# Patient Record
Sex: Female | Born: 1970 | Race: Black or African American | Hispanic: No | Marital: Married | Smoking: Never smoker
Health system: Southern US, Community
[De-identification: ages and names within clinical notes are randomized; demographics above are authoritative.]

## PROBLEM LIST (undated history)

## (undated) DIAGNOSIS — Z803 Family history of malignant neoplasm of breast: Secondary | ICD-10-CM

## (undated) DIAGNOSIS — R519 Headache, unspecified: Secondary | ICD-10-CM

## (undated) DIAGNOSIS — E785 Hyperlipidemia, unspecified: Secondary | ICD-10-CM

## (undated) DIAGNOSIS — Z800 Family history of malignant neoplasm of digestive organs: Secondary | ICD-10-CM

## (undated) DIAGNOSIS — R51 Headache: Secondary | ICD-10-CM

## (undated) DIAGNOSIS — G8929 Other chronic pain: Secondary | ICD-10-CM

## (undated) DIAGNOSIS — R059 Cough, unspecified: Secondary | ICD-10-CM

## (undated) DIAGNOSIS — D649 Anemia, unspecified: Secondary | ICD-10-CM

## (undated) DIAGNOSIS — N949 Unspecified condition associated with female genital organs and menstrual cycle: Secondary | ICD-10-CM

## (undated) DIAGNOSIS — M545 Low back pain, unspecified: Secondary | ICD-10-CM

## (undated) DIAGNOSIS — J309 Allergic rhinitis, unspecified: Secondary | ICD-10-CM

## (undated) DIAGNOSIS — R10811 Right upper quadrant abdominal tenderness: Secondary | ICD-10-CM

## (undated) DIAGNOSIS — R609 Edema, unspecified: Secondary | ICD-10-CM

## (undated) DIAGNOSIS — IMO0002 Reserved for concepts with insufficient information to code with codable children: Secondary | ICD-10-CM

## (undated) DIAGNOSIS — R87619 Unspecified abnormal cytological findings in specimens from cervix uteri: Secondary | ICD-10-CM

## (undated) DIAGNOSIS — E559 Vitamin D deficiency, unspecified: Secondary | ICD-10-CM

## (undated) DIAGNOSIS — R05 Cough: Secondary | ICD-10-CM

## (undated) HISTORY — DX: Edema, unspecified: R60.9

## (undated) HISTORY — PX: TUBAL LIGATION: SHX77

## (undated) HISTORY — DX: Family history of malignant neoplasm of breast: Z80.3

## (undated) HISTORY — DX: Right upper quadrant abdominal tenderness: R10.811

## (undated) HISTORY — PX: VAGINAL HYSTERECTOMY: SUR661

## (undated) HISTORY — DX: Allergic rhinitis, unspecified: J30.9

## (undated) HISTORY — DX: Headache: R51

## (undated) HISTORY — DX: Low back pain, unspecified: M54.50

## (undated) HISTORY — DX: Reserved for concepts with insufficient information to code with codable children: IMO0002

## (undated) HISTORY — DX: Anemia, unspecified: D64.9

## (undated) HISTORY — DX: Family history of malignant neoplasm of digestive organs: Z80.0

## (undated) HISTORY — DX: Cough: R05

## (undated) HISTORY — PX: BREAST LUMPECTOMY: SHX2

## (undated) HISTORY — DX: Vitamin D deficiency, unspecified: E55.9

## (undated) HISTORY — DX: Low back pain: M54.5

## (undated) HISTORY — PX: BREAST EXCISIONAL BIOPSY: SUR124

## (undated) HISTORY — DX: Other chronic pain: G89.29

## (undated) HISTORY — DX: Unspecified abnormal cytological findings in specimens from cervix uteri: R87.619

## (undated) HISTORY — DX: Hyperlipidemia, unspecified: E78.5

## (undated) HISTORY — DX: Headache, unspecified: R51.9

## (undated) HISTORY — DX: Cough, unspecified: R05.9

## (undated) HISTORY — DX: Unspecified condition associated with female genital organs and menstrual cycle: N94.9

---

## 1997-12-08 ENCOUNTER — Inpatient Hospital Stay (HOSPITAL_COMMUNITY): Admission: AD | Admit: 1997-12-08 | Discharge: 1997-12-10 | Payer: Self-pay | Admitting: *Deleted

## 2004-11-03 ENCOUNTER — Ambulatory Visit: Payer: Self-pay | Admitting: Family Medicine

## 2006-07-12 ENCOUNTER — Ambulatory Visit: Payer: Self-pay

## 2009-10-12 ENCOUNTER — Encounter: Payer: Self-pay | Admitting: Sports Medicine

## 2009-11-10 ENCOUNTER — Encounter: Payer: Self-pay | Admitting: Sports Medicine

## 2010-04-13 ENCOUNTER — Ambulatory Visit: Payer: Self-pay | Admitting: Family Medicine

## 2010-07-14 ENCOUNTER — Ambulatory Visit: Payer: Self-pay | Admitting: Family Medicine

## 2010-10-17 ENCOUNTER — Encounter: Payer: Self-pay | Admitting: Podiatry

## 2011-05-24 ENCOUNTER — Ambulatory Visit: Payer: Self-pay | Admitting: General Surgery

## 2011-05-26 ENCOUNTER — Ambulatory Visit: Payer: Self-pay | Admitting: General Surgery

## 2011-09-21 ENCOUNTER — Emergency Department: Payer: Self-pay | Admitting: Internal Medicine

## 2011-09-21 LAB — CK TOTAL AND CKMB (NOT AT ARMC): CK-MB: 1.8 ng/mL (ref 0.5–3.6)

## 2011-09-21 LAB — COMPREHENSIVE METABOLIC PANEL
Alkaline Phosphatase: 59 U/L (ref 50–136)
Anion Gap: 7 (ref 7–16)
BUN: 11 mg/dL (ref 7–18)
Bilirubin,Total: 0.9 mg/dL (ref 0.2–1.0)
Calcium, Total: 8.6 mg/dL (ref 8.5–10.1)
Creatinine: 0.72 mg/dL (ref 0.60–1.30)
Glucose: 75 mg/dL (ref 65–99)
SGOT(AST): 22 U/L (ref 15–37)
Total Protein: 6.8 g/dL (ref 6.4–8.2)

## 2011-09-21 LAB — CBC
HCT: 39.4 % (ref 35.0–47.0)
MCH: 24 pg — ABNORMAL LOW (ref 26.0–34.0)
MCV: 76 fL — ABNORMAL LOW (ref 80–100)
RBC: 5.18 10*6/uL (ref 3.80–5.20)
WBC: 4.2 10*3/uL (ref 3.6–11.0)

## 2011-09-21 LAB — URINALYSIS, COMPLETE
Bacteria: NONE SEEN
Ketone: NEGATIVE
Ph: 7 (ref 4.5–8.0)
Protein: NEGATIVE
RBC,UR: 2 /HPF (ref 0–5)
Squamous Epithelial: 4

## 2011-09-21 LAB — TROPONIN I: Troponin-I: <0.02 ng/mL

## 2011-09-29 ENCOUNTER — Ambulatory Visit: Payer: Self-pay | Admitting: Orthopedic Surgery

## 2012-08-27 ENCOUNTER — Ambulatory Visit: Payer: Self-pay | Admitting: Family Medicine

## 2012-10-11 ENCOUNTER — Emergency Department: Payer: Self-pay | Admitting: Emergency Medicine

## 2012-10-11 LAB — CBC
HCT: 39.6 % (ref 35.0–47.0)
MCH: 23.5 pg — ABNORMAL LOW (ref 26.0–34.0)
MCV: 73 fL — ABNORMAL LOW (ref 80–100)
Platelet: 184 10*3/uL (ref 150–440)
RDW: 14.9 % — ABNORMAL HIGH (ref 11.5–14.5)
WBC: 4.3 10*3/uL (ref 3.6–11.0)

## 2012-10-11 LAB — COMPREHENSIVE METABOLIC PANEL
Albumin: 3.5 g/dL (ref 3.4–5.0)
Alkaline Phosphatase: 65 U/L (ref 50–136)
BUN: 7 mg/dL (ref 7–18)
Bilirubin,Total: 0.9 mg/dL (ref 0.2–1.0)
Calcium, Total: 8.5 mg/dL (ref 8.5–10.1)
Co2: 30 mmol/L (ref 21–32)
EGFR (African American): >60
Glucose: 96 mg/dL (ref 65–99)
Osmolality: 274 (ref 275–301)
SGOT(AST): 27 U/L (ref 15–37)
Sodium: 138 mmol/L (ref 136–145)
Total Protein: 6.9 g/dL (ref 6.4–8.2)

## 2012-10-11 LAB — URINALYSIS, COMPLETE
Bacteria: NONE SEEN
Blood: NEGATIVE
Ketone: NEGATIVE
Ph: 5 (ref 4.5–8.0)
RBC,UR: <1 /HPF (ref 0–5)
Squamous Epithelial: 1
WBC UR: NONE SEEN /HPF (ref 0–5)

## 2012-10-11 LAB — TROPONIN I: Troponin-I: <0.02 ng/mL

## 2012-10-11 LAB — TSH: Thyroid Stimulating Horm: 0.992 u[IU]/mL

## 2013-04-30 ENCOUNTER — Ambulatory Visit: Payer: Self-pay | Admitting: Family Medicine

## 2013-05-13 ENCOUNTER — Emergency Department: Payer: Self-pay | Admitting: Emergency Medicine

## 2013-05-13 LAB — BASIC METABOLIC PANEL
Anion Gap: 5 — ABNORMAL LOW (ref 7–16)
BUN: 9 mg/dL (ref 7–18)
Chloride: 107 mmol/L (ref 98–107)
Creatinine: 0.86 mg/dL (ref 0.60–1.30)
EGFR (African American): >60
EGFR (Non-African Amer.): >60
Glucose: 96 mg/dL (ref 65–99)
Osmolality: 276 (ref 275–301)
Potassium: 3.8 mmol/L (ref 3.5–5.1)
Sodium: 139 mmol/L (ref 136–145)

## 2013-05-13 LAB — CBC
HCT: 37.5 % (ref 35.0–47.0)
HGB: 12.1 g/dL (ref 12.0–16.0)
MCH: 23.8 pg — ABNORMAL LOW (ref 26.0–34.0)
MCHC: 32.3 g/dL (ref 32.0–36.0)
MCV: 74 fL — ABNORMAL LOW (ref 80–100)
RDW: 15.5 % — ABNORMAL HIGH (ref 11.5–14.5)
WBC: 6.9 10*3/uL (ref 3.6–11.0)

## 2013-05-13 LAB — URINALYSIS, COMPLETE
Bacteria: NONE SEEN
Bilirubin,UR: NEGATIVE
Glucose,UR: NEGATIVE mg/dL (ref 0–75)
Leukocyte Esterase: NEGATIVE
Ph: 7 (ref 4.5–8.0)
Protein: NEGATIVE
RBC,UR: 5 /HPF (ref 0–5)
Specific Gravity: 1.006 (ref 1.003–1.030)
WBC UR: 1 /HPF (ref 0–5)

## 2013-05-20 ENCOUNTER — Ambulatory Visit: Payer: Self-pay | Admitting: Cardiovascular Disease

## 2013-05-27 ENCOUNTER — Ambulatory Visit: Payer: Self-pay | Admitting: Cardiovascular Disease

## 2013-06-18 ENCOUNTER — Encounter: Payer: Self-pay | Admitting: *Deleted

## 2013-06-24 ENCOUNTER — Encounter (INDEPENDENT_AMBULATORY_CARE_PROVIDER_SITE_OTHER): Payer: Self-pay

## 2013-06-24 ENCOUNTER — Encounter: Payer: Self-pay | Admitting: Cardiovascular Disease

## 2013-06-24 ENCOUNTER — Ambulatory Visit (INDEPENDENT_AMBULATORY_CARE_PROVIDER_SITE_OTHER): Payer: 59 | Admitting: Cardiovascular Disease

## 2013-06-24 VITALS — BP 137/86 | HR 86 | Ht 66.0 in | Wt 195.5 lb

## 2013-06-24 DIAGNOSIS — R0789 Other chest pain: Secondary | ICD-10-CM

## 2013-06-24 HISTORY — DX: Other chest pain: R07.89

## 2013-06-24 NOTE — Patient Instructions (Signed)
Follow up as needed

## 2013-06-24 NOTE — Progress Notes (Signed)
Primary care physician: Dr. Sherryll Burger.   HPI  This is a pleasant 43 year old African American female who was referred for evaluation of chest pain. She has no previous cardiac history. She has no history of hypertension, diabetes or hyperlipidemia. She is a lifelong nonsmoker. There is no family history of premature coronary artery disease. She had one episode of chest discomfort on December 2. It started in the substernal area and described as gas and burning sensation. She became anxious and sweaty. Her mother was extremely concerned about this. It lasted for a few hours. This happened after she ate a spicy and greasy meal. She went to the emergency room at [facility]. Chest pain resolved without intervention. Labs were unremarkable including negative cardiac enzymes. Chest x-ray showed no acute abnormalities. EKG was within normal limits. She was discharged home. She reports no further episodes of chest discomfort since then. She has been trying to increase her physical activities. She works at [employer].    Allergies  Allergen Reactions  . Erythromycin   . Penicillins   . Vibramycin [Doxycycline Calcium]      Current Outpatient Prescriptions on File Prior to Visit  Medication Sig Dispense Refill  . Vitamin D, Ergocalciferol, (DRISDOL) 50000 UNITS CAPS capsule Take 50,000 Units by mouth every 7 (seven) days.       No current facility-administered medications on file prior to visit.     Past Medical History  Diagnosis Date  . Swelling   . Anemia   . Chronic headaches   . Abdominal tenderness, right upper quadrant   . Cough   . Unspecified symptom associated with female genital organs   . Lumbago   . Unspecified vitamin D deficiency   . Thoracic or lumbosacral neuritis or radiculitis, unspecified   . Allergic rhinitis, cause unspecified   . Other and unspecified hyperlipidemia      Past Surgical History  Procedure Laterality Date  . Vaginal hysterectomy    . Tubal  ligation    . Breast lumpectomy Left      Family History  Problem Relation Age of Onset  . Stroke Mother   . Hypertension Mother   . Heart murmur Mother   . Hypertension Father   . Breast cancer Maternal Aunt      History   Social History  . Marital Status: Single    Spouse Name: N/A    Number of Children: N/A  . Years of Education: N/A   Occupational History  . Not on file.   Social History Main Topics  . Smoking status: Never Smoker   . Smokeless tobacco: Not on file  . Alcohol Use: No  . Drug Use: No  . Sexual Activity: Not on file   Other Topics Concern  . Not on file   Social History Narrative  . No narrative on file     ROS A 10 point review of system was performed. It is negative other than that mentioned in the history of present illness.   PHYSICAL EXAM   BP 137/86  Pulse 86  Ht 5\' 6"  (1.676 m)  Wt 195 lb 8 oz (88.678 kg)  BMI 31.57 kg/m2 Constitutional: She is oriented to person, place, and time. She appears well-developed and well-nourished. No distress.  HENT: No nasal discharge.  Head: Normocephalic and atraumatic.  Eyes: Pupils are equal and round. No discharge.  Neck: Normal range of motion. Neck supple. No JVD present. No thyromegaly present.  Cardiovascular: Normal rate, regular rhythm, normal heart  sounds. Exam reveals no gallop and no friction rub. No murmur heard.  Pulmonary/Chest: Effort normal and breath sounds normal. No stridor. No respiratory distress. She has no wheezes. She has no rales. She exhibits no tenderness.  Abdominal: Soft. Bowel sounds are normal. She exhibits no distension. There is no tenderness. There is no rebound and no guarding.  Musculoskeletal: Normal range of motion. She exhibits no edema and no tenderness.  Neurological: She is alert and oriented to person, place, and time. Coordination normal.  Skin: Skin is warm and dry. No rash noted. She is not diaphoretic. No erythema. No pallor.  Psychiatric: She has  a normal mood and affect. Her behavior is normal. Judgment and thought content normal.        ASSESSMENT AND PLAN

## 2013-06-24 NOTE — Assessment & Plan Note (Signed)
I suspect that her chest pain was likely GI in nature. She had no recurrent episodes even with exertional activities. Cardiac physical exam is unremarkable and baseline ECG is normal. She has no significant risk factors for coronary artery disease. A treadmill stress test can be done if she develops recurrent symptoms. However, her pretest probability for ischemic heart disease is very low. She will notify us if she develops any concerning recurrent symptoms.

## 2013-11-11 ENCOUNTER — Ambulatory Visit: Payer: Self-pay | Admitting: Family Medicine

## 2013-12-12 ENCOUNTER — Encounter: Payer: Self-pay | Admitting: Podiatry

## 2013-12-12 ENCOUNTER — Ambulatory Visit (INDEPENDENT_AMBULATORY_CARE_PROVIDER_SITE_OTHER): Payer: 59

## 2013-12-12 ENCOUNTER — Ambulatory Visit: Payer: 59 | Admitting: Podiatry

## 2013-12-12 ENCOUNTER — Other Ambulatory Visit: Payer: Self-pay | Admitting: *Deleted

## 2013-12-12 VITALS — BP 128/89 | HR 74 | Resp 16 | Ht 66.0 in

## 2013-12-12 DIAGNOSIS — M21619 Bunion of unspecified foot: Secondary | ICD-10-CM

## 2013-12-12 DIAGNOSIS — L600 Ingrowing nail: Secondary | ICD-10-CM

## 2013-12-12 DIAGNOSIS — M21611 Bunion of right foot: Secondary | ICD-10-CM

## 2013-12-12 DIAGNOSIS — B351 Tinea unguium: Secondary | ICD-10-CM

## 2013-12-12 DIAGNOSIS — M779 Enthesopathy, unspecified: Secondary | ICD-10-CM

## 2013-12-12 MED ORDER — TRIAMCINOLONE ACETONIDE 10 MG/ML IJ SUSP
10.0000 mg | Freq: Once | INTRAMUSCULAR | Status: AC
  Administered 2013-12-12: 10 mg

## 2013-12-12 MED ORDER — HYDROCODONE-ACETAMINOPHEN 10-325 MG PO TABS: 1.0000 | ORAL_TABLET | Freq: Three times a day (TID) | ORAL | Status: DC | PRN

## 2013-12-12 NOTE — Patient Instructions (Signed)

## 2013-12-12 NOTE — Progress Notes (Signed)
Subjective:     Patient ID: Debra Stephens, female   DOB: April 01, 1971, 43 y.o.   MRN: [id number]  HPI patient presents stating that her left big toenail has been bothering her for a long time and that the right big toe joint has become sore secondary to an injury with inflammation noted. Also complains of some discoloration in the hallux nailbeds of both feet   Review of Systems  All other systems reviewed and are negative.      Objective:   Physical Exam  Nursing note and vitals reviewed. Constitutional: She is oriented to person, place, and time.  Cardiovascular: Intact distal pulses.   Musculoskeletal: Normal range of motion.  Neurological: She is oriented to person, place, and time.  Skin: Skin is warm.   neurovascular status intact with range of motion subtalar midtarsal joint adequate and muscle strength within normal limits. Left hallux lateral border is incurvated with what appears to be scar tissue and possible nail that has returned secondary to having been done a number of years ago by another physician. Patient's right first MPJ is inflamed and painful when I try to move it with fluid around the metatarsal phalangeal joint     Assessment:     Ingrown toenail deformity left hallux with scar tissue formation and mycotic infection of a mild nature and probable capsulitis and inflammation of the right first MPJ secondary to injury    Plan:     H&P and x-ray right reviewed. Discussed both problems and for the left foot discussed correction of the corner but explained scar tissue may recur again. She wants to have this done and today I infiltrated 60 mg Xylocaine Marcaine mixture removed crusted tissue from the lateral side exposed the matrix after removing a small amount of nail spicule and apply chemical phenol 3 applications 30 seconds followed by alcohol lavaged. For the right I did do a careful capsular injection of the right first MPJ 3 mg Kenalog 5 mg I can Marcaine mixture to  reduce the inflammation

## 2013-12-12 NOTE — Progress Notes (Signed)
   Subjective:    Patient ID: Debra Stephens, female    DOB: [birth year], 43 y.o.   MRN: [id number]  HPI Comments: Both great toenails i think have fungus , they are thick discolored and thick and raising up. It has been going since April of this year.  It is pretty painful , the right hallux is real sore, on wed i stepped out of the shower wrong and it has been real sore since      Review of Systems  HENT:       Sinus problems   Eyes: Positive for redness and itching.  Gastrointestinal: Positive for constipation.  Musculoskeletal: Positive for back pain.  Allergic/Immunologic: Positive for environmental allergies.  Neurological: Positive for headaches.  All other systems reviewed and are negative.      Objective:   Physical Exam        Assessment & Plan:

## 2013-12-17 ENCOUNTER — Ambulatory Visit (INDEPENDENT_AMBULATORY_CARE_PROVIDER_SITE_OTHER): Payer: 59 | Admitting: Podiatry

## 2013-12-17 VITALS — BP 136/87 | HR 86 | Resp 16

## 2013-12-17 DIAGNOSIS — L600 Ingrowing nail: Secondary | ICD-10-CM

## 2013-12-17 NOTE — Progress Notes (Signed)
She presents today less than one week status post matrixectomy hallux right. She states that this toe swells, on it it work on a home it doesn't swell. He really doesn't like to wear tight shoes. She denies soaking the foot states that she poor Betadine directly on the wound wife's with a cottonball. She denies fever chills nausea vomiting muscle aches pains or red streaks of the foot. She states that the toe drains.  Objective: Vital signs are stable she is alert and oriented x3. Pulses are palpable right foot. The hallux right is mildly edematous there is no erythema tali this drainage or odor granulation tissue is present from the matrixectomy was performed. It appears to be healing. I see no signs of infection currently.   Assessment: Matrixectomy tibial border hallux right x5 days.  Plan: Explained to her how to soak in Betadine and water for the next 2-3 days. And I suggested that she start soaking in Epsom salts in warm water. I dispensed a Darco shoe and will followup with her in 2 weeks

## 2013-12-17 NOTE — Patient Instructions (Signed)

## 2015-04-05 ENCOUNTER — Other Ambulatory Visit: Payer: Self-pay | Admitting: Family Medicine

## 2015-04-05 DIAGNOSIS — Z111 Encounter for screening for respiratory tuberculosis: Secondary | ICD-10-CM

## 2015-04-05 LAB — TB SKIN TEST
Induration: 0.5 mm
TB SKIN TEST: NEGATIVE

## 2015-08-29 ENCOUNTER — Other Ambulatory Visit: Payer: Self-pay | Admitting: Family Medicine

## 2015-09-07 ENCOUNTER — Encounter: Payer: Self-pay | Admitting: Emergency Medicine

## 2015-09-07 ENCOUNTER — Emergency Department
Admission: EM | Admit: 2015-09-07 | Discharge: 2015-09-07 | Disposition: A | Discharge status: Home / Self Care | Payer: BLUE CROSS/BLUE SHIELD | Attending: Emergency Medicine | Admitting: Emergency Medicine

## 2015-09-07 DIAGNOSIS — R51 Headache: Secondary | ICD-10-CM | POA: Insufficient documentation

## 2015-09-07 DIAGNOSIS — L299 Pruritus, unspecified: Secondary | ICD-10-CM

## 2015-09-07 DIAGNOSIS — G8929 Other chronic pain: Secondary | ICD-10-CM | POA: Diagnosis not present

## 2015-09-07 DIAGNOSIS — E785 Hyperlipidemia, unspecified: Secondary | ICD-10-CM | POA: Insufficient documentation

## 2015-09-07 DIAGNOSIS — R220 Localized swelling, mass and lump, head: Secondary | ICD-10-CM | POA: Diagnosis not present

## 2015-09-07 MED ORDER — PREDNISONE 20 MG PO TABS: 60.0000 mg | ORAL_TABLET | Freq: Every day | ORAL | Status: DC

## 2015-09-07 MED ORDER — ACETAMINOPHEN 500 MG PO TABS
1000.0000 mg | ORAL_TABLET | Freq: Once | ORAL | Status: AC
  Administered 2015-09-07: 1000 mg via ORAL

## 2015-09-07 MED ORDER — ACETAMINOPHEN 500 MG PO TABS
ORAL_TABLET | ORAL | Status: AC
  Administered 2015-09-07: 1000 mg via ORAL
  Filled 2015-09-07: qty 2

## 2015-09-07 MED ORDER — PREDNISONE 20 MG PO TABS
60.0000 mg | ORAL_TABLET | Freq: Once | ORAL | Status: AC
  Administered 2015-09-07: 60 mg via ORAL
  Filled 2015-09-07: qty 3

## 2015-09-07 MED ORDER — DIPHENHYDRAMINE HCL 25 MG PO CAPS: 25.0000 mg | ORAL_CAPSULE | Freq: Three times a day (TID) | ORAL | Status: DC | PRN

## 2015-09-07 MED ORDER — EPINEPHRINE 0.3 MG/0.3ML IJ SOAJ: 0.3000 mg | Freq: Once | INTRAMUSCULAR | Status: DC

## 2015-09-07 MED ORDER — FAMOTIDINE 40 MG PO TABS: 40.0000 mg | ORAL_TABLET | Freq: Every day | ORAL | Status: DC

## 2015-09-07 MED ORDER — FAMOTIDINE 20 MG PO TABS
40.0000 mg | ORAL_TABLET | Freq: Every day | ORAL | Status: DC
  Administered 2015-09-07: 40 mg via ORAL
  Filled 2015-09-07: qty 2

## 2015-09-07 MED ORDER — DIPHENHYDRAMINE HCL 25 MG PO CAPS
25.0000 mg | ORAL_CAPSULE | Freq: Three times a day (TID) | ORAL | Status: DC | PRN
  Filled 2015-09-07: qty 1

## 2015-09-07 NOTE — ED Notes (Signed)
Woke up to facial swelling this am, states she was concerned it was her bp, however, this am, 138/81, has been having hives intermittently, has had facial swelling in the past and was seen here for treatment. Obvious facial and eye swelling. Is scheduled to see the allergist on Friday.

## 2015-09-07 NOTE — ED Provider Notes (Signed)
[facility] Emergency Department Provider Note  ____________________________________________  Time seen: Approximately 7:37 AM  I have reviewed the triage vital signs and the nursing notes.   HISTORY  Chief Complaint Facial Swelling    HPI Chandra A Dot Been is a 45 y.o. female w/ long history of allergic reactions presenting with swelling of the face. Patient describes that she has had allergies to numerous allergens since the age of 48. She has recently been evaluated by an allergist due to increasing episodes of hives and facial swelling, and had provocative testing showing allergic response to multiple things including pineapple, affable, pollen, dust mites, cats and dogs. As far she knows, she did not have exposure to any allergens in the last couple of days, but this morning she woke up with swelling in the upper eyelids, both lips, and lower face with associated burning sensation of the lips. She denies any rash but has a itching sensation on the face. She denies any shortness of breath or difficulty swallowing. She denies any lightheadedness or syncope. She has recently been started on Zantac when necessary, but has not had any doses for at least 4 days. No other new foods. The patient is not on any antihypertensives or Ace inhibitors.   Past Medical History  Diagnosis Date  . Swelling   . Anemia   . Chronic headaches   . Abdominal tenderness, right upper quadrant   . Cough   . Unspecified symptom associated with female genital organs   . Lumbago   . Unspecified vitamin D deficiency   . Thoracic or lumbosacral neuritis or radiculitis, unspecified   . Allergic rhinitis, cause unspecified   . Other and unspecified hyperlipidemia     Patient Active Problem List   Diagnosis Date Noted  . Atypical chest pain 06/24/2013    Past Surgical History  Procedure Laterality Date  . Vaginal hysterectomy    . Tubal ligation    . Breast lumpectomy Left      Current Outpatient Rx  Name  Route  Sig  Dispense  Refill  . acetaminophen (TYLENOL) 325 MG tablet   Oral   Take 650 mg by mouth as needed.         . diphenhydrAMINE (BENADRYL) 25 mg capsule   Oral   Take 1 capsule (25 mg total) by mouth every 8 (eight) hours as needed for allergies.   20 capsule   0   . EPINEPHrine 0.3 mg/0.3 mL IJ SOAJ injection   Intramuscular   Inject 0.3 mLs (0.3 mg total) into the muscle once.   1 Device   0   . ergocalciferol (VITAMIN D2) 50000 UNITS capsule   Oral   Take by mouth.         . famotidine (PEPCID) 40 MG tablet   Oral   Take 1 tablet (40 mg total) by mouth daily.   5 tablet   0   . fluticasone (FLONASE) 50 MCG/ACT nasal spray               . GUAIFENESIN PO   Oral   Take by mouth.         Marland Kitchen HYDROcodone-acetaminophen (NORCO) 10-325 MG per tablet   Oral   Take 1 tablet by mouth every 8 (eight) hours as needed.   30 tablet   0   . predniSONE (DELTASONE) 20 MG tablet   Oral   Take 3 tablets (60 mg total) by mouth daily.   15 tablet  0   . Vitamin D, Ergocalciferol, (DRISDOL) 50000 UNITS CAPS capsule   Oral   Take 50,000 Units by mouth every 7 (seven) days.           Allergies Erythromycin; Penicillins; and Vibramycin  Family History  Problem Relation Age of Onset  . Stroke Mother   . Hypertension Mother   . Heart murmur Mother   . Hypertension Father   . Breast cancer Maternal Aunt     Social History Social History  Substance Use Topics  . Smoking status: Never Smoker   . Smokeless tobacco: None  . Alcohol Use: No    Review of Systems Constitutional: No fever/chills. No lightheadedness or syncope. Eyes: No visual changes. No blurred or double vision. Positive swelling of the upper eyelids. ENT: No sore throat. No congestion or rhinorrhea. Positive burning sensation of the lips. Positive swelling of the lips and lower face. Cardiovascular: Denies chest pain. Denies palpitations. Respiratory:  Denies shortness of breath.  No cough. Gastrointestinal: No abdominal pain.  No nausea, no vomiting.  No diarrhea.  No constipation. Genitourinary: Negative for dysuria. Musculoskeletal: Negative for back pain. Skin: Negative for rash. Neurological: Negative for headaches. No focal numbness, tingling or weakness.   10-point ROS otherwise negative.  ____________________________________________   PHYSICAL EXAM:  VITAL SIGNS: ED Triage Vitals  Enc Vitals Group     BP 09/07/15 0713 138/81 mmHg     Pulse Rate 09/07/15 0713 59     Resp 09/07/15 0713 18     Temp 09/07/15 0713 98.4 F (36.9 C)     Temp Source 09/07/15 0713 Oral     SpO2 09/07/15 0713 98 %     Weight 09/07/15 0713 192 lb (87.091 kg)     Height 09/07/15 0713  (1.676 m)     Head Cir --      Peak Flow --      Pain Score --      Pain Loc --      Pain Edu? --      Excl. in GC? --     Constitutional: Alert and oriented. Well appearing and in no acute distress. Answers questions appropriately. Eyes: Conjunctivae are normal.  EOMI. No scleral icterus. Mild swelling of the upper eyelids with almost normal opening of the eyes. No overlying erythema. Head: Atraumatic. No submandibular fullness. Nose: No congestion/rhinnorhea. Mouth/Throat: Mild swelling of the upper and lower lips symmetrically. No swelling of the tongue, palate. No trismus, stridor. No evidence of swelling of the posterior palate, uvula is midline and also not swollen. Neck: No stridor.  Supple.  No lymphadenopathy. Cardiovascular: Normal rate, regular rhythm. No murmurs, rubs or gallops.  Respiratory: Normal respiratory effort.  No accessory muscle use or retractions. Lungs CTAB.  No wheezes, rales or ronchi. Musculoskeletal: No LE edema. No ttp in the calves or palpable cords.  Negative Homan's sign. Neurologic:  A&Ox3.  Speech is clear.  Face and smile are symmetric.  EOMI.  Moves all extremities well. Skin:  Skin is warm, dry and intact. No rash  noted. Psychiatric: Mood and affect are normal. Speech and behavior are normal.  Normal judgement.  ____________________________________________   LABS (all labs ordered are listed, but only abnormal results are displayed)  Labs Reviewed - No data to display ____________________________________________  EKG  Not indicated ____________________________________________  RADIOLOGY  No results found.  ____________________________________________   PROCEDURES  Procedure(s) performed: None  Critical Care performed: No ____________________________________________   INITIAL IMPRESSION / ASSESSMENT AND  PLAN / ED COURSE  Pertinent labs & imaging results that were available during my care of the patient were reviewed by me and considered in my medical decision making (see chart for details).  45 y.o. female with a long history of allergic reactions presenting with swelling of the lips, mandible, and upper eyelids without any respiratory or airway compromise. At this time, the swelling appears to be limited to the face and the patient is not in any extremities. She has successfully been treated with steroids in the past, so will initiate steroids, antihistamines and an H2 blocker.  I anticipate discharge, and the patient already has an appointment with her allergist on Friday.  Return precautions and follow up instructions were discussed.  ----------------------------------------- 8:21 AM on 09/07/2015 -----------------------------------------  The patient reports that her lips are no longer burning. Her upper lip is less swollen and her eyelids are no longer swollen. She continues to be hemodynamically stable and does not have any evidence of new respiratory or airway compromise. I will plan discharge home with allergist follow-up on Friday.  ____________________________________________  FINAL CLINICAL IMPRESSION(S) / ED DIAGNOSES  Final diagnoses:  Facial swelling  Itching       NEW MEDICATIONS STARTED DURING THIS VISIT:  New Prescriptions   DIPHENHYDRAMINE (BENADRYL) 25 MG CAPSULE    Take 1 capsule (25 mg total) by mouth every 8 (eight) hours as needed for allergies.   EPINEPHRINE 0.3 MG/0.3 ML IJ SOAJ INJECTION    Inject 0.3 mLs (0.3 mg total) into the muscle once.   FAMOTIDINE (PEPCID) 40 MG TABLET    Take 1 tablet (40 mg total) by mouth daily.   PREDNISONE (DELTASONE) 20 MG TABLET    Take 3 tablets (60 mg total) by mouth daily.     Rockne MenghiniAnne-Caroline Malcome Ambrocio, MD 09/07/15 [phone]

## 2015-09-07 NOTE — ED Notes (Signed)
Pt woke up with facial swelling, which she states she has had in the past.  Pt is currently having allergy testing done as an outpatient.  Swelling is present in the lips, bilateral eyes, cheeks and chin, but worse on the right.  No wheezing or other signs of respiratory distress present.

## 2015-09-07 NOTE — Discharge Instructions (Signed)
°  Please take the entire course of steroids, even if you're feeling better.  Return to the emergency department if you develop difficulty breathing, difficulty swallowing, increasing swelling, lightheadedness or fainting, or any other symptoms concerning to you.  Allergies An allergy is an abnormal reaction to a substance by the body's defense system (immune system). Allergies can develop at any age. WHAT CAUSES ALLERGIES? An allergic reaction happens when the immune system mistakenly reacts to a normally harmless substance, called an allergen, as if it were harmful. The immune system releases antibodies to fight the substance. Antibodies eventually release a chemical called histamine into the bloodstream. The release of histamine is meant to protect the body from infection, but it also causes discomfort. An allergic reaction can be triggered by:  Eating an allergen.  Inhaling an allergen.  Touching an allergen. WHAT TYPES OF ALLERGIES ARE THERE? There are many types of allergies. Common types include:  Seasonal allergies. People with this type of allergy are usually allergic to substances that are only present during certain seasons, such as molds and pollens.  Food allergies.  Drug allergies.  Insect allergies.  Animal dander allergies. WHAT ARE SYMPTOMS OF ALLERGIES? Possible allergy symptoms include:  Swelling of the lips, face, tongue, mouth, or throat.  Sneezing, coughing, or wheezing.  Nasal congestion.  Tingling in the mouth.  Rash.  Itching.  Itchy, red, swollen areas of skin (hives).  Watery eyes.  Vomiting.  Diarrhea.  Dizziness.  Lightheadedness.  Fainting.  Trouble breathing or swallowing.  Chest tightness.  Rapid heartbeat. HOW ARE ALLERGIES DIAGNOSED? Allergies are diagnosed with a medical and family history and one or more of the following:  Skin tests.  Blood tests.  A food diary. A food diary is a record of all the foods and drinks  you have in a day and of all the symptoms you experience.  The results of an elimination diet. An elimination diet involves eliminating foods from your diet and then adding them back in one by one to find out if a certain food causes an allergic reaction. HOW ARE ALLERGIES TREATED? There is no cure for allergies, but allergic reactions can be treated with medicine. Severe reactions usually need to be treated at a hospital. HOW CAN REACTIONS BE PREVENTED? The best way to prevent an allergic reaction is by avoiding the substance you are allergic to. Allergy shots and medicines can also help prevent reactions in some cases. People with severe allergic reactions may be able to prevent a life-threatening reaction called anaphylaxis with a medicine given right after exposure to the allergen.   This information is not intended to replace advice given to you by your health care provider. Make sure you discuss any questions you have with your health care provider.   Document Released: 08/22/2002 Document Revised: 06/19/2014 Document Reviewed: 03/10/2014 Elsevier Interactive Patient Education [employer]2016 [employer]

## 2015-09-08 DIAGNOSIS — T783XXA Angioneurotic edema, initial encounter: Secondary | ICD-10-CM | POA: Insufficient documentation

## 2015-09-08 DIAGNOSIS — Z872 Personal history of diseases of the skin and subcutaneous tissue: Secondary | ICD-10-CM | POA: Insufficient documentation

## 2015-09-09 ENCOUNTER — Ambulatory Visit: Payer: Self-pay | Admitting: Family Medicine

## 2016-03-02 ENCOUNTER — Ambulatory Visit (INDEPENDENT_AMBULATORY_CARE_PROVIDER_SITE_OTHER): Payer: BLUE CROSS/BLUE SHIELD | Admitting: Family Medicine

## 2016-03-02 ENCOUNTER — Encounter: Payer: Self-pay | Admitting: Family Medicine

## 2016-03-02 DIAGNOSIS — A09 Infectious gastroenteritis and colitis, unspecified: Secondary | ICD-10-CM

## 2016-03-02 DIAGNOSIS — J0100 Acute maxillary sinusitis, unspecified: Secondary | ICD-10-CM | POA: Diagnosis not present

## 2016-03-02 MED ORDER — MOXIFLOXACIN HCL 400 MG PO TABS: 400.0000 mg | ORAL_TABLET | Freq: Every day | ORAL | 0 refills | Status: DC

## 2016-03-02 NOTE — Progress Notes (Signed)
Name: Debra Stephens   MRN: [id number]    DOB: February 24, 1971   Date:03/02/2016       Progress Note  Subjective  Chief Complaint  Chief Complaint  Patient presents with  . Acute Visit  . Nausea  . Chills  . Diarrhea  . Fever  . Sinus Problem    Diarrhea   This is a new problem. The current episode started 1 to 4 weeks ago (2 weeks). The problem occurs 2 to 4 times per day. The stool consistency is described as watery. Associated symptoms include chills, coughing, headaches and myalgias. Risk factors include ill contacts (grand-daughter is sick).  Sinus Problem  This is a new problem. The current episode started in the past 7 days. The problem is unchanged. There has been no fever (elevated temperature to 63F). Associated symptoms include chills, congestion, coughing, headaches and a sore throat. Past treatments include nothing (Has been taking vitamins, drinking hot tea and honey.).     Past Medical History:  Diagnosis Date  . Abdominal tenderness, right upper quadrant   . Allergic rhinitis, cause unspecified   . Anemia   . Chronic headaches   . Cough   . Lumbago   . Other and unspecified hyperlipidemia   . Swelling   . Thoracic or lumbosacral neuritis or radiculitis, unspecified   . Unspecified symptom associated with female genital organs   . Unspecified vitamin D deficiency     Past Surgical History:  Procedure Laterality Date  . BREAST LUMPECTOMY Left   . TUBAL LIGATION    . VAGINAL HYSTERECTOMY      Family History  Problem Relation Age of Onset  . Stroke Mother   . Hypertension Mother   . Heart murmur Mother   . Hypertension Father   . Breast cancer Maternal Aunt     Social History   Social History  . Marital status: Single    Spouse name: N/A  . Number of children: N/A  . Years of education: N/A   Occupational History  . Not on file.   Social History Main Topics  . Smoking status: Never Smoker  . Smokeless tobacco: Never Used  . Alcohol use No  .  Drug use: No  . Sexual activity: Not on file   Other Topics Concern  . Not on file   Social History Narrative  . No narrative on file     Current Outpatient Prescriptions:  .  EPINEPHrine 0.3 mg/0.3 mL IJ SOAJ injection, Inject 0.3 mLs (0.3 mg total) into the muscle once., Disp: 1 Device, Rfl: 0 .  fluticasone (FLONASE) 50 MCG/ACT nasal spray, , Disp: , Rfl:  .  acetaminophen (TYLENOL) 325 MG tablet, Take 650 mg by mouth as needed., Disp: , Rfl:  .  diphenhydrAMINE (BENADRYL) 25 mg capsule, Take 1 capsule (25 mg total) by mouth every 8 (eight) hours as needed for allergies. (Patient not taking: Reported on 03/02/2016), Disp: 20 capsule, Rfl: 0 .  ergocalciferol (VITAMIN D2) 50000 UNITS capsule, Take by mouth., Disp: , Rfl:  .  famotidine (PEPCID) 40 MG tablet, Take 1 tablet (40 mg total) by mouth daily. (Patient not taking: Reported on 03/02/2016), Disp: 5 tablet, Rfl: 0 .  GUAIFENESIN PO, Take by mouth., Disp: , Rfl:  .  HYDROcodone-acetaminophen (NORCO) 10-325 MG per tablet, Take 1 tablet by mouth every 8 (eight) hours as needed. (Patient not taking: Reported on 03/02/2016), Disp: 30 tablet, Rfl: 0 .  predniSONE (DELTASONE) 20 MG tablet, Take 3 tablets (  60 mg total) by mouth daily. (Patient not taking: Reported on 03/02/2016), Disp: 15 tablet, Rfl: 0 .  Vitamin D, Ergocalciferol, (DRISDOL) 50000 UNITS CAPS capsule, Take 50,000 Units by mouth every 7 (seven) days., Disp: , Rfl:   Allergies  Allergen Reactions  . Erythromycin Other (See Comments)  . Ibuprofen Hives  . Penicillins Other (See Comments)  . Vibramycin [Doxycycline Calcium] Hives     Review of Systems  Constitutional: Positive for chills.  HENT: Positive for congestion and sore throat.   Respiratory: Positive for cough.   Gastrointestinal: Positive for diarrhea.  Musculoskeletal: Positive for myalgias.  Neurological: Positive for headaches.    Objective  Vitals:   03/02/16 0909  BP: 123/74  Pulse: 85  Resp: 16   Temp: 99 F (37.2 C)  TempSrc: Oral  SpO2: 96%  Weight: 195 lb 6.4 oz (88.6 kg)  Height: 5\' 6"  (1.676 m)    Physical Exam  Constitutional: She is oriented to person, place, and time and well-developed, well-nourished, and in no distress.  HENT:  Head: Normocephalic and atraumatic.  Right Ear: Tympanic membrane and ear canal normal.  Nose: Right sinus exhibits maxillary sinus tenderness. Left sinus exhibits maxillary sinus tenderness.  Mouth/Throat: Oropharynx is clear and moist. No posterior oropharyngeal erythema.  Left ear canal and TM normal, fluid level visible.  Cardiovascular: Normal rate, regular rhythm, S1 normal, S2 normal and normal heart sounds.   No murmur heard. Pulmonary/Chest: Breath sounds normal. She has no decreased breath sounds. She has no wheezes. She has no rhonchi.  Abdominal: Soft. Bowel sounds are normal. There is tenderness in the periumbilical area.  Lower abdominal tenderness to palpation.  Neurological: She is alert and oriented to person, place, and time.  Psychiatric: Mood, memory, affect and judgment normal.  Nursing note and vitals reviewed.     Assessment & Plan  1. Acute non-recurrent maxillary sinusitis Patient cannot take penicillins, Macrolides and doxycycline. We'll start on moxifloxacin for maxillary sinusitis. - moxifloxacin (AVELOX) 400 MG tablet; Take 1 tablet (400 mg total) by mouth daily at 8 pm.  Dispense: 5 tablet; Refill: 0  2. Acute infectious nonbacterial gastroenteritis Usually self-limiting and should resolve without intervention. Encouraged to stay hydrated.    Debra Stephens Asad [facility] 03/02/2016 9:21 AM

## 2016-03-09 ENCOUNTER — Ambulatory Visit: Payer: BLUE CROSS/BLUE SHIELD | Admitting: Family Medicine

## 2016-05-16 ENCOUNTER — Ambulatory Visit: Payer: BLUE CROSS/BLUE SHIELD | Admitting: Family Medicine

## 2016-05-17 ENCOUNTER — Encounter: Payer: Self-pay | Admitting: Family Medicine

## 2016-05-17 ENCOUNTER — Ambulatory Visit (INDEPENDENT_AMBULATORY_CARE_PROVIDER_SITE_OTHER): Payer: BLUE CROSS/BLUE SHIELD | Admitting: Family Medicine

## 2016-05-17 VITALS — BP 122/70 | HR 96 | Temp 98.3°F | Resp 16 | Ht 66.0 in | Wt 191.4 lb

## 2016-05-17 DIAGNOSIS — R059 Cough, unspecified: Secondary | ICD-10-CM

## 2016-05-17 DIAGNOSIS — R05 Cough: Secondary | ICD-10-CM | POA: Diagnosis not present

## 2016-05-17 DIAGNOSIS — J0100 Acute maxillary sinusitis, unspecified: Secondary | ICD-10-CM

## 2016-05-17 MED ORDER — BENZONATATE 200 MG PO CAPS: 200.0000 mg | ORAL_CAPSULE | Freq: Three times a day (TID) | ORAL | 0 refills | Status: DC | PRN

## 2016-05-17 NOTE — Progress Notes (Signed)
Name: Debra Stephens   MRN: [id number]    DOB: 02/10/1971   Date:05/17/2016       Progress Note  Subjective  Chief Complaint  Chief Complaint  Patient presents with  . URI    cough, congested, [facility]    Cough  This is a new problem. The current episode started 1 to 4 weeks ago ( 2.5 weeks ago). The cough is productive of sputum. Associated symptoms include postnasal drip. Pertinent negatives include no ear pain (ears feel as if they are full), fever, headaches, nasal congestion or sore throat. Treatments tried: Prescribed anti-tussive antibiotic and nasal spray for relief.     Past Medical History:  Diagnosis Date  . Abdominal tenderness, right upper quadrant   . Allergic rhinitis, cause unspecified   . Anemia   . Chronic headaches   . Cough   . Lumbago   . Other and unspecified hyperlipidemia   . Swelling   . Thoracic or lumbosacral neuritis or radiculitis, unspecified   . Unspecified symptom associated with female genital organs   . Unspecified vitamin D deficiency     Past Surgical History:  Procedure Laterality Date  . BREAST LUMPECTOMY Left   . TUBAL LIGATION    . VAGINAL HYSTERECTOMY      Family History  Problem Relation Age of Onset  . Stroke Mother   . Hypertension Mother   . Heart murmur Mother   . Hypertension Father   . Breast cancer Maternal Aunt     Social History   Social History  . Marital status: Single    Spouse name: N/A  . Number of children: N/A  . Years of education: N/A   Occupational History  . Not on file.   Social History Main Topics  . Smoking status: Never Smoker  . Smokeless tobacco: Never Used  . Alcohol use No  . Drug use: No  . Sexual activity: Not on file   Other Topics Concern  . Not on file   Social History Narrative  . No narrative on file     Current Outpatient Prescriptions:  .  acetaminophen (TYLENOL) 325 MG tablet, Take 650 mg by mouth as needed., Disp: , Rfl:  .  diphenhydrAMINE  (BENADRYL) 25 mg capsule, Take 1 capsule (25 mg total) by mouth every 8 (eight) hours as needed for allergies., Disp: 20 capsule, Rfl: 0 .  famotidine (PEPCID) 40 MG tablet, Take 1 tablet (40 mg total) by mouth daily., Disp: 5 tablet, Rfl: 0 .  fluticasone (FLONASE) 50 MCG/ACT nasal spray, , Disp: , Rfl:   Allergies  Allergen Reactions  . Erythromycin Other (See Comments)  . Ibuprofen Hives  . Penicillins Other (See Comments)  . Vibramycin [Doxycycline Calcium] Hives     Review of Systems  Constitutional: Negative for fever.  HENT: Positive for postnasal drip. Negative for ear pain (ears feel as if they are full) and sore throat.   Respiratory: Positive for cough.   Neurological: Negative for headaches.    Objective  Vitals:   05/17/16 0908  BP: 122/70  Pulse: 96  Resp: 16  Temp: 98.3 F (36.8 C)  TempSrc: Oral  SpO2: 97%  Weight: 191 lb 6.4 oz (86.8 kg)  Height: 5\' 6"  (1.676 m)    Physical Exam  Constitutional: She is oriented to person, place, and time and well-developed, well-nourished, and in no distress.  HENT:  Right Ear: Tympanic membrane and ear canal normal. No drainage.  Left  Ear: Tympanic membrane and ear canal normal.  Nose: Right sinus exhibits maxillary sinus tenderness. Left sinus exhibits maxillary sinus tenderness.  Mouth/Throat: No posterior oropharyngeal erythema.  Nasal mucosal inflammation, turbinate hypertrophy.  Cardiovascular: Normal rate, regular rhythm, S1 normal, S2 normal and normal heart sounds.   No murmur heard. Pulmonary/Chest: Effort normal and breath sounds normal. She has no wheezes.  Neurological: She is alert and oriented to person, place, and time.  Psychiatric: Mood, memory, affect and judgment normal.  Nursing note and vitals reviewed.     Assessment & Plan  1. Acute non-recurrent maxillary sinusitis Has finished a course of moxifloxacin, no further antibiotics indicated, advised to rinse her nose and increase fluid  intake, symptoms should resolve with time  2. Cough We'll start on benzonatate for relief of coughing, likely caused by postnasal drainage. - benzonatate (TESSALON) 200 MG capsule; Take 1 capsule (200 mg total) by mouth 3 (three) times daily as needed for cough.  Dispense: 21 capsule; Refill: 0   Ezrie Bunyan [facility]ad [facility] 05/17/2016 9:24 AM

## 2016-05-24 DIAGNOSIS — R768 Other specified abnormal immunological findings in serum: Secondary | ICD-10-CM | POA: Insufficient documentation

## 2016-10-11 ENCOUNTER — Other Ambulatory Visit: Payer: Self-pay | Admitting: Obstetrics & Gynecology

## 2016-11-13 ENCOUNTER — Telehealth: Payer: Self-pay

## 2016-11-13 NOTE — Telephone Encounter (Signed)
Appt here tomorrow to check exam first

## 2016-11-13 NOTE — Telephone Encounter (Signed)
Do you need to see pt first?

## 2016-11-13 NOTE — Telephone Encounter (Signed)
Pt calling requesting PH [facility] dx [facility] for her as she found a lump in her right breast over the weekend.  Please call her asap.  [phone] (c) or (w) [phone]

## 2016-11-14 ENCOUNTER — Encounter: Payer: Self-pay | Admitting: Obstetrics & Gynecology

## 2016-11-14 ENCOUNTER — Ambulatory Visit (INDEPENDENT_AMBULATORY_CARE_PROVIDER_SITE_OTHER): Payer: Managed Care, Other (non HMO) | Admitting: Obstetrics & Gynecology

## 2016-11-14 VITALS — BP 130/80 | HR 81 | Ht 66.0 in | Wt 191.0 lb

## 2016-11-14 DIAGNOSIS — N6310 Unspecified lump in the right breast, unspecified quadrant: Secondary | ICD-10-CM

## 2016-11-14 NOTE — Progress Notes (Signed)
HPI:      [name] is a 46 y.o. No obstetric history on file. who is premenopausal, presents today for a problem visit.  She complains of breast lump on the rightside which she first noticed three days ago.  It has not significantly changed.  Associated symptoms include [facility] but worse this weekend and mass hurts more after she has messed with it than before.  Denies nipple discharge or skin changes.  No fever.  Prior Mammogram: normal. Prior breast problems: No Family History: Breast Cancer-relatedfamily history includes Breast cancer in her maternal aunt.  PMHx: She  has a past medical history of Abdominal tenderness, right upper quadrant; Allergic rhinitis, cause unspecified; Anemia; Chronic headaches; Cough; Lumbago; Other and unspecified hyperlipidemia; Swelling; Thoracic or lumbosacral neuritis or radiculitis, unspecified; Unspecified symptom associated with female genital organs; and Unspecified vitamin D deficiency. Also,  has a past surgical history that includes Vaginal hysterectomy; Tubal ligation; and Breast lumpectomy (Left)., family history includes Breast cancer in her maternal aunt; Heart murmur in her mother; Hypertension in her father and mother; Stroke in her mother.,  reports that she has never smoked. She has never used smokeless tobacco. She reports that she does not drink alcohol or use drugs.  She has a current medication list which includes the following prescription(s): acetaminophen, benzonatate, diphenhydramine, famotidine, and fluticasone. Also, is allergic to erythromycin; ibuprofen; penicillins; and vibramycin [doxycycline calcium].  Review of Systems  Constitutional: Negative for chills, fever and malaise/fatigue.  HENT: Negative for congestion, sinus pain and sore throat.   Eyes: Negative for blurred vision and pain.  Respiratory: Negative for cough and wheezing.   Cardiovascular: Negative for chest pain and leg swelling.    Gastrointestinal: Negative for abdominal pain, constipation, diarrhea, heartburn, nausea and vomiting.  Genitourinary: Negative for dysuria, frequency, hematuria and urgency.  Musculoskeletal: Negative for back pain, joint pain, myalgias and neck pain.  Skin: Negative for itching and rash.  Neurological: Negative for dizziness, tremors and weakness.  Endo/Heme/Allergies: Does not bruise/bleed easily.  Psychiatric/Behavioral: Negative for depression. The patient is not nervous/anxious and does not have insomnia.     Objective: BP 130/80   Pulse 81   Ht 5\' 6"  (1.676 m)   Wt 191 lb (86.6 kg)   BMI 30.83 kg/m  Physical Exam  Constitutional: She is oriented to person, place, and time. She appears well-developed and well-nourished. No distress.  Cardiovascular: Normal rate, regular rhythm, normal heart sounds and normal pulses.  Exam reveals no gallop and no friction rub.   No murmur heard. Pulmonary/Chest: Effort normal and breath sounds normal. She exhibits no mass, no tenderness and no edema. Right breast exhibits mass and tenderness. Right breast exhibits no inverted nipple, no nipple discharge and no skin change. Left breast exhibits no inverted nipple, no mass, no nipple discharge, no skin change and no tenderness. There is no breast swelling.  RIGHT BREAST TENDERNESS DIFFUSELY and PALPABLE 0.5 CM TENDER NODULE at 1 o'clock position  Abdominal: Normal appearance.  Musculoskeletal: Normal range of motion.  Lymphadenopathy:    She has no axillary adenopathy.       Right axillary: No pectoral and no lateral adenopathy present.       Left axillary: No pectoral and no lateral adenopathy present. Neurological: She is alert and oriented to person, place, and time.  Skin: Skin is warm and dry. No abrasion, no bruising, no lesion and no rash noted. No erythema.  Psychiatric: She has  a normal mood and affect. Her speech is normal and behavior is normal. Judgment normal.  Vitals  reviewed.   ASSESSMENT/PLAN: breast mass 1. Breast mass, right - MM DIAG BREAST TOMO UNI RIGHT; Future, desires at [facility] - US BREAST LTD UNI RIGHT INC AXILLA; Future May be [place] or hormonal changes due to recent steroid use; monitor sx's of pain as well.  Annamarie Major, MD, [facility], [facility] 11/14/2016  5:19 PM

## 2016-11-22 DIAGNOSIS — E559 Vitamin D deficiency, unspecified: Secondary | ICD-10-CM | POA: Insufficient documentation

## 2016-12-05 ENCOUNTER — Telehealth: Payer: Self-pay | Admitting: Obstetrics & Gynecology

## 2016-12-05 NOTE — Telephone Encounter (Signed)
Lets sch time for refill and re-examination

## 2016-12-05 NOTE — Telephone Encounter (Signed)
Pt has questions about the [facility] during [facility] breast exam, on the right side near arm pit, should she be worried when the mammo was negative and the Radiology didn't see any thing? Also wants refill on Phentermine does she need apt?

## 2016-12-05 NOTE — Telephone Encounter (Signed)
Normal/negative MMG/US.  Radiology recommends f/u testing in one year. Let her know.

## 2016-12-05 NOTE — Telephone Encounter (Signed)
Pt is calling to find out about her Mammogram and right breast ultrasound. Please advise patient.

## 2016-12-06 NOTE — Telephone Encounter (Signed)
Spoke with Patient about scheduling patient reports she is transferring care  [facility] will [facility] to schedule appointment when she needs it

## 2016-12-06 NOTE — Telephone Encounter (Signed)
Can you call pt and schedule a appt for weight check for rx and breast exam with [facility].

## 2017-01-05 ENCOUNTER — Ambulatory Visit: Payer: BLUE CROSS/BLUE SHIELD | Admitting: Family Medicine

## 2017-01-29 ENCOUNTER — Ambulatory Visit: Payer: Self-pay | Admitting: Obstetrics & Gynecology

## 2017-07-10 ENCOUNTER — Encounter: Payer: Self-pay | Admitting: Family Medicine

## 2017-07-10 ENCOUNTER — Ambulatory Visit
Admission: RE | Admit: 2017-07-10 | Discharge: 2017-07-10 | Disposition: A | Discharge status: Home / Self Care | Payer: BLUE CROSS/BLUE SHIELD | Source: Ambulatory Visit | Attending: Family Medicine | Admitting: Family Medicine

## 2017-07-10 ENCOUNTER — Ambulatory Visit: Payer: BLUE CROSS/BLUE SHIELD | Admitting: Family Medicine

## 2017-07-10 ENCOUNTER — Encounter: Payer: BLUE CROSS/BLUE SHIELD | Admitting: Family Medicine

## 2017-07-10 VITALS — BP 120/60 | HR 89 | Temp 98.5°F | Resp 16 | Ht 66.0 in | Wt 193.8 lb

## 2017-07-10 DIAGNOSIS — J0110 Acute frontal sinusitis, unspecified: Secondary | ICD-10-CM

## 2017-07-10 DIAGNOSIS — T3695XA Adverse effect of unspecified systemic antibiotic, initial encounter: Secondary | ICD-10-CM | POA: Diagnosis not present

## 2017-07-10 DIAGNOSIS — M549 Dorsalgia, unspecified: Secondary | ICD-10-CM

## 2017-07-10 DIAGNOSIS — B379 Candidiasis, unspecified: Secondary | ICD-10-CM | POA: Diagnosis not present

## 2017-07-10 MED ORDER — FLUCONAZOLE 150 MG PO TABS: 150.0000 mg | ORAL_TABLET | Freq: Once | ORAL | 0 refills | Status: AC

## 2017-07-10 MED ORDER — MOXIFLOXACIN HCL 400 MG PO TABS: 400.0000 mg | ORAL_TABLET | Freq: Every day | ORAL | 0 refills | Status: AC

## 2017-07-10 NOTE — Progress Notes (Signed)
Name: Debra Stephens   MRN: [id number]    DOB: 1970/08/15   Date:07/10/2017       Progress Note  Subjective  Chief Complaint  Chief Complaint  Patient presents with  . Headache  . Nausea  . Back Pain    Back Pain  This is a new problem. The current episode started yesterday. The pain is present in the thoracic spine and lumbar spine. The pain is at a severity of 7/10. Associated symptoms include headaches (new onset since yesterday but has history of chronic headaches.). Pertinent negatives include no abdominal pain, dysuria, fever, pelvic pain or tingling.    Past Medical History:  Diagnosis Date  . Abdominal tenderness, right upper quadrant   . Allergic rhinitis, cause unspecified   . Anemia   . Chronic headaches   . Cough   . Lumbago   . Other and unspecified hyperlipidemia   . Swelling   . Thoracic or lumbosacral neuritis or radiculitis, unspecified   . Unspecified symptom associated with female genital organs   . Unspecified vitamin D deficiency     Past Surgical History:  Procedure Laterality Date  . BREAST LUMPECTOMY Left   . TUBAL LIGATION    . VAGINAL HYSTERECTOMY      Family History  Problem Relation Age of Onset  . Stroke Mother   . Hypertension Mother   . Heart murmur Mother   . Hypertension Father   . Breast cancer Maternal Aunt     Social History   Socioeconomic History  . Marital status: Single    Spouse name: Not on file  . Number of children: Not on file  . Years of education: Not on file  . Highest education level: Not on file  Social Needs  . Financial resource strain: Not on file  . Food insecurity - worry: Not on file  . Food insecurity - inability: Not on file  . Transportation needs - medical: Not on file  . Transportation needs - non-medical: Not on file  Occupational History  . Not on file  Tobacco Use  . Smoking status: Never Smoker  . Smokeless tobacco: Never Used  Substance and Sexual Activity  . Alcohol use: No  . Drug  use: No  . Sexual activity: Not on file  Other Topics Concern  . Not on file  Social History Narrative  . Not on file     Current Outpatient Medications:  .  acetaminophen (TYLENOL) 325 MG tablet, Take 650 mg by mouth as needed., Disp: , Rfl:  .  diphenhydrAMINE (BENADRYL) 25 mg capsule, Take 1 capsule (25 mg total) by mouth every 8 (eight) hours as needed for allergies., Disp: 20 capsule, Rfl: 0 .  famotidine (PEPCID) 40 MG tablet, Take 1 tablet (40 mg total) by mouth daily., Disp: 5 tablet, Rfl: 0 .  fluticasone (FLONASE) 50 MCG/ACT nasal spray, , Disp: , Rfl:  .  benzonatate (TESSALON) 200 MG capsule, Take 1 capsule (200 mg total) by mouth 3 (three) times daily as needed for cough. (Patient not taking: Reported on 07/10/2017), Disp: 21 capsule, Rfl: 0  Allergies  Allergen Reactions  . Erythromycin Other (See Comments)  . Ibuprofen Hives  . Penicillins Other (See Comments)  . Vibramycin [Doxycycline Calcium] Hives     Review of Systems  Constitutional: Negative for fever.  Gastrointestinal: Negative for abdominal pain.  Genitourinary: Negative for dysuria and pelvic pain.  Musculoskeletal: Positive for back pain.  Neurological: Positive for headaches (new onset since yesterday  but has history of chronic headaches.). Negative for tingling.     Objective  Vitals:   07/10/17 1151  BP: 120/60  Pulse: 89  Resp: 16  Temp: 98.5 F (36.9 C)  TempSrc: Oral  SpO2: 99%  Weight: 193 lb 12.8 oz (87.9 kg)  Height: 5\' 6"  (1.676 m)    Physical Exam  Constitutional: She is oriented to person, place, and time and well-developed, well-nourished, and in no distress.  HENT:  Head: Normocephalic and atraumatic.  Right Ear: Tympanic membrane and ear canal normal.  Left Ear: Tympanic membrane and ear canal normal.  Nose: Right sinus exhibits maxillary sinus tenderness and frontal sinus tenderness. Left sinus exhibits maxillary sinus tenderness and frontal sinus tenderness.    Mouth/Throat: No posterior oropharyngeal erythema.  Cardiovascular: Normal rate, regular rhythm, S1 normal, S2 normal and normal heart sounds.  No murmur heard. Pulmonary/Chest: Effort normal and breath sounds normal. No respiratory distress. She has no wheezes (single wheeze in left lower lung field). She has no rhonchi.  Musculoskeletal:       Thoracic back: She exhibits tenderness. She exhibits no pain and no spasm.       Back:  Neurological: She is alert and oriented to person, place, and time.  Nursing note and vitals reviewed.     Assessment & Plan  1. Acute non-recurrent frontal sinusitis Start on moxifloxacin for treatment of sinusitis - moxifloxacin (AVELOX) 400 MG tablet; Take 1 tablet (400 mg total) by mouth daily at 8 pm for 7 days.  Dispense: 7 tablet; Refill: 0  2. Acute upper back pain Rule out pneumonia, obtain chest x-ray - DG Chest 2 View; Future  3. Antibiotic-induced yeast infection  - fluconazole (DIFLUCAN) 150 MG tablet; Take 1 tablet (150 mg total) by mouth once for 1 dose.  Dispense: 2 tablet; Refill: 0   Esti Demello Asad [facility] 07/10/2017 12:10 PM

## 2017-07-13 ENCOUNTER — Encounter: Payer: BLUE CROSS/BLUE SHIELD | Admitting: Family Medicine

## 2017-07-26 ENCOUNTER — Encounter: Payer: Self-pay | Admitting: Obstetrics & Gynecology

## 2017-07-27 ENCOUNTER — Encounter: Payer: Self-pay | Admitting: Obstetrics & Gynecology

## 2017-07-27 ENCOUNTER — Encounter: Payer: Self-pay | Admitting: Family Medicine

## 2017-07-27 ENCOUNTER — Ambulatory Visit (INDEPENDENT_AMBULATORY_CARE_PROVIDER_SITE_OTHER): Payer: BLUE CROSS/BLUE SHIELD | Admitting: Obstetrics & Gynecology

## 2017-07-27 ENCOUNTER — Ambulatory Visit (INDEPENDENT_AMBULATORY_CARE_PROVIDER_SITE_OTHER): Payer: BLUE CROSS/BLUE SHIELD | Admitting: Family Medicine

## 2017-07-27 VITALS — BP 124/82 | HR 89 | Temp 98.1°F | Resp 14 | Ht 66.0 in | Wt 192.7 lb

## 2017-07-27 VITALS — BP 128/80 | HR 86 | Ht 66.0 in | Wt 194.0 lb

## 2017-07-27 DIAGNOSIS — E669 Obesity, unspecified: Secondary | ICD-10-CM | POA: Diagnosis not present

## 2017-07-27 DIAGNOSIS — Z0000 Encounter for general adult medical examination without abnormal findings: Secondary | ICD-10-CM

## 2017-07-27 DIAGNOSIS — Z1211 Encounter for screening for malignant neoplasm of colon: Secondary | ICD-10-CM

## 2017-07-27 DIAGNOSIS — Z23 Encounter for immunization: Secondary | ICD-10-CM

## 2017-07-27 DIAGNOSIS — Z0001 Encounter for general adult medical examination with abnormal findings: Secondary | ICD-10-CM | POA: Diagnosis not present

## 2017-07-27 DIAGNOSIS — Z87411 Personal history of vaginal dysplasia: Secondary | ICD-10-CM | POA: Insufficient documentation

## 2017-07-27 DIAGNOSIS — Z1231 Encounter for screening mammogram for malignant neoplasm of breast: Secondary | ICD-10-CM | POA: Diagnosis not present

## 2017-07-27 DIAGNOSIS — Z1239 Encounter for other screening for malignant neoplasm of breast: Secondary | ICD-10-CM

## 2017-07-27 DIAGNOSIS — L819 Disorder of pigmentation, unspecified: Secondary | ICD-10-CM

## 2017-07-27 MED ORDER — PHENTERMINE HCL 37.5 MG PO TABS: 37.5000 mg | ORAL_TABLET | Freq: Every day | ORAL | 0 refills | Status: DC

## 2017-07-27 MED ORDER — FLUCONAZOLE 150 MG PO TABS: 150.0000 mg | ORAL_TABLET | Freq: Once | ORAL | 6 refills | Status: AC

## 2017-07-27 MED ORDER — FLUCONAZOLE 150 MG PO TABS: 150.0000 mg | ORAL_TABLET | Freq: Once | ORAL | 6 refills | Status: DC

## 2017-07-27 NOTE — Patient Instructions (Addendum)
PAP every 5 years Mammogram every year    Call [phone] to schedule at [facility] yearly (with PCP)  Phentermine tablets or capsules What is this medicine? PHENTERMINE (FEN ter meen) decreases your appetite. It is used with a reduced calorie diet and exercise to help you lose weight. This medicine may be used for other purposes; ask your health care provider or pharmacist if you have questions. COMMON BRAND NAME(S): Adipex-P, Atti-Plex P, Atti-Plex P Spansule, Fastin, Lomaira, Pro-Fast, Tara-8 What should I tell my health care provider before I take this medicine? They need to know if you have any of these conditions: -agitation -glaucoma -heart disease -high blood pressure -history of substance abuse -lung disease called Primary Pulmonary Hypertension (PPH) -taken an MAOI like Carbex, Eldepryl, Marplan, Nardil, or Parnate in last 14 days -thyroid disease -an unusual or allergic reaction to phentermine, other medicines, foods, dyes, or preservatives -pregnant or trying to get pregnant -breast-feeding How should I use this medicine? Take this medicine by mouth with a glass of water. Follow the directions on the prescription label. The instructions for use may differ based on the product and dose you are taking. Avoid taking this medicine in the evening. It may interfere with sleep. Take your doses at regular intervals. Do not take your medicine more often than directed. Talk to your pediatrician regarding the use of this medicine in children. While this drug may be prescribed for children 17 years or older for selected conditions, precautions do apply. Overdosage: If you think you have taken too much of this medicine contact a poison control center or emergency room at once. NOTE: This medicine is only for you. Do not share this medicine with others. What if I miss a dose? If you miss a dose, take it as soon as you can. If it is almost time for your next dose, take only that dose.  Do not take double or extra doses. What may interact with this medicine? Do not take this medicine with any of the following medications: -duloxetine -MAOIs like Carbex, Eldepryl, Marplan, Nardil, and Parnate -medicines for colds or breathing difficulties like pseudoephedrine or phenylephrine -procarbazine -sibutramine -SSRIs like citalopram, escitalopram, fluoxetine, fluvoxamine, paroxetine, and sertraline -stimulants like dexmethylphenidate, methylphenidate or modafinil -venlafaxine This medicine may also interact with the following medications: -medicines for diabetes This list may not describe all possible interactions. Give your health care provider a list of all the medicines, herbs, non-prescription drugs, or dietary supplements you use. Also tell them if you smoke, drink alcohol, or use illegal drugs. Some items may interact with your medicine. What should I watch for while using this medicine? Notify your physician immediately if you become short of breath while doing your normal activities. Do not take this medicine within 6 hours of bedtime. It can keep you from getting to sleep. Avoid drinks that contain caffeine and try to stick to a regular bedtime every night. This medicine was intended to be used in addition to a healthy diet and exercise. The best results are achieved this way. This medicine is only indicated for short-term use. Eventually your weight loss may level out. At that point, the drug will only help you maintain your new weight. Do not increase or in any way change your dose without consulting your doctor. You may get drowsy or dizzy. Do not drive, use machinery, or do anything that needs mental alertness until you know how this medicine affects you. Do not stand or sit up quickly, especially if you  are an older patient. This reduces the risk of dizzy or fainting spells. Alcohol may increase dizziness and drowsiness. Avoid alcoholic drinks. What side effects may I notice  from receiving this medicine? Side effects that you should report to your doctor or health care professional as soon as possible: -chest pain, palpitations -depression or severe changes in mood -increased blood pressure -irritability -nervousness or restlessness -severe dizziness -shortness of breath -problems urinating -unusual swelling of the legs -vomiting Side effects that usually do not require medical attention (report to your doctor or health care professional if they continue or are bothersome): -blurred vision or other eye problems -changes in sexual ability or desire -constipation or diarrhea -difficulty sleeping -dry mouth or unpleasant taste -headache -nausea This list may not describe all possible side effects. Call your doctor for medical advice about side effects. You may report side effects to FDA at 1-800-FDA-1088. Where should I keep my medicine? Keep out of the reach of children. This medicine can be abused. Keep your medicine in a safe place to protect it from theft. Do not share this medicine with anyone. Selling or giving away this medicine is dangerous and against the law. This medicine may cause accidental overdose and death if taken by other adults, children, or pets. Mix any unused medicine with a substance like cat litter or coffee grounds. Then throw the medicine away in a sealed container like a sealed bag or a coffee can with a lid. Do not use the medicine after the expiration date. Store at room temperature between 20 and 25 degrees C (68 and 77 degrees F). Keep container tightly closed. NOTE: This sheet is a summary. It may not cover all possible information. If you have questions about this medicine, talk to your doctor, pharmacist, or health care provider.  2018 Elsevier/Gold Standard (2015-03-05 12:53:15)

## 2017-07-27 NOTE — Progress Notes (Signed)
Name: Debra GathersMachelle A Dark   MRN: [id number]    DOB: March 27, 1971   Date:07/27/2017       Progress Note  Subjective  Chief Complaint  Chief Complaint  Patient presents with  . Annual Exam    HPI  Pt. presents for Annual Physical Exam. She is being followed by Gynecology for female exams and screenings.  She is due for routine lab work.    Past Medical History:  Diagnosis Date  . Abdominal tenderness, right upper quadrant   . Abnormal Pap smear of cervix   . Allergic rhinitis, cause unspecified   . Anemia   . Chronic headaches   . Cough   . Lumbago   . Other and unspecified hyperlipidemia   . Swelling   . Thoracic or lumbosacral neuritis or radiculitis, unspecified   . Unspecified symptom associated with female genital organs   . Unspecified vitamin D deficiency     Past Surgical History:  Procedure Laterality Date  . BREAST LUMPECTOMY Left   . TUBAL LIGATION    . VAGINAL HYSTERECTOMY      Family History  Problem Relation Age of Onset  . Stroke Mother   . Hypertension Mother   . Heart murmur Mother   . Hypertension Father   . Breast cancer Maternal Aunt   . Lupus Sister   . Lupus Daughter   . Liver cancer Maternal Grandfather   . Cirrhosis Maternal Grandfather   . Hypertension Paternal Grandmother   . Sickle cell anemia Sister   . Thyroid cancer Sister   . Seizures Sister   . Hashimoto's thyroiditis Sister   . Fibromyalgia Sister     Social History   Socioeconomic History  . Marital status: Single    Spouse name: Not on file  . Number of children: Not on file  . Years of education: Not on file  . Highest education level: Not on file  Social Needs  . Financial resource strain: Not on file  . Food insecurity - worry: Not on file  . Food insecurity - inability: Not on file  . Transportation needs - medical: Not on file  . Transportation needs - non-medical: Not on file  Occupational History  . Not on file  Tobacco Use  . Smoking status: Never Smoker    . Smokeless tobacco: Never Used  Substance and Sexual Activity  . Alcohol use: No  . Drug use: No  . Sexual activity: Yes  Other Topics Concern  . Not on file  Social History Narrative  . Not on file     Current Outpatient Medications:  .  acetaminophen (TYLENOL) 325 MG tablet, Take 650 mg by mouth as needed., Disp: , Rfl:  .  diphenhydrAMINE (BENADRYL) 25 mg capsule, Take 1 capsule (25 mg total) by mouth every 8 (eight) hours as needed for allergies., Disp: 20 capsule, Rfl: 0 .  fluticasone (FLONASE) 50 MCG/ACT nasal spray, Place 2 sprays into both nostrils daily. , Disp: , Rfl:  .  Multiple Vitamin (MULTIVITAMIN WITH MINERALS) TABS tablet, Take 1 tablet by mouth daily., Disp: , Rfl:  .  benzonatate (TESSALON) 200 MG capsule, Take 1 capsule (200 mg total) by mouth 3 (three) times daily as needed for cough. (Patient not taking: Reported on 07/10/2017), Disp: 21 capsule, Rfl: 0 .  famotidine (PEPCID) 40 MG tablet, Take 1 tablet (40 mg total) by mouth daily. (Patient not taking: Reported on 07/27/2017), Disp: 5 tablet, Rfl: 0  Allergies  Allergen Reactions  . Erythromycin  Other (See Comments)  . Ibuprofen Hives  . Penicillins Other (See Comments)  . Vibramycin [Doxycycline Calcium] Hives     Review of Systems  Constitutional: Positive for malaise/fatigue. Negative for chills, fever and weight loss.  HENT: Negative for congestion, ear pain (occasional left ear pain.), nosebleeds (1 episode of nose bleed a few days ago), sinus pain and sore throat.   Eyes: Negative for blurred vision and double vision.  Respiratory: Negative for cough (from drainage), sputum production, shortness of breath and wheezing.   Cardiovascular: Negative for chest pain, palpitations and leg swelling.  Gastrointestinal: Positive for constipation. Negative for abdominal pain, blood in stool, diarrhea, nausea and vomiting.  Genitourinary: Negative for dysuria, frequency, hematuria and urgency.  Musculoskeletal:  Negative for back pain and neck pain.  Neurological: Negative for dizziness and headaches.  Psychiatric/Behavioral: Negative for depression. The patient is not nervous/anxious and does not have insomnia.      Objective  Vitals:   07/27/17 0907  BP: 124/82  Pulse: 89  Resp: 14  Temp: 98.1 F (36.7 C)  TempSrc: Oral  SpO2: 99%  Weight: 192 lb 11.2 oz (87.4 kg)  Height: 5\' 6"  (1.676 m)    Physical Exam  Constitutional: She is oriented to person, place, and time and well-developed, well-nourished, and in no distress.  HENT:  Head: Normocephalic and atraumatic.  Right Ear: External ear normal.  Left Ear: External ear normal.  Eyes: Pupils are equal, round, and reactive to light.  Neck: Neck supple.  Cardiovascular: Normal rate, regular rhythm and normal heart sounds.  No murmur heard. Pulmonary/Chest: Effort normal and breath sounds normal.  Abdominal: Soft. Bowel sounds are normal. There is no tenderness.  Musculoskeletal: She exhibits no edema.  Neurological: She is alert and oriented to person, place, and time.  Skin: Skin is warm and dry. Rash noted. Rash is macular.     Macular-hypopigmented lesion on left upper arm.   Psychiatric: Mood, memory, affect and judgment normal.  Nursing note and vitals reviewed.      Assessment & Plan  1. Needs flu shot  - Flu Vaccine QUAD 6+ mos PF IM (Fluarix Quad PF)  2. Well woman exam (no gynecological exam) Obtain age-appropriate laboratory screenings - CBC with Differential/Platelet - COMPLETE METABOLIC PANEL WITH GFR - Lipid panel - TSH - VITAMIN D 25 Hydroxy (Vit-D Deficiency, Fractures) - HgB A1c  3. Hypopigmented skin lesion  - Ambulatory referral to Dermatology  Rito Lecomte Asad A. [facility] 07/27/2017 9:22 AM

## 2017-07-27 NOTE — Progress Notes (Signed)
HPI:      [name] is a 47 y.o. No obstetric history on file. who LMP was in the past, she presents today for her annual examination.  The patient has no complaints today. The patient is sexually active. Herlast pap: approximate date 2016 LGSIL, 2017 ASCUS, 2018 and was normal and last mammogram: approximate date 2018 and was normal.  The patient does perform self breast exams.  There is notable family history of breast or ovarian cancer in her family; pt is BRCA NEG. The patient is not taking hormone replacement therapy. Patient denies post-menopausal vaginal bleeding.   The patient has regular exercise: yes. The patient denies current symptoms of depression.    Weight gain a concern.   Freq yeast infection as spouse has DM and gives them to her. Stress from last year (mother stroke, daughter w neonatal loss)  PMHx: Past Medical History:  Diagnosis Date  . Abdominal tenderness, right upper quadrant   . Abnormal Pap smear of cervix   . Allergic rhinitis, cause unspecified   . Anemia   . Chronic headaches   . Cough   . Lumbago   . Other and unspecified hyperlipidemia   . Swelling   . Thoracic or lumbosacral neuritis or radiculitis, unspecified   . Unspecified symptom associated with female genital organs   . Unspecified vitamin D deficiency    Past Surgical History:  Procedure Laterality Date  . BREAST LUMPECTOMY Left   . TUBAL LIGATION    . VAGINAL HYSTERECTOMY     Family History  Problem Relation Age of Onset  . Stroke Mother   . Hypertension Mother   . Heart murmur Mother   . Hypertension Father   . Breast cancer Maternal Aunt   . Lupus Sister   . Lupus Daughter   . Liver cancer Maternal Grandfather   . Cirrhosis Maternal Grandfather   . Hypertension Paternal Grandmother   . Sickle cell anemia Sister   . Thyroid cancer Sister   . Seizures Sister   . Hashimoto's thyroiditis Sister   . Fibromyalgia Sister    Social History   Tobacco Use  . Smoking  status: Never Smoker  . Smokeless tobacco: Never Used  Substance Use Topics  . Alcohol use: No  . Drug use: No    Current Outpatient Medications:  .  acetaminophen (TYLENOL) 325 MG tablet, Take 650 mg by mouth as needed., Disp: , Rfl:  .  benzonatate (TESSALON) 200 MG capsule, Take 1 capsule (200 mg total) by mouth 3 (three) times daily as needed for cough. (Patient not taking: Reported on 07/10/2017), Disp: 21 capsule, Rfl: 0 .  diphenhydrAMINE (BENADRYL) 25 mg capsule, Take 1 capsule (25 mg total) by mouth every 8 (eight) hours as needed for allergies., Disp: 20 capsule, Rfl: 0 .  famotidine (PEPCID) 40 MG tablet, Take 1 tablet (40 mg total) by mouth daily. (Patient not taking: Reported on 07/27/2017), Disp: 5 tablet, Rfl: 0 .  fluconazole (DIFLUCAN) 150 MG tablet, Take 1 tablet (150 mg total) by mouth once for 1 dose. May repeat in 7 days if symptoms persist, Disp: 3 tablet, Rfl: 6 .  fluticasone (FLONASE) 50 MCG/ACT nasal spray, Place 2 sprays into both nostrils daily. , Disp: , Rfl:  .  Multiple Vitamin (MULTIVITAMIN WITH MINERALS) TABS tablet, Take 1 tablet by mouth daily., Disp: , Rfl:  .  phentermine (ADIPEX-P) 37.5 MG tablet, Take 1 tablet (37.5 mg total) by mouth daily before breakfast., Disp: 30 tablet,  Rfl: 0 Allergies: Erythromycin; Ibuprofen; Penicillins; and Vibramycin [doxycycline calcium]  Review of Systems  Constitutional: Negative for chills, fever and malaise/fatigue.  HENT: Negative for congestion, sinus pain and sore throat.   Eyes: Negative for blurred vision and pain.  Respiratory: Negative for cough and wheezing.   Cardiovascular: Negative for chest pain and leg swelling.  Gastrointestinal: Negative for abdominal pain, constipation, diarrhea, heartburn, nausea and vomiting.  Genitourinary: Negative for dysuria, frequency, hematuria and urgency.  Musculoskeletal: Negative for back pain, joint pain, myalgias and neck pain.  Skin: Negative for itching and rash.    Neurological: Negative for dizziness, tremors and weakness.  Endo/Heme/Allergies: Does not bruise/bleed easily.  Psychiatric/Behavioral: Negative for depression. The patient is not nervous/anxious and does not have insomnia.     Objective: BP 128/80   Pulse 86   Ht 5' 6"  (1.676 m)   Wt 194 lb (88 kg)   BMI 31.31 kg/m   Filed Weights   07/27/17 1346  Weight: 194 lb (88 kg)   Body mass index is 31.31 kg/m. Physical Exam  Constitutional: She is oriented to person, place, and time. She appears well-developed and well-nourished. No distress.  Genitourinary: Rectum normal and vagina normal. Pelvic exam was performed with patient supine. There is no rash or lesion on the right labia. There is no rash or lesion on the left labia. Vagina exhibits no lesion. No bleeding in the vagina. Right adnexum does not display mass and does not display tenderness. Left adnexum does not display mass and does not display tenderness.  Genitourinary Comments: Absent Uterus Absent cervix Vaginal cuff well healed  HENT:  Head: Normocephalic and atraumatic. Head is without laceration.  Right Ear: Hearing normal.  Left Ear: Hearing normal.  Nose: No epistaxis.  No foreign bodies.  Mouth/Throat: Uvula is midline, oropharynx is clear and moist and mucous membranes are normal.  Eyes: Pupils are equal, round, and reactive to light.  Neck: Normal range of motion. Neck supple. No thyromegaly present.  Cardiovascular: Normal rate and regular rhythm. Exam reveals no gallop and no friction rub.  No murmur heard. Pulmonary/Chest: Effort normal and breath sounds normal. No respiratory distress. She has no wheezes. Right breast exhibits no mass, no skin change and no tenderness. Left breast exhibits no mass, no skin change and no tenderness.  Abdominal: Soft. Bowel sounds are normal. She exhibits no distension. There is no tenderness. There is no rebound.  Musculoskeletal: Normal range of motion.  Neurological: She is  alert and oriented to person, place, and time. No cranial nerve deficit.  Skin: Skin is warm and dry.  Psychiatric: She has a normal mood and affect. Judgment normal.  Vitals reviewed.   Assessment: Annual Exam 1. Annual physical exam   2. Obesity (BMI 30-39.9)   3. History of vaginal dysplasia   4. Screening for breast cancer   5. Screen for colon cancer    Plan:            1.  Cervical Screening-  Pap smear done today  2. Breast screening- Exam annually and mammogram scheduled  3. Colonoscopy every 10 years (referral as she is AA and 47 yo), Hemoccult testing after age 17  4. Labs managed by PCP  5. Counseling for hormonal therapy: none  6. Obesity, weight loss discussed, lifestyle measures and meds.  7. Diflucan for chronic yeast    F/U  Return in about 4 weeks (around 08/24/2017) for Follow up.  Barnett Applebaum, MD, [facility], [facility] 07/27/2017  3:28 PM

## 2017-07-28 LAB — CBC WITH DIFFERENTIAL/PLATELET
Basophils Absolute: 28 cells/uL (ref 0–200)
Basophils Relative: 0.7 %
EOS ABS: 60 {cells}/uL (ref 15–500)
Eosinophils Relative: 1.5 %
HCT: 38.7 % (ref 35.0–45.0)
Hemoglobin: 12.3 g/dL (ref 11.7–15.5)
Lymphs Abs: 1644 cells/uL (ref 850–3900)
MCH: 23 pg — AB (ref 27.0–33.0)
MCHC: 31.8 g/dL — ABNORMAL LOW (ref 32.0–36.0)
MCV: 72.5 fL — AB (ref 80.0–100.0)
MPV: 11.5 fL (ref 7.5–12.5)
Monocytes Relative: 15.1 %
NEUTROS PCT: 41.6 %
Neutro Abs: 1664 cells/uL (ref 1500–7800)
PLATELETS: 199 10*3/uL (ref 140–400)
RBC: 5.34 10*6/uL — AB (ref 3.80–5.10)
RDW: 14.1 % (ref 11.0–15.0)
TOTAL LYMPHOCYTE: 41.1 %
WBC: 4 10*3/uL (ref 3.8–10.8)
WBCMIX: 604 {cells}/uL (ref 200–950)

## 2017-07-28 LAB — VITAMIN D 25 HYDROXY (VIT D DEFICIENCY, FRACTURES): Vit D, 25-Hydroxy: 14 ng/mL — ABNORMAL LOW (ref 30–100)

## 2017-07-28 LAB — COMPLETE METABOLIC PANEL WITH GFR
AG RATIO: 1.8 (calc) (ref 1.0–2.5)
ALKALINE PHOSPHATASE (APISO): 58 U/L (ref 33–115)
ALT: 11 U/L (ref 6–29)
AST: 18 U/L (ref 10–35)
Albumin: 4.1 g/dL (ref 3.6–5.1)
BILIRUBIN TOTAL: 1.1 mg/dL (ref 0.2–1.2)
BUN: 11 mg/dL (ref 7–25)
CHLORIDE: 104 mmol/L (ref 98–110)
CO2: 29 mmol/L (ref 20–32)
Calcium: 8.8 mg/dL (ref 8.6–10.2)
Creat: 0.84 mg/dL (ref 0.50–1.10)
GFR, Est African American: 97 mL/min/{1.73_m2} (ref >=60)
GFR, Est Non African American: 83 mL/min/{1.73_m2} (ref >=60)
Globulin: 2.3 g/dL (calc) (ref 1.9–3.7)
Glucose, Bld: 79 mg/dL (ref 65–99)
Potassium: 3.8 mmol/L (ref 3.5–5.3)
Sodium: 138 mmol/L (ref 135–146)
Total Protein: 6.4 g/dL (ref 6.1–8.1)

## 2017-07-28 LAB — LIPID PANEL
Cholesterol: 192 mg/dL (ref <200)
HDL: 54 mg/dL (ref >50)
LDL Cholesterol (Calc): 123 mg/dL (calc) — ABNORMAL HIGH
Non-HDL Cholesterol (Calc): 138 mg/dL (calc) — ABNORMAL HIGH (ref <130)
TRIGLYCERIDES: 55 mg/dL (ref <150)
Total CHOL/HDL Ratio: 3.6 (calc) (ref <5.0)

## 2017-07-28 LAB — HEMOGLOBIN A1C
EAG (MMOL/L): 4.9 (calc)
Hgb A1c MFr Bld: 4.7 % of total Hgb (ref <5.7)
Mean Plasma Glucose: 88 (calc)

## 2017-07-28 LAB — TSH: TSH: 0.73 mIU/L

## 2017-07-30 ENCOUNTER — Other Ambulatory Visit: Payer: Self-pay | Admitting: Family Medicine

## 2017-07-30 MED ORDER — VITAMIN D (ERGOCALCIFEROL) 1.25 MG (50000 UNIT) PO CAPS: 50000.0000 [IU] | ORAL_CAPSULE | ORAL | 0 refills | Status: DC

## 2017-08-01 LAB — PAP IG (IMAGE GUIDED): PAP SMEAR COMMENT: 0

## 2017-08-08 ENCOUNTER — Ambulatory Visit: Payer: BLUE CROSS/BLUE SHIELD | Admitting: Family Medicine

## 2017-08-08 ENCOUNTER — Encounter: Payer: Self-pay | Admitting: Family Medicine

## 2017-08-08 ENCOUNTER — Encounter: Payer: Self-pay | Admitting: Emergency Medicine

## 2017-08-08 VITALS — BP 120/82 | HR 83 | Temp 97.6°F | Resp 16 | Ht 66.0 in | Wt 191.7 lb

## 2017-08-08 DIAGNOSIS — R52 Pain, unspecified: Secondary | ICD-10-CM | POA: Diagnosis not present

## 2017-08-08 DIAGNOSIS — J029 Acute pharyngitis, unspecified: Secondary | ICD-10-CM | POA: Diagnosis not present

## 2017-08-08 DIAGNOSIS — R0981 Nasal congestion: Secondary | ICD-10-CM

## 2017-08-08 LAB — POCT INFLUENZA A/B
Influenza A, POC: NEGATIVE
Influenza B, POC: NEGATIVE

## 2017-08-08 LAB — POCT RAPID STREP A (OFFICE): RAPID STREP A SCREEN: NEGATIVE

## 2017-08-08 MED ORDER — FIRST-DUKES MOUTHWASH MT SUSP: 5.0000 mL | Freq: Three times a day (TID) | OROMUCOSAL | 0 refills | Status: DC | PRN

## 2017-08-08 MED ORDER — FLUTICASONE PROPIONATE 50 MCG/ACT NA SUSP: 2.0000 | Freq: Every day | NASAL | 3 refills | Status: DC

## 2017-08-08 NOTE — Progress Notes (Signed)
Name: Debra GathersMachelle A Stephens   MRN: [id number]    DOB: [birth year]   Date:08/08/2017       Progress Note  Subjective  Chief Complaint  Chief Complaint  Patient presents with  . Sore Throat  . URI    body achys, congested, left ear pain    HPI  Pt presents with 1 day of very sore throat, left otalgia, chills, nasal congestion, hoarse voice.  Denies NVD, abdominal pain, chest pain, shortness of breath.  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  She has taken OTC cough syrup with mucinex and "something for colds and pain" without relief.  Patient Active Problem List   Diagnosis Date Noted  . Obesity (BMI 30-39.9) 07/27/2017  . 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 Acute non-recurrent maxillary sinusitis 05/17/2016  . Acute non-recurrent maxillary sinusitis 03/02/2016  . Acute infectious nonbacterial gastroenteritis 03/02/2016  . Atypical chest pain 06/24/2013    Social History   Tobacco Use  . Smoking status: Never Smoker  . Smokeless tobacco: Never Used  Substance Use Topics  . Alcohol use: No    Current Outpatient Medications:  .  acetaminophen (TYLENOL) 325 MG tablet, Take 650 mg by mouth as needed., Disp: , Rfl:  .  Multiple Vitamin (MULTIVITAMIN WITH MINERALS) TABS tablet, Take 1 tablet by mouth daily., Disp: , Rfl:  .  phentermine (ADIPEX-P) 37.5 MG tablet, Take 1 tablet (37.5 mg total) by mouth daily before breakfast., Disp: 30 tablet, Rfl: 0 .  Vitamin D, Ergocalciferol, (DRISDOL) 50000 units CAPS capsule, Take 1 capsule (50,000 Units total) by mouth every 7 (seven) days., Disp: 12 capsule, Rfl: 0 .  Diphenhyd-Hydrocort-Nystatin (FIRST-DUKES MOUTHWASH) SUSP, Use as directed 5 mLs in the mouth or throat 3 (three) times daily as needed (Sore Throat). Gargle and spit., Disp: 237 mL, Rfl: 0 .  fluticasone (FLONASE) 50 MCG/ACT nasal spray, Place 2 sprays into both nostrils daily., Disp: 16 g, Rfl: 3  Allergies  Allergen  Reactions  . Erythromycin Other (See Comments)  . Ibuprofen Hives  . Penicillins Other (See Comments)  . Vibramycin [Doxycycline Calcium] Hives    ROS  Constitutional: Negative for fever or weight change.  Respiratory: Negative for cough and shortness of breath.   HEENT: See HPI Cardiovascular: Negative for chest pain or palpitations.  Gastrointestinal: Negative for abdominal pain, no bowel changes.  Musculoskeletal: Negative for gait problem or joint swelling.  Skin: Negative for rash.  Neurological: Negative for dizziness or headache.  No other specific complaints in a complete review of systems (except as listed in HPI above).  Objective  Vitals:   08/08/17 1123  BP: 120/82  Pulse: 83  Resp: 16  Temp: 97.6 F (36.4 C)  TempSrc: Oral  SpO2: 97%  Weight: 191 lb 11.2 oz (87 kg)  Height: 5\' 6"  (1.676 m)   Body mass index is 30.94 kg/m.  Nursing Note and Vital Signs reviewed.  Physical Exam  Constitutional: Patient appears well-developed and well-nourished. Obese. No distress.  HEENT: head atraumatic, normocephalic, pupils equal and reactive to light, EOM's intact, TM's without erythema or bulging, no maxillary or frontal sinus tenderness, neck supple without lymphadenopathy, oropharynx injected and moist without exudate Cardiovascular: Normal rate, regular rhythm, S1/S2 present.  No murmur or rub heard. No BLE edema. Pulmonary/Chest: Effort normal and breath sounds clear. No respiratory distress or retractions. Psychiatric: Patient has a normal mood and affect. behavior is normal. Judgment and thought content  normal.  Results for orders placed or performed in visit on 08/08/17 (from the past 72 hour(s))  POCT rapid strep A     Status: None   Collection Time: 08/08/17 11:42 AM  Result Value Ref Range   Rapid Strep A Screen Negative Negative  POCT Influenza A/B     Status: None   Collection Time: 08/08/17 11:47 AM  Result Value Ref Range   Influenza A, POC Negative  Negative   Influenza B, POC Negative Negative    Assessment & Plan  1. Generalized body aches - POCT Influenza A/B  2. Sore throat - POCT rapid strep A - Diphenhyd-Hydrocort-Nystatin (FIRST-DUKES MOUTHWASH) SUSP; Use as directed 5 mLs in the mouth or throat 3 (three) times daily as needed (Sore Throat). Gargle and spit.  Dispense: 237 mL; Refill: 0  3. Nasal congestion - fluticasone (FLONASE) 50 MCG/ACT nasal spray; Place 2 sprays into both nostrils daily.  Dispense: 16 g; Refill: 3  -Red flags and when to present for emergency care or RTC including fever >101.1F, chest pain, shortness of breath, new/worsening/un-resolving symptoms, reviewed with patient at time of visit. Follow up and care instructions discussed and provided in AVS.  - Work note for today and tomorrow is provided.

## 2017-08-08 NOTE — Patient Instructions (Addendum)
You may continue to take Mucinex-DM twice daily for congestion. Please use flonase once daily for nasal congestion and to help your ear pain. Use a cool mist humidifier at night. Take Tylenol and/or Advil as needed for body aches, fever, and/or throat pain.  Cool Mist Vaporizer A cool mist vaporizer is a device that releases a cool mist into the air. If you have a cough or a cold, using a vaporizer may help relieve your symptoms. The mist adds moisture to the air, which may help thin your mucus and make it less sticky. When your mucus is thin and less sticky, it easier for you to breathe and to cough up secretions. Do not use a vaporizer if you are allergic to mold. Follow these instructions at home:  Follow the instructions that come with the vaporizer.  Do not use anything other than distilled water in the vaporizer.  Do not run the vaporizer all of the time. Doing that can cause mold or bacteria to grow in the vaporizer.  Clean the vaporizer after each time that you use it.  Clean and dry the vaporizer well before storing it.  Stop using the vaporizer if your breathing symptoms get worse. This information is not intended to replace advice given to you by your health care provider. Make sure you discuss any questions you have with your health care provider. Document Released: 02/24/2004 Document Revised: 12/17/2015 Document Reviewed: 08/28/2015 Elsevier Interactive Patient Education  2018 Elsevier Inc.   Upper Respiratory Infection, Adult Most upper respiratory infections (URIs) are caused by a virus. A URI affects the nose, throat, and upper air passages. The most common type of URI is often called "the common cold." Follow these instructions at home:  Take medicines only as told by your doctor.  Gargle warm saltwater or take cough drops to comfort your throat as told by your doctor.  Use a warm mist humidifier or inhale steam from a shower to increase air moisture. This may  make it easier to breathe.  Drink enough fluid to keep your pee (urine) clear or pale yellow.  Eat soups and other clear broths.  Have a healthy diet.  Rest as needed.  Go back to work when your fever is gone or your doctor says it is okay. ? You may need to stay home longer to avoid giving your URI to others. ? You can also wear a face mask and wash your hands often to prevent spread of the virus.  Use your inhaler more if you have asthma.  Do not use any tobacco products, including cigarettes, chewing tobacco, or electronic cigarettes. If you need help quitting, ask your doctor. Contact a doctor if:  You are getting worse, not better.  Your symptoms are not helped by medicine.  You have chills.  You are getting more short of breath.  You have brown or red mucus.  You have yellow or brown discharge from your nose.  You have pain in your face, especially when you bend forward.  You have a fever.  You have puffy (swollen) neck glands.  You have pain while swallowing.  You have white areas in the back of your throat. Get help right away if:  You have very bad or constant: ? Headache. ? Ear pain. ? Pain in your forehead, behind your eyes, and over your cheekbones (sinus pain). ? Chest pain.  You have long-lasting (chronic) lung disease and any of the following: ? Wheezing. ? Long-lasting cough. ? Coughing up  blood. ? A change in your usual mucus.  You have a stiff neck.  You have changes in your: ? Vision. ? Hearing. ? Thinking. ? Mood. This information is not intended to replace advice given to you by your health care provider. Make sure you discuss any questions you have with your health care provider. Document Released: 11/15/2007 Document Revised: 01/30/2016 Document Reviewed: 09/03/2013 Elsevier Interactive Patient Education  2018 [employer]

## 2017-08-13 ENCOUNTER — Encounter (INDEPENDENT_AMBULATORY_CARE_PROVIDER_SITE_OTHER): Payer: Self-pay

## 2017-08-13 ENCOUNTER — Other Ambulatory Visit: Payer: Self-pay

## 2017-08-13 ENCOUNTER — Other Ambulatory Visit
Admission: RE | Admit: 2017-08-13 | Discharge: 2017-08-13 | Disposition: A | Discharge status: Home / Self Care | Payer: BLUE CROSS/BLUE SHIELD | Source: Ambulatory Visit | Attending: Gastroenterology | Admitting: Gastroenterology

## 2017-08-13 ENCOUNTER — Encounter: Payer: Self-pay | Admitting: Gastroenterology

## 2017-08-13 ENCOUNTER — Ambulatory Visit: Payer: BLUE CROSS/BLUE SHIELD | Admitting: Gastroenterology

## 2017-08-13 VITALS — BP 132/75 | HR 91 | Wt 193.4 lb

## 2017-08-13 DIAGNOSIS — R718 Other abnormality of red blood cells: Secondary | ICD-10-CM

## 2017-08-13 DIAGNOSIS — K5904 Chronic idiopathic constipation: Secondary | ICD-10-CM

## 2017-08-13 LAB — IRON AND TIBC
Iron: 32 ug/dL (ref 28–170)
SATURATION RATIOS: 13 % (ref 10.4–31.8)
TIBC: 242 ug/dL — AB (ref 250–450)
UIBC: 210 ug/dL

## 2017-08-13 LAB — VITAMIN B12: Vitamin B-12: 334 pg/mL (ref 180–914)

## 2017-08-13 LAB — FERRITIN: FERRITIN: 141 ng/mL (ref 11–307)

## 2017-08-13 NOTE — Patient Instructions (Addendum)
High-Fiber Diet  Fiber, also called dietary fiber, is a type of carbohydrate found in fruits, vegetables, whole grains, and beans. A high-fiber diet can have many health benefits. Your health care provider may recommend a high-fiber diet to help:  · Prevent constipation. Fiber can make your bowel movements more regular.  · Lower your cholesterol.  · Relieve hemorrhoids, uncomplicated diverticulosis, or irritable bowel syndrome.  · Prevent overeating as part of a weight-loss plan.  · Prevent heart disease, type 2 diabetes, and certain cancers.    What is my plan?  The recommended daily intake of fiber includes:  · 38 grams for men under age 50.  · 30 grams for men over age 50.  · 25 grams for women under age 50.  · 21 grams for women over age 50.    You can get the recommended daily intake of dietary fiber by eating a variety of fruits, vegetables, grains, and beans. Your health care provider may also recommend a fiber supplement if it is not possible to get enough fiber through your diet.  What do I need to know about a high-fiber diet?  · Fiber supplements have not been widely studied for their effectiveness, so it is better to get fiber through food sources.  · Always check the fiber content on the nutrition facts label of any prepackaged food. Look for foods that contain at least 5 grams of fiber per serving.  · Ask your dietitian if you have questions about specific foods that are related to your condition, especially if those foods are not listed in the following section.  · Increase your daily fiber consumption gradually. Increasing your intake of dietary fiber too quickly may cause bloating, cramping, or gas.  · Drink plenty of water. Water helps you to digest fiber.  What foods can I eat?  Grains  Whole-grain breads. Multigrain cereal. Oats and oatmeal. Brown rice. Barley. Bulgur wheat. Millet. Bran muffins. Popcorn. Rye wafer crackers.  Vegetables   Sweet potatoes. Spinach. Kale. Artichokes. Cabbage. Broccoli. Green peas. Carrots. Squash.  Fruits  Berries. Pears. Apples. Oranges. Avocados. Prunes and raisins. Dried figs.  Meats and Other Protein Sources  Navy, kidney, pinto, and soy beans. Split peas. Lentils. Nuts and seeds.  Dairy  Fiber-fortified yogurt.  Beverages  Fiber-fortified soy milk. Fiber-fortified orange juice.  Other  Fiber bars.  The items listed above may not be a complete list of recommended foods or beverages. Contact your dietitian for more options.  What foods are not recommended?  Grains  White bread. Pasta made with refined flour. White rice.  Vegetables  Fried potatoes. Canned vegetables. Well-cooked vegetables.  Fruits  Fruit juice. Cooked, strained fruit.  Meats and Other Protein Sources  Fatty cuts of meat. Fried poultry or fried fish.  Dairy  Milk. Yogurt. Cream cheese. Sour cream.  Beverages  Soft drinks.  Other  Cakes and pastries. Butter and oils.  The items listed above may not be a complete list of foods and beverages to avoid. Contact your dietitian for more information.  What are some tips for including high-fiber foods in my diet?  · Eat a wide variety of high-fiber foods.  · Make sure that half of all grains consumed each day are whole grains.  · Replace breads and cereals made from refined flour or white flour with whole-grain breads and cereals.  · Replace white rice with brown rice, bulgur wheat, or millet.  · Start the day with a breakfast that is high in fiber,   such as a cereal that contains at least 5 grams of fiber per serving.  · Use beans in place of meat in soups, salads, or pasta.  · Eat high-fiber snacks, such as berries, raw vegetables, nuts, or popcorn.  This information is not intended to replace advice given to you by your health care provider. Make sure you discuss any questions you have with your health care provider.  Document Released: 05/29/2005 Document Revised: 11/04/2015 Document Reviewed: 11/11/2013   Elsevier Interactive Patient Education © 2018 Elsevier Inc.

## 2017-08-13 NOTE — Progress Notes (Signed)
Debra Repressohini R Vanga, MD [address]  [address]  [place], [place] [address]  Main: [phone]  Fax: [phone]    Gastroenterology Consultation  Referring Provider:     Nadara MustardHarris, Debra P, MD Primary Care Physician:  Debra PasseyLada, Debra P, MD Primary Gastroenterologist:  Dr. Arlyss Repressohini R Stephens Reason for Consultation:     Chronic constipation        HPI:   Debra Stephens is a 47 y.o. female referred by Dr. Kerman PasseyLada, Debra P, MD  for consultation & management of chronic constipation for approx 15years. Hard stools, frequency 1-2 times weekly, a/w straining, occasional blood on wiping, bloating. Denies abdominal pain, but have cramps when severe. She has tried MiraLAX, Metamucil, Senokot, Colace as needed which do help with constipation. She drinks sweetened tea daily, consumes red meat on a regular basis. She has a strong family history of autoimmune diseases and her serologies are positive. She is closely followed by rheumatology who does not think she has organ damage yet. She does have chronic microcytosis. Her hemoglobin has been normal, patient reports that she was anemic in the past. Unknown ferritin levels. She is not taking any iron supplementation. Her B12 levels were normal in 2018. She denies any other GI symptoms. She is trying to lose weight, on phentermine. TSH is normal   She currently has ear infection and pharyngitis for which she is on antibiotics.  NSAIDs: None   Antiplts/Anticoagulants/Anti thrombotics: None   GI Procedures: None She does not smoke or drink alcohol. Strong family of lupus, RA She denies family history of colon cancer or other GI malignancies   Past Medical History:  Diagnosis Date  . Abdominal tenderness, right upper quadrant   . Abnormal Pap smear of cervix   . Allergic rhinitis, cause unspecified   . Anemia   . Chronic headaches   . Cough   . Lumbago   . Other and unspecified hyperlipidemia   . Swelling   . Thoracic or lumbosacral neuritis or  radiculitis, unspecified   . Unspecified symptom associated with female genital organs   . Unspecified vitamin D deficiency     Past Surgical History:  Procedure Laterality Date  . BREAST LUMPECTOMY Left   . TUBAL LIGATION    . VAGINAL HYSTERECTOMY       Current Outpatient Medications:  .  acetaminophen (TYLENOL) 325 MG tablet, Take 650 mg by mouth as needed., Disp: , Rfl:  .  amoxicillin-clavulanate (AUGMENTIN) 875-125 MG tablet, Take by mouth., Disp: , Rfl:  .  Benzocaine (HURRICAINE) 20 % AERO, Use as directed 1 spray in the mouth or throat 4 (four) times daily as needed (sore throat), Disp: , Rfl:  .  benzonatate (TESSALON) 200 MG capsule, Take by mouth., Disp: , Rfl:  .  Diphenhyd-Hydrocort-Nystatin (FIRST-DUKES MOUTHWASH) SUSP, Use as directed 5 mLs in the mouth or throat 3 (three) times daily as needed (Sore Throat). Gargle and spit., Disp: 237 mL, Rfl: 0 .  fluticasone (FLONASE) 50 MCG/ACT nasal spray, Place 2 sprays into both nostrils daily., Disp: 16 g, Rfl: 3 .  guaiFENesin-codeine 100-10 MG/5ML syrup, Take by mouth., Disp: , Rfl:  .  Multiple Vitamin (MULTIVITAMIN WITH MINERALS) TABS tablet, Take 1 tablet by mouth daily., Disp: , Rfl:  .  phentermine (ADIPEX-Stephens) 37.5 MG tablet, Take 1 tablet (37.5 mg total) by mouth daily before breakfast., Disp: 30 tablet, Rfl: 0 .  Vitamin D, Ergocalciferol, (DRISDOL) 50000 units CAPS capsule, Take 1 capsule (50,000 Units total) by  mouth every 7 (seven) days., Disp: 12 capsule, Rfl: 0   Family History  Problem Relation Age of Onset  . Stroke Mother   . Hypertension Mother   . Heart murmur Mother   . Hypertension Father   . Breast cancer Maternal Aunt   . Lupus Sister   . Lupus Daughter   . Liver cancer Maternal Grandfather   . Cirrhosis Maternal Grandfather   . Hypertension Paternal Grandmother   . Sickle cell anemia Sister   . Thyroid cancer Sister   . Seizures Sister   . Hashimoto's thyroiditis Sister   . Fibromyalgia Sister        Social History   Tobacco Use  . Smoking status: Never Smoker  . Smokeless tobacco: Never Used  Substance Use Topics  . Alcohol use: No  . Drug use: No    Allergies as of 08/13/2017 - Review Complete 08/08/2017  Allergen Reaction Noted  . Erythromycin Other (See Comments) 06/18/2013  . Ibuprofen Hives 08/12/2015  . Penicillins Other (See Comments) 10/17/2010  . Vibramycin [doxycycline calcium] Hives 06/18/2013    Review of Systems:    All systems reviewed and negative except where noted in HPI.   Physical Exam:  BP 132/75   Pulse 91   Wt 193 lb 6.4 oz (87.7 kg)   BMI 31.22 kg/m  No LMP recorded. Patient has had a hysterectomy.  General:   Alert,  Well-developed, well-nourished, pleasant and cooperative in NAD Head:  Normocephalic and atraumatic. Eyes:  Sclera clear, no icterus.   Conjunctiva pink. Ears:  Normal auditory acuity. Nose:  No deformity, discharge, or lesions. Mouth:  No deformity or lesions,oropharynx pink & moist. Neck:  Supple; no masses or thyromegaly. Lungs:  Respirations even and unlabored.  Clear throughout to auscultation.   No wheezes, crackles, or rhonchi. No acute distress. Heart:  Regular rate and rhythm; no murmurs, clicks, rubs, or gallops. Abdomen:  Normal bowel sounds. Soft, non-tender and distended, tympanitic without masses, hepatosplenomegaly or hernias noted.  No guarding or rebound tenderness.   Rectal: Not performed Msk:  Symmetrical without gross deformities. Good, equal movement & strength bilaterally. Pulses:  Normal pulses noted. Extremities:  No clubbing or edema.  No cyanosis. Neurologic:  Alert and oriented x3;  grossly normal neurologically. Skin:  Intact without significant lesions or rashes. No jaundice. Psych:  Alert and cooperative. Normal mood and affect.  Imaging Studies: No abdominal imaging   Assessment and Plan:   [name] is a 47 y.o. African-American  female  seen in consultation for chronic  constipation   Chronic constipation: Most likely idiopathic - Discussed with her about high-fiber diet and fiber supplements, education material provided - Avoid red meat and sweetened tea - Recommended her to take MiraLAX one to 2 cups daily - We'll try prescription medications for constipation if above regimen fails  Colon cancer screening: Overdue - Recommend colonoscopy in 2 weeks   Microcytosis: - Check ferritin, iron, TIBC - Check vitamin B12  Follow up in 2 months   Debra Repress, MD

## 2017-08-15 ENCOUNTER — Other Ambulatory Visit: Payer: Self-pay

## 2017-08-15 DIAGNOSIS — Z1211 Encounter for screening for malignant neoplasm of colon: Secondary | ICD-10-CM

## 2017-08-27 ENCOUNTER — Ambulatory Visit: Payer: BLUE CROSS/BLUE SHIELD | Admitting: Obstetrics & Gynecology

## 2017-08-27 ENCOUNTER — Encounter: Payer: Self-pay | Admitting: Obstetrics & Gynecology

## 2017-08-27 VITALS — BP 130/80 | Ht 66.0 in | Wt 187.0 lb

## 2017-08-27 DIAGNOSIS — E669 Obesity, unspecified: Secondary | ICD-10-CM | POA: Diagnosis not present

## 2017-08-27 MED ORDER — PHENTERMINE HCL 37.5 MG PO TABS: 37.5000 mg | ORAL_TABLET | Freq: Every day | ORAL | 1 refills | Status: DC

## 2017-08-27 NOTE — Progress Notes (Signed)
History of Present Illness:  Debra Stephens is a 47 y.o. who was started on  Current Outpatient Medications on File Prior to Visit  Medication Sig Dispense Refill  . acetaminophen (TYLENOL) 325 MG tablet Take 650 mg by mouth as needed.    . Benzocaine (HURRICAINE) 20 % AERO Use as directed 1 spray in the mouth or throat 4 (four) times daily as needed (sore throat)    . Diphenhyd-Hydrocort-Nystatin (FIRST-DUKES MOUTHWASH) SUSP Use as directed 5 mLs in the mouth or throat 3 (three) times daily as needed (Sore Throat). Gargle and spit. 237 mL 0  . fluticasone (FLONASE) 50 MCG/ACT nasal spray Place 2 sprays into both nostrils daily. 16 g 3  . guaiFENesin-codeine 100-10 MG/5ML syrup Take by mouth.    . Multiple Vitamin (MULTIVITAMIN WITH MINERALS) TABS tablet Take 1 tablet by mouth daily.    . Vitamin D, Ergocalciferol, (DRISDOL) 50000 units CAPS capsule Take 1 capsule (50,000 Units total) by mouth every 7 (seven) days. 12 capsule 0   No current facility-administered medications on file prior to visit.    approximately 4 weeks ago due to obesity/abnormal weight gain. The patient has lost 7 pounds over the past month due to lifestyle changes, meds (took for only one week as has been sick other weeks)..   She has these side effects: dry mouth.  PMHx: She  has a past medical history of Abdominal tenderness, right upper quadrant, Abnormal Pap smear of cervix, Allergic rhinitis, cause unspecified, Anemia, Chronic headaches, Cough, Lumbago, Other and unspecified hyperlipidemia, Swelling, Thoracic or lumbosacral neuritis or radiculitis, unspecified, Unspecified symptom associated with female genital organs, and Unspecified vitamin D deficiency. Also,  has a past surgical history that includes Vaginal hysterectomy; Tubal ligation; and Breast lumpectomy (Left)., family history includes Breast cancer in her maternal aunt; Cirrhosis in her maternal grandfather; Fibromyalgia in her sister; Hashimoto's thyroiditis  in her sister; Heart murmur in her mother; Hypertension in her father, mother, and paternal grandmother; Liver cancer in her maternal grandfather; Lupus in her daughter and sister; Seizures in her sister; Sickle cell anemia in her sister; Stroke in her mother; Thyroid cancer in her sister.,  reports that  has never smoked. she has never used smokeless tobacco. She reports that she does not drink alcohol or use drugs.  She has a current medication list which includes the following prescription(s): acetaminophen, benzocaine, first-dukes mouthwash, fluticasone, guaifenesin-codeine, multivitamin with minerals, phentermine, and vitamin d (ergocalciferol). Also, is allergic to erythromycin; ibuprofen; penicillins; and vibramycin [doxycycline calcium].  Review of Systems  All other systems reviewed and are negative.   Physical Exam:  BP 130/80   Ht 5\' 6"  (1.676 m)   Wt 187 lb (84.8 kg)   BMI 30.18 kg/m  Body mass index is 30.18 kg/m. Filed Weights   08/27/17 1108  Weight: 187 lb (84.8 kg)   Physical Exam  Constitutional: She is oriented to person, place, and time. She appears well-developed and well-nourished. No distress.  Musculoskeletal: Normal range of motion.  Neurological: She is alert and oriented to person, place, and time.  Skin: Skin is warm and dry.  Psychiatric: She has a normal mood and affect.  Vitals reviewed.   Assessment: obesity Medication treatment is going well for her.  Plan: Patient is continued/added to prescription appetite suppressants: Phentermine and self-directed dieting.   Will continue to assist patient in incorporating positive experiences into her life to promote a positive mental attitude.  Education given regarding appropriate lifestyle changes for weight loss,  including regular physical activity, healthy coping strategies, caloric restriction, and healthy eating patterns.  The risks and benefits as well as side effects of medication, such as  Phenteramine or Tenuate, is discussed.  The pros and cons of suppressing appetite and boosting metabolism is counseled.  Risks of tolerance and addiction discussed.  Use of medicine will be short term.  Pt to call with any negative side effects and agrees to keep follow up appointments.  A total of 15 minutes were spent face-to-face with the patient during this encounter and over half of that time dealt with counseling and coordination of care.  Annamarie MajorPaul Demorris Choyce, MD, Merlinda FrederickFACOG [facility], [facility] 08/27/2017  11:23 AM

## 2017-08-28 ENCOUNTER — Telehealth: Payer: Self-pay | Admitting: Gastroenterology

## 2017-08-28 NOTE — Telephone Encounter (Signed)
Returned patients call to reschedule her Colonoscopy due to her being sick.  She has been rescheduled to April 9th.  Debra Stephens has been informed of the procedure change.  Thanks [employer]

## 2017-08-28 NOTE — Telephone Encounter (Signed)
Pt wants to r/s her procedure for 08/30/17 Thursday please call

## 2017-09-14 ENCOUNTER — Ambulatory Visit: Payer: BLUE CROSS/BLUE SHIELD | Admitting: Gastroenterology

## 2017-09-17 ENCOUNTER — Encounter: Payer: Self-pay | Admitting: *Deleted

## 2017-09-17 ENCOUNTER — Telehealth: Payer: Self-pay | Admitting: Gastroenterology

## 2017-09-17 NOTE — Telephone Encounter (Signed)
Pt left vm to r/s colonoscopy for tomoroow

## 2017-09-17 NOTE — Telephone Encounter (Signed)
Patient has requested to reschedule colonoscopy due to classes on Tuesdays.  She [facility]  Trish in Endo has been informed.  Thanks [employer]

## 2017-09-28 ENCOUNTER — Emergency Department: Payer: BLUE CROSS/BLUE SHIELD

## 2017-09-28 ENCOUNTER — Other Ambulatory Visit: Payer: Self-pay

## 2017-09-28 ENCOUNTER — Encounter: Payer: Self-pay | Admitting: Emergency Medicine

## 2017-09-28 DIAGNOSIS — R0789 Other chest pain: Secondary | ICD-10-CM | POA: Insufficient documentation

## 2017-09-28 DIAGNOSIS — R200 Anesthesia of skin: Secondary | ICD-10-CM | POA: Insufficient documentation

## 2017-09-28 DIAGNOSIS — Z733 Stress, not elsewhere classified: Secondary | ICD-10-CM | POA: Diagnosis not present

## 2017-09-28 DIAGNOSIS — M79602 Pain in left arm: Secondary | ICD-10-CM | POA: Diagnosis present

## 2017-09-28 DIAGNOSIS — Z79899 Other long term (current) drug therapy: Secondary | ICD-10-CM | POA: Insufficient documentation

## 2017-09-28 LAB — CBC
HCT: 39.2 % (ref 35.0–47.0)
Hemoglobin: 12.8 g/dL (ref 12.0–16.0)
MCH: 24 pg — ABNORMAL LOW (ref 26.0–34.0)
MCHC: 32.7 g/dL (ref 32.0–36.0)
MCV: 73.6 fL — AB (ref 80.0–100.0)
PLATELETS: 199 10*3/uL (ref 150–440)
RBC: 5.33 MIL/uL — AB (ref 3.80–5.20)
RDW: 16 % — ABNORMAL HIGH (ref 11.5–14.5)
WBC: 3.7 10*3/uL (ref 3.6–11.0)

## 2017-09-28 NOTE — ED Triage Notes (Signed)
Pt arrives POV to triage with c/o left arm pain and tingling in her finger tips. Pt reports no cardiac hx but states that she has family hx of stroke. Pt is in NAD.

## 2017-09-29 ENCOUNTER — Emergency Department
Admission: EM | Admit: 2017-09-29 | Discharge: 2017-09-29 | Disposition: A | Discharge status: Home / Self Care | Payer: BLUE CROSS/BLUE SHIELD | Attending: Emergency Medicine | Admitting: Emergency Medicine

## 2017-09-29 ENCOUNTER — Emergency Department: Payer: BLUE CROSS/BLUE SHIELD

## 2017-09-29 DIAGNOSIS — M79602 Pain in left arm: Secondary | ICD-10-CM

## 2017-09-29 DIAGNOSIS — R0789 Other chest pain: Secondary | ICD-10-CM

## 2017-09-29 DIAGNOSIS — R200 Anesthesia of skin: Secondary | ICD-10-CM

## 2017-09-29 LAB — BASIC METABOLIC PANEL
Anion gap: 4 — ABNORMAL LOW (ref 5–15)
BUN: 14 mg/dL (ref 6–20)
CHLORIDE: 106 mmol/L (ref 101–111)
CO2: 28 mmol/L (ref 22–32)
CREATININE: 0.96 mg/dL (ref 0.44–1.00)
Calcium: 8.5 mg/dL — ABNORMAL LOW (ref 8.9–10.3)
Glucose, Bld: 104 mg/dL — ABNORMAL HIGH (ref 65–99)
Potassium: 3.8 mmol/L (ref 3.5–5.1)
SODIUM: 138 mmol/L (ref 135–145)

## 2017-09-29 LAB — TROPONIN I: Troponin I: <0.03 ng/mL (ref <0.03)

## 2017-09-29 MED ORDER — IOPAMIDOL (ISOVUE-370) INJECTION 76%
125.0000 mL | Freq: Once | INTRAVENOUS | Status: AC | PRN
  Administered 2017-09-29: 125 mL via INTRAVENOUS

## 2017-09-29 NOTE — ED Notes (Signed)
Patient c/o left arm tingling and numbness, shooting pains down left arm, left chest and back pain described as pressure beginning at 2300 yesterday. Patient denies current chest/back pain. Patient reports continued discomfort in left arm. Patient denies SOB, diaphoresis, N/V. Patient reports accompanying symptoms of lightheadedness. Patient reports that she was lying in bed when symptoms began.   Patient reports similar episode 1 week ago. Patient was not evaluated at the time. Patient took her daughters acid reflux medication and drank some ginger ale and her symptoms resolved.

## 2017-09-29 NOTE — Discharge Instructions (Signed)
Your workup in the Emergency Department today was reassuring.  We did not find any specific abnormalities.  We recommend you drink plenty of fluids, take your regular medications and/or any new ones prescribed today, and follow up with the doctor(s) listed in these documents as recommended.  Return to the Emergency Department if you develop new or worsening symptoms that concern you.  

## 2017-09-29 NOTE — ED Notes (Signed)
Patient became dizzy with ambulation. MD informed.

## 2017-09-29 NOTE — ED Notes (Signed)
Reviewed discharge instructions and follow-up care with patient. Patient verbalized understanding of all information reviewed. Patient stable, with no distress noted at this time.    

## 2017-09-29 NOTE — ED Notes (Signed)
ED Provider at bedside. 

## 2017-09-29 NOTE — ED Provider Notes (Signed)
[facility] Emergency Department Provider Note  ____________________________________________   First MD Initiated Contact with Patient 09/29/17 0234     (approximate)  I have reviewed the triage vital signs and the nursing notes.   HISTORY  Chief Complaint Arm Pain    HPI Debra Stephens is a 47 y.o. female who presents for a variety of complaints but most specifically she complains of left arm pain with some numbness and tingling that occurred acutely several hours prior to coming to the emergency department at the same time she developed some central chest pain that radiates through to her back.  The symptoms started acutely and more severe although they have lessened are now mild.  She reports that she no longer has any chest or back pain but she continues to have some pain in her left arm.  She denies weakness but says that it is somewhat numb and tingling.  She is been suffering from intermittent headaches for about a month but says that she has been under a great deal of stress and pressure lately with multiple social, family, and work stressors.  She thinks that the stress is contributing but because of the new onset arm pain and other issues tonight she came for further evaluation.  She denies fever/chills, difficulty breathing, abdominal pain, nausea, vomiting, and dysuria.  She felt a little bit dizzy at the same time she had the acute onset of pain but that too has resolved.  Nothing in particular made her symptoms better and they started to improve on their own.  She describes the pain in her arm as well as her chest as sharp.  Past Medical History:  Diagnosis Date  . Abdominal tenderness, right upper quadrant   . Abnormal Pap smear of cervix   . Allergic rhinitis, cause unspecified   . Anemia   . Chronic headaches   . Cough   . Lumbago   . Other and unspecified hyperlipidemia   . Swelling   . Thoracic or lumbosacral neuritis or radiculitis,  unspecified   . Unspecified symptom associated with female genital organs   . Unspecified vitamin D deficiency     Patient Active Problem List   Diagnosis Date Noted  . Chronic idiopathic constipation 08/13/2017  . Obesity (BMI 30-39.9) 07/27/2017  . 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 Acute non-recurrent maxillary sinusitis 05/17/2016  . Atypical chest pain 06/24/2013    Past Surgical History:  Procedure Laterality Date  . BREAST LUMPECTOMY Left   . TUBAL LIGATION    . VAGINAL HYSTERECTOMY      Prior to Admission medications   Medication Sig Start Date End Date Taking? Authorizing Provider  acetaminophen (TYLENOL) 325 MG tablet Take 650 mg by mouth as needed.    [provider]  Benzocaine (HURRICAINE) 20 % AERO Use as directed 1 spray in the mouth or throat 4 (four) times daily as needed (sore throat) 08/12/17   [provider]  Diphenhyd-Hydrocort-Nystatin (FIRST-DUKES MOUTHWASH) SUSP Use as directed 5 mLs in the mouth or throat 3 (three) times daily as needed (Sore Throat). Gargle and spit. 08/08/17   Doren Custard, FNP  fluticasone (FLONASE) 50 MCG/ACT nasal spray Place 2 sprays into both nostrils daily. 08/08/17   Doren Custard, FNP  guaiFENesin-codeine 100-10 MG/5ML syrup Take by mouth. 08/12/17   [provider]  Multiple Vitamin (MULTIVITAMIN WITH MINERALS) TABS tablet Take 1 tablet by mouth daily.    [provider]  phentermine (ADIPEX-P)  37.5 MG tablet Take 1 tablet (37.5 mg total) by mouth daily before breakfast. 08/27/17   Nadara MustardHarris, Robert P, MD  Vitamin D, Ergocalciferol, (DRISDOL) 50000 units CAPS capsule Take 1 capsule (50,000 Units total) by mouth every 7 (seven) days. 07/30/17   Alba CorySowles, Krichna, MD    Allergies Erythromycin; Ibuprofen; and Vibramycin [doxycycline calcium]  Family History  Problem Relation Age of Onset  . Stroke Mother   . Hypertension Mother   . Heart murmur Mother   . Hypertension Father   . Breast  cancer Maternal Aunt   . Lupus Sister   . Lupus Daughter   . Liver cancer Maternal Grandfather   . Cirrhosis Maternal Grandfather   . Hypertension Paternal Grandmother   . Sickle cell anemia Sister   . Thyroid cancer Sister   . Seizures Sister   . Hashimoto's thyroiditis Sister   . Fibromyalgia Sister     Social History Social History   Tobacco Use  . Smoking status: Never Smoker  . Smokeless tobacco: Never Used  Substance Use Topics  . Alcohol use: No  . Drug use: No    Review of Systems Constitutional: No fever/chills Eyes: No visual changes. ENT: No sore throat. Cardiovascular: +chest pain as described above that radiates through to her back Respiratory: Denies shortness of breath. Gastrointestinal: No abdominal pain.  No nausea, no vomiting.  No diarrhea.  No constipation. Genitourinary: Negative for dysuria. Musculoskeletal: Negative for neck pain.  Negative for back pain. Integumentary: Negative for rash. Neurological: Numbness and tingling in her left arm, no weakness.   ____________________________________________   PHYSICAL EXAM:  VITAL SIGNS: ED Triage Vitals  Enc Vitals Group     BP 09/28/17 2318 (!) 141/81     Pulse Rate 09/28/17 2318 82     Resp 09/28/17 2318 18     Temp 09/28/17 2318 99 F (37.2 C)     Temp Source 09/28/17 2318 Oral     SpO2 09/28/17 2318 100 %     Weight 09/28/17 2322 81.6 kg (180 lb)     Height 09/28/17 2322 1.676 m (5\' 6" )     Head Circumference --      Peak Flow --      Pain Score 09/28/17 2322 7     Pain Loc --      Pain Edu? --      Excl. in GC? --     Constitutional: Alert and oriented. Well appearing and in no acute distress. Eyes: Conjunctivae are normal Head: Atraumatic. Nose: No congestion/rhinnorhea. Mouth/Throat: Mucous membranes are moist. Neck: No stridor.  No meningeal signs.   Cardiovascular: Normal rate, regular rhythm. Good peripheral circulation. Grossly normal heart sounds.  No chest wall  tenderness to palpation Respiratory: Normal respiratory effort.  No retractions. Lungs CTAB. Gastrointestinal: Soft and nontender. No distention.  Musculoskeletal: No lower extremity tenderness nor edema. No gross deformities of extremities. Neurologic:  Normal speech and language. No gross focal neurologic deficits are appreciated.  She still reports some pain in her left arm but she has normal range of motion and normal grip strength in major muscle group strength throughout.  No facial droop. Skin:  Skin is warm, dry and intact. No rash noted. Psychiatric: Mood and affect are anxious and tearful when we discussed her symptoms.  No evidence of suicidal ideation but obviously under a lot of stress.  ____________________________________________   LABS (all labs ordered are listed, but only abnormal results are displayed)  Labs Reviewed  BASIC METABOLIC  PANEL - Abnormal; Notable for the following components:      Result Value   Glucose, Bld 104 (*)    Calcium 8.5 (*)    Anion gap 4 (*)    All other components within normal limits  CBC - Abnormal; Notable for the following components:   RBC 5.33 (*)    MCV 73.6 (*)    MCH 24.0 (*)    RDW 16.0 (*)    All other components within normal limits  TROPONIN I  TROPONIN I   ____________________________________________  EKG  ED ECG REPORT I, Loleta Rose, the attending physician, personally viewed and interpreted this ECG.  Date: 09/28/2017 EKG Time: 23: 16 Rate: 69 Rhythm: normal sinus rhythm QRS Axis: normal Intervals: normal ST/T Wave abnormalities: normal Narrative Interpretation: no evidence of acute ischemia  ____________________________________________  RADIOLOGY I, Loleta Rose, personally viewed and evaluated these images (plain radiographs) as part of my medical decision making, as well as reviewing the written report by the radiologist.  ED MD interpretation: No acute abnormalities on chest x-ray  Official  radiology report(s): Dg Chest 2 View  Result Date: 09/28/2017 CLINICAL DATA:  Acute onset of left arm pain and tingling at the fingertips. EXAM: CHEST - 2 VIEW COMPARISON:  Chest radiograph performed 07/10/2017 FINDINGS: The lungs are well-aerated and clear. There is no evidence of focal opacification, pleural effusion or pneumothorax. The heart is normal in size; the mediastinal contour is within normal limits. No acute osseous abnormalities are seen. IMPRESSION: No acute cardiopulmonary process seen. Electronically Signed   By: Roanna Raider M.D.   On: 09/28/2017 23:45   Ct Angio Chest/abd/pel For Dissection W And/or W/wo  Result Date: 09/29/2017 CLINICAL DATA:  Acute onset of left arm pain and numbness. Left-sided chest and back pain. EXAM: CT ANGIOGRAPHY CHEST, ABDOMEN AND PELVIS TECHNIQUE: Multidetector CT imaging through the chest, abdomen and pelvis was performed using the standard protocol during bolus administration of intravenous contrast. Multiplanar reconstructed images and MIPs were obtained and reviewed to evaluate the vascular anatomy. CONTRAST:  ISOVUE-370 IOPAMIDOL (ISOVUE-370) INJECTION 76% COMPARISON:  None. FINDINGS: CTA CHEST FINDINGS Cardiovascular: There is no evidence of aortic dissection. There is no evidence of aneurysmal dilatation. No calcific atherosclerotic disease is seen. There is no evidence of significant pulmonary embolus. The heart is normal in size. The great vessels are within normal limits. Mediastinum/Nodes: The mediastinum is unremarkable in appearance. No mediastinal lymphadenopathy is seen. No pericardial effusion is identified. The thyroid gland is unremarkable. No axillary lymphadenopathy is appreciated. Lungs/Pleura: Minimal left basilar atelectasis is noted. No pleural effusion or pneumothorax is seen. No masses are identified. Musculoskeletal: No acute osseous abnormalities are identified. There is osseous fusion of T3-T4. The visualized musculature is  unremarkable in appearance. Review of the MIP images confirms the above findings. CTA ABDOMEN AND PELVIS FINDINGS VASCULAR Aorta: There is no evidence of aortic dissection. There is no evidence of aneurysmal dilatation. No calcific atherosclerotic disease is seen. Celiac: The celiac trunk appears fully patent. SMA: The superior mesenteric artery is grossly unremarkable. Renals: The renal arteries appear patent bilaterally. IMA: The inferior mesenteric artery is unremarkable in appearance. Inflow: The common, internal and external iliac arteries are unremarkable in appearance. Veins: The visualized venous structures are unremarkable in appearance. Incomplete enhancement of the superior mesenteric vein is thought to reflect the phase of contrast enhancement. Review of the MIP images confirms the above findings. NON-VASCULAR Hepatobiliary: An 8 mm nonspecific hypodensity is noted at the right hepatic lobe.  The liver is otherwise unremarkable. The gallbladder is unremarkable in appearance. The common bile duct remains normal in caliber. Pancreas: The pancreas is within normal limits. Spleen: The spleen is unremarkable in appearance. Adrenals/Urinary Tract: The adrenal glands are unremarkable in appearance. The kidneys are within normal limits. There is no evidence of hydronephrosis. No renal or ureteral stones are identified. No perinephric stranding is seen. Stomach/Bowel: The stomach is unremarkable in appearance. The small bowel is within normal limits. The appendix is normal in caliber, without evidence of appendicitis. The colon is unremarkable in appearance. Lymphatic: No retroperitoneal or pelvic sidewall lymphadenopathy is seen. Reproductive: The bladder is mildly distended and grossly unremarkable. The patient is status post hysterectomy. No suspicious adnexal masses are seen. Other: No additional soft tissue abnormalities are seen. Musculoskeletal: No acute osseous abnormalities are identified. The visualized  musculature is unremarkable in appearance. Review of the MIP images confirms the above findings. IMPRESSION: 1. No evidence of aortic dissection. No evidence of aneurysmal dilatation. 2. No evidence of significant pulmonary embolus. 3. Minimal left basilar atelectasis.  Lungs otherwise clear. 4. 8 mm nonspecific hypodensity at the right hepatic lobe. Electronically Signed   By: Roanna Raider M.D.   On: 09/29/2017 04:20    ____________________________________________   PROCEDURES  Critical Care performed: No   Procedure(s) performed:   Procedures   ____________________________________________   INITIAL IMPRESSION / ASSESSMENT AND PLAN / ED COURSE  As part of my medical decision making, I reviewed the following data within the electronic MEDICAL RECORD NUMBER Nursing notes reviewed and incorporated, Labs reviewed  and EKG interpreted     Differential diagnosis includes, but is not limited to, cervical radiculopathy, musculoskeletal pain both in the [employer] in the chest, aortic dissection, pulmonary embolism, ACS.  CVA is unlikely given the presence of pain.  Anxiety and stress reaction is also possible for all the symptoms.  However the presence of acute onset chest pain rating through to the back with neurological symptoms necessitates a CT angiogram of the chest/abdomen/pelvis to rule out aortic dissection.  I discussed all this with the patient.  I did consider obtaining a noncontrast head CT as well but her symptoms of intermittent headaches have been present for about a month and she has no neurological symptoms to make me concerned about a tumor or mass.  It is very unlikely to be caused by a subarachnoid hemorrhage or other intracranial bleed given the duration of symptoms I think that her headache is likely more related to stress than anything else.  I explained to her I may not be able to identify exactly the cause of her symptoms tonight but it was important to rule out the potentially  life-threatening issue of aortic dissection as she states that she understands.  I am obtaining a second troponin as well.  Her lab work is reassuring with a normal metabolic panel, initial troponin, and CBC.  She has hypertensive compared to her baseline but this could be the result of pain and anxiety of being in the emergency department.  EKG is reassuring.  Clinical Course as of Sep 30 530  Sat Sep 29, 2017  0419 second troponin remains negative  Troponin I: <0.03 [CF]  0503 The patient states that she is feeling much better by now and has no more arm pain, numbness, nor tingling.  She also has no chest pain or back pain.  Is unclear exactly what was the etiology of her symptoms, but her CT angiogram was reassuring with no  evidence of an acute issue.  The patient is comfortable with plan for discharge and outpatient follow-up.  I gave strict return precautions should she have any additional symptoms and she agrees with the plan.   [CF]    Clinical Course User Index [CF] Loleta Rose, MD    ____________________________________________  FINAL CLINICAL IMPRESSION(S) / ED DIAGNOSES  Final diagnoses:  Left arm pain  Arm numbness left  Atypical chest pain     MEDICATIONS GIVEN DURING THIS VISIT:  Medications  iopamidol (ISOVUE-370) 76 % injection 125 mL (125 mLs Intravenous Contrast Given 09/29/17 0356)     ED Discharge Orders    None       Note:  This document was prepared using Dragon voice recognition software and may include unintentional dictation errors.    Loleta Rose, MD 09/29/17 [id number]

## 2017-10-02 ENCOUNTER — Telehealth: Payer: Self-pay

## 2017-10-02 NOTE — Telephone Encounter (Signed)
Copied from CRM [id number]. Topic: General - Other >> Oct 01, 2017  4:23 PM Percival SpanishKennedy, Cheryl W wrote:  Pt was seen in the ER at [facility] and was told to follow up with her PCP and Dr Sherie DonLada is booked. Pt has a 3 month follow up appt on 10/15/17.     >> Oct 02, 2017  8:31 AM Diana EvesHoyt, Maryann B wrote: Pt calling back checking on status of getting an appt with Dr. Sherie DonLada to follow up.  >> Oct 02, 2017  9:02 AM Myatt, Barbee CoughMelissa J wrote: Called pt back to get her scheduled for first available appt 10/09/17 with Lanora ManisElizabeth. Pt states if she has to wait that long then she may as well keep her appt on 10/15/17. I explained to pt that I would call her if we get a cancellation and also advised her to go back to the ER if her symptoms of numbness and tingling in her left arm and occasional chest pain continue or worsen. Pt understood instructions.

## 2017-10-15 ENCOUNTER — Ambulatory Visit: Payer: BLUE CROSS/BLUE SHIELD | Admitting: Family Medicine

## 2017-10-16 ENCOUNTER — Encounter: Admission: RE | Payer: Self-pay | Source: Ambulatory Visit

## 2017-10-16 ENCOUNTER — Telehealth: Payer: Self-pay | Admitting: Gastroenterology

## 2017-10-16 ENCOUNTER — Ambulatory Visit
Admission: RE | Admit: 2017-10-16 | Payer: BLUE CROSS/BLUE SHIELD | Source: Ambulatory Visit | Admitting: Gastroenterology

## 2017-10-16 SURGERY — COLONOSCOPY WITH PROPOFOL
Anesthesia: General

## 2017-10-16 NOTE — Telephone Encounter (Signed)
Patient waited until day before procedure to get her prep after 5pm and insurance didn't wouldn't pay for it so she was unable to prep. Please call her to r/s.

## 2017-10-19 ENCOUNTER — Other Ambulatory Visit: Payer: Self-pay

## 2017-10-19 ENCOUNTER — Ambulatory Visit: Payer: BLUE CROSS/BLUE SHIELD | Admitting: Gastroenterology

## 2017-10-19 DIAGNOSIS — Z1211 Encounter for screening for malignant neoplasm of colon: Secondary | ICD-10-CM

## 2017-10-19 MED ORDER — PEG 3350-KCL-NABCB-NACL-NASULF 236 G PO SOLR: 4000.0000 mL | Freq: Once | ORAL | 0 refills | Status: DC

## 2017-10-19 MED ORDER — PEG 3350-KCL-NABCB-NACL-NASULF 236 G PO SOLR: 4000.0000 mL | Freq: Once | ORAL | 0 refills | Status: AC

## 2017-10-26 ENCOUNTER — Ambulatory Visit: Payer: BLUE CROSS/BLUE SHIELD | Admitting: Obstetrics & Gynecology

## 2017-11-01 ENCOUNTER — Other Ambulatory Visit: Payer: Self-pay | Admitting: Gastroenterology

## 2017-11-01 ENCOUNTER — Ambulatory Visit
Admission: RE | Admit: 2017-11-01 | Discharge: 2017-11-01 | Disposition: A | Discharge status: Home / Self Care | Payer: BLUE CROSS/BLUE SHIELD | Source: Ambulatory Visit | Attending: Gastroenterology | Admitting: Gastroenterology

## 2017-11-01 ENCOUNTER — Ambulatory Visit: Payer: BLUE CROSS/BLUE SHIELD | Admitting: Anesthesiology

## 2017-11-01 ENCOUNTER — Encounter: Payer: Self-pay | Admitting: *Deleted

## 2017-11-01 ENCOUNTER — Encounter
Admission: RE | Disposition: A | Discharge status: Home / Self Care | Payer: Self-pay | Source: Ambulatory Visit | Attending: Gastroenterology

## 2017-11-01 ENCOUNTER — Other Ambulatory Visit: Payer: Self-pay

## 2017-11-01 DIAGNOSIS — Z1211 Encounter for screening for malignant neoplasm of colon: Secondary | ICD-10-CM | POA: Diagnosis not present

## 2017-11-01 DIAGNOSIS — Z5309 Procedure and treatment not carried out because of other contraindication: Secondary | ICD-10-CM | POA: Insufficient documentation

## 2017-11-01 SURGERY — COLONOSCOPY WITH PROPOFOL
Anesthesia: General

## 2017-11-01 MED ORDER — SODIUM CHLORIDE 0.9 % IV SOLN
INTRAVENOUS | Status: DC
  Administered 2017-11-01: 11:00:00 via INTRAVENOUS

## 2017-11-01 MED ORDER — SUPREP BOWEL PREP KIT 17.5-3.13-1.6 GM/177ML PO SOLN: 1.0000 | ORAL | 0 refills | Status: DC

## 2017-11-01 NOTE — H&P (Signed)
Colonoscopy cancelled due to poor prep and ate solid food yesterday. Rescheduled for tomorrow with Dr Maximino Greenland. She will do prep today, prescription sent  Arlyss Repress, MD [address]  [address]  [place], [place] [address]  Main: [phone]  Fax: [phone] Pager: [phone]

## 2017-11-01 NOTE — Anesthesia Preprocedure Evaluation (Signed)
Anesthesia Evaluation  Patient identified by MRN, date of birth, ID band Patient awake    Reviewed: Allergy & Precautions, H&P , NPO status , reviewed documented beta blocker date and time   Airway Mallampati: II  TM Distance: >3 FB Neck ROM: full    Dental  (+) Teeth Intact   Pulmonary    Pulmonary exam normal        Cardiovascular Normal cardiovascular exam     Neuro/Psych  Headaches,    GI/Hepatic GERD  Controlled,  Endo/Other    Renal/GU      Musculoskeletal   Abdominal   Peds  Hematology  (+) anemia ,   Anesthesia Other Findings Past Medical History: No date: Abdominal tenderness, right upper quadrant No date: Abnormal Pap smear of cervix No date: Allergic rhinitis, cause unspecified No date: Anemia No date: Chronic headaches No date: Cough No date: Lumbago No date: Other and unspecified hyperlipidemia No date: Swelling No date: Thoracic or lumbosacral neuritis or radiculitis, unspecified No date: Unspecified symptom associated with female genital organs No date: Unspecified vitamin D deficiency  Past Surgical History: No date: BREAST LUMPECTOMY; Left No date: TUBAL LIGATION No date: VAGINAL HYSTERECTOMY  BMI    Body Mass Index:  30.18 kg/m      Reproductive/Obstetrics                             Anesthesia Physical Anesthesia Plan  ASA: II  Anesthesia Plan: General   Post-op Pain Management:    Induction:   PONV Risk Score and Plan: 3 and Treatment may vary due to age or medical condition and TIVA  Airway Management Planned:   Additional Equipment:   Intra-op Plan:   Post-operative Plan:   Informed Consent: I have reviewed the patients History and Physical, chart, labs and discussed the procedure including the risks, benefits and alternatives for the proposed anesthesia with the patient or authorized representative who has indicated his/her understanding  and acceptance.   Dental Advisory Given  Plan Discussed with: CRNA  Anesthesia Plan Comments:         Anesthesia Quick Evaluation

## 2017-11-01 NOTE — H&P (Deleted)
Arlyss Repress, MD [address]  [address]  [place], [place] [address]  Main: [phone]  Fax: [phone] Pager: [phone]  Primary Care Physician:  Kerman Passey, MD Primary Gastroenterologist:  Dr. Arlyss Repress  Pre-Procedure History & Physical: HPI:  Debra Stephens is a 47 y.o. female is here for an colonoscopy.   Past Medical History:  Diagnosis Date  . Abdominal tenderness, right upper quadrant   . Abnormal Pap smear of cervix   . Allergic rhinitis, cause unspecified   . Anemia   . Chronic headaches   . Cough   . Lumbago   . Other and unspecified hyperlipidemia   . Swelling   . Thoracic or lumbosacral neuritis or radiculitis, unspecified   . Unspecified symptom associated with female genital organs   . Unspecified vitamin D deficiency     Past Surgical History:  Procedure Laterality Date  . BREAST LUMPECTOMY Left   . TUBAL LIGATION    . VAGINAL HYSTERECTOMY      Prior to Admission medications   Medication Sig Start Date End Date Taking? Authorizing Provider  acetaminophen (TYLENOL) 325 MG tablet Take 650 mg by mouth as needed.   Yes [provider]  fluticasone (FLONASE) 50 MCG/ACT nasal spray Place 2 sprays into both nostrils daily. 08/08/17  Yes Doren Custard, FNP  guaiFENesin-codeine 100-10 MG/5ML syrup Take by mouth. 08/12/17  Yes [provider]  Multiple Vitamin (MULTIVITAMIN WITH MINERALS) TABS tablet Take 1 tablet by mouth daily.   Yes [provider]  phentermine (ADIPEX-P) 37.5 MG tablet Take 1 tablet (37.5 mg total) by mouth daily before breakfast. 08/27/17  Yes Nadara Mustard, MD  Vitamin D, Ergocalciferol, (DRISDOL) 50000 units CAPS capsule Take 1 capsule (50,000 Units total) by mouth every 7 (seven) days. 07/30/17  Yes Sowles, Danna Hefty, MD  Benzocaine (HURRICAINE) 20 % AERO Use as directed 1 spray in the mouth or throat 4 (four) times daily as needed (sore throat) 08/12/17   [provider]    Diphenhyd-Hydrocort-Nystatin (FIRST-DUKES MOUTHWASH) SUSP Use as directed 5 mLs in the mouth or throat 3 (three) times daily as needed (Sore Throat). Gargle and spit. Patient not taking: Reported on 11/01/2017 08/08/17   Doren Custard, FNP    Allergies as of 10/19/2017 - Review Complete 09/29/2017  Allergen Reaction Noted  . Erythromycin Other (See Comments) 06/18/2013  . Ibuprofen Hives 08/12/2015  . Vibramycin [doxycycline calcium] Hives 06/18/2013    Family History  Problem Relation Age of Onset  . Stroke Mother   . Hypertension Mother   . Heart murmur Mother   . Hypertension Father   . Breast cancer Maternal Aunt   . Lupus Sister   . Lupus Daughter   . Liver cancer Maternal Grandfather   . Cirrhosis Maternal Grandfather   . Hypertension Paternal Grandmother   . Sickle cell anemia Sister   . Thyroid cancer Sister   . Seizures Sister   . Hashimoto's thyroiditis Sister   . Fibromyalgia Sister     Social History   Socioeconomic History  . Marital status: Single    Spouse name: Not on file  . Number of children: Not on file  . Years of education: Not on file  . Highest education level: Not on file  Occupational History  . Not on file  Social Needs  . Financial resource strain: Not on file  . Food insecurity:    Worry: Not on file    Inability: Not on  file  . Transportation needs:    Medical: Not on file    Non-medical: Not on file  Tobacco Use  . Smoking status: Never Smoker  . Smokeless tobacco: Never Used  Substance and Sexual Activity  . Alcohol use: No  . Drug use: No  . Sexual activity: Yes  Lifestyle  . Physical activity:    Days per week: Not on file    Minutes per session: Not on file  . Stress: Not on file  Relationships  . Social connections:    Talks on phone: Not on file    Gets together: Not on file    Attends religious service: Not on file    Active member of club or organization: Not on file    Attends meetings of clubs or  organizations: Not on file    Relationship status: Not on file  . Intimate partner violence:    Fear of current or ex partner: Not on file    Emotionally abused: Not on file    Physically abused: Not on file    Forced sexual activity: Not on file  Other Topics Concern  . Not on file  Social History Narrative  . Not on file    Review of Systems: See HPI, otherwise negative ROS  Physical Exam: BP 130/86   Pulse 80   Temp (!) 96.3 F (35.7 C) (Tympanic)   Resp 18   Ht  (1.676 m)   Wt 187 lb (84.8 kg)   SpO2 100%   BMI 30.18 kg/m  General:   Alert,  pleasant and cooperative in NAD Head:  Normocephalic and atraumatic. Neck:  Supple; no masses or thyromegaly. Lungs:  Clear throughout to auscultation.    Heart:  Regular rate and rhythm. Abdomen:  Soft, nontender and nondistended. Normal bowel sounds, without guarding, and without rebound.   Neurologic:  Alert and  oriented x4;  grossly normal neurologically.  Impression/Plan: Bernece A Dark is here for an colonoscopy to be performed for colon cancer screening  Risks, benefits, limitations, and alternatives regarding  colonoscopy have been reviewed with the patient.  Questions have been answered.  All parties agreeable.   Lannette Donath, MD  11/01/2017, 10:18 AM

## 2017-11-02 ENCOUNTER — Other Ambulatory Visit: Payer: Self-pay

## 2017-11-02 ENCOUNTER — Ambulatory Visit: Payer: BLUE CROSS/BLUE SHIELD | Admitting: Certified Registered"

## 2017-11-02 ENCOUNTER — Encounter: Payer: Self-pay | Admitting: *Deleted

## 2017-11-02 ENCOUNTER — Encounter
Admission: RE | Disposition: A | Discharge status: Home / Self Care | Payer: Self-pay | Source: Ambulatory Visit | Attending: Gastroenterology

## 2017-11-02 ENCOUNTER — Ambulatory Visit
Admission: RE | Admit: 2017-11-02 | Discharge: 2017-11-02 | Disposition: A | Discharge status: Home / Self Care | Payer: BLUE CROSS/BLUE SHIELD | Source: Ambulatory Visit | Attending: Gastroenterology | Admitting: Gastroenterology

## 2017-11-02 DIAGNOSIS — E559 Vitamin D deficiency, unspecified: Secondary | ICD-10-CM | POA: Diagnosis not present

## 2017-11-02 DIAGNOSIS — J309 Allergic rhinitis, unspecified: Secondary | ICD-10-CM | POA: Insufficient documentation

## 2017-11-02 DIAGNOSIS — Z1211 Encounter for screening for malignant neoplasm of colon: Secondary | ICD-10-CM | POA: Diagnosis present

## 2017-11-02 DIAGNOSIS — Z79899 Other long term (current) drug therapy: Secondary | ICD-10-CM | POA: Diagnosis not present

## 2017-11-02 DIAGNOSIS — Z7951 Long term (current) use of inhaled steroids: Secondary | ICD-10-CM | POA: Diagnosis not present

## 2017-11-02 HISTORY — PX: COLONOSCOPY WITH PROPOFOL: SHX5780

## 2017-11-02 SURGERY — COLONOSCOPY WITH PROPOFOL
Anesthesia: General

## 2017-11-02 MED ORDER — PROPOFOL 10 MG/ML IV BOLUS
INTRAVENOUS | Status: DC | PRN
  Administered 2017-11-02: 70 mg via INTRAVENOUS
  Administered 2017-11-02: 30 mg via INTRAVENOUS
  Administered 2017-11-02: 70 mg via INTRAVENOUS
  Administered 2017-11-02: 10 mg via INTRAVENOUS
  Administered 2017-11-02: 20 mg via INTRAVENOUS

## 2017-11-02 MED ORDER — FENTANYL CITRATE (PF) 100 MCG/2ML IJ SOLN
INTRAMUSCULAR | Status: AC
  Filled 2017-11-02: qty 2

## 2017-11-02 MED ORDER — MIDAZOLAM HCL 2 MG/2ML IJ SOLN
INTRAMUSCULAR | Status: DC | PRN
  Administered 2017-11-02: 2 mg via INTRAVENOUS

## 2017-11-02 MED ORDER — PROPOFOL 500 MG/50ML IV EMUL
INTRAVENOUS | Status: AC
  Filled 2017-11-02: qty 50

## 2017-11-02 MED ORDER — PROPOFOL 500 MG/50ML IV EMUL
INTRAVENOUS | Status: DC | PRN
  Administered 2017-11-02: 140 ug/kg/min via INTRAVENOUS

## 2017-11-02 MED ORDER — SODIUM CHLORIDE 0.9 % IV SOLN
INTRAVENOUS | Status: DC
  Administered 2017-11-02: 11:00:00 via INTRAVENOUS

## 2017-11-02 MED ORDER — FENTANYL CITRATE (PF) 100 MCG/2ML IJ SOLN
INTRAMUSCULAR | Status: DC | PRN
  Administered 2017-11-02: 50 ug via INTRAVENOUS

## 2017-11-02 MED ORDER — LIDOCAINE HCL (PF) 1 % IJ SOLN
INTRAMUSCULAR | Status: AC
  Filled 2017-11-02: qty 2

## 2017-11-02 MED ORDER — LIDOCAINE HCL (PF) 2 % IJ SOLN
INTRAMUSCULAR | Status: AC
  Filled 2017-11-02: qty 10

## 2017-11-02 MED ORDER — LIDOCAINE HCL (CARDIAC) PF 100 MG/5ML IV SOSY
PREFILLED_SYRINGE | INTRAVENOUS | Status: DC | PRN
  Administered 2017-11-02: 50 mg via INTRAVENOUS

## 2017-11-02 MED ORDER — MIDAZOLAM HCL 2 MG/2ML IJ SOLN
INTRAMUSCULAR | Status: AC
  Filled 2017-11-02: qty 2

## 2017-11-02 NOTE — Anesthesia Postprocedure Evaluation (Signed)
Anesthesia Post Note  Patient: Debra Stephens  Procedure(s) Performed: COLONOSCOPY WITH PROPOFOL (N/A )  Patient location during evaluation: Endoscopy Anesthesia Type: General Level of consciousness: awake and alert Pain management: pain level controlled Vital Signs Assessment: post-procedure vital signs reviewed and stable Respiratory status: spontaneous breathing and respiratory function stable Cardiovascular status: stable Anesthetic complications: no     Last Vitals:  Vitals:   11/02/17 1222 11/02/17 1228  BP:  (!) 156/89  Pulse: 83 81  Resp: 11 13  Temp:    SpO2: 100% 100%    Last Pain:  Vitals:   11/02/17 1222  TempSrc:   PainSc: 0-No pain                 KEPHART,WILLIAM K

## 2017-11-02 NOTE — Anesthesia Preprocedure Evaluation (Signed)
Anesthesia Evaluation  Patient identified by MRN, date of birth, ID band Patient awake    Reviewed: Allergy & Precautions, NPO status , Patient's Chart, lab work & pertinent test results  History of Anesthesia Complications Negative for: history of anesthetic complications  Airway Mallampati: II       Dental   Pulmonary neg sleep apnea, neg COPD,           Cardiovascular (-) hypertension(-) Past MI and (-) CHF (-) dysrhythmias (-) Valvular Problems/Murmurs     Neuro/Psych neg Seizures    GI/Hepatic Neg liver ROS, neg GERD  ,  Endo/Other  neg diabetes  Renal/GU negative Renal ROS     Musculoskeletal   Abdominal   Peds  Hematology  (+) anemia ,   Anesthesia Other Findings   Reproductive/Obstetrics                             Anesthesia Physical Anesthesia Plan  ASA: II  Anesthesia Plan: General   Post-op Pain Management:    Induction:   PONV Risk Score and Plan: 3 and Propofol infusion, TIVA and Treatment may vary due to age or medical condition  Airway Management Planned:   Additional Equipment:   Intra-op Plan:   Post-operative Plan:   Informed Consent: I have reviewed the patients History and Physical, chart, labs and discussed the procedure including the risks, benefits and alternatives for the proposed anesthesia with the patient or authorized representative who has indicated his/her understanding and acceptance.     Plan Discussed with:   Anesthesia Plan Comments:         Anesthesia Quick Evaluation

## 2017-11-02 NOTE — Transfer of Care (Signed)
Immediate Anesthesia Transfer of Care Note  Patient: Debra Stephens  Procedure(s) Performed: COLONOSCOPY WITH PROPOFOL (N/A )  Patient Location: PACU  Anesthesia Type:General  Level of Consciousness: sedated and responds to stimulation  Airway & Oxygen Therapy: Patient Spontanous Breathing and Patient connected to nasal cannula oxygen  Post-op Assessment: Report given to RN and Post -op Vital signs reviewed and stable  Post vital signs: Reviewed and stable  Last Vitals:  Vitals Value Taken Time  BP 142/86 11/02/2017 12:08 PM  Temp    Pulse 75 11/02/2017 12:08 PM  Resp 11 11/02/2017 12:08 PM  SpO2 100 % 11/02/2017 12:08 PM    Last Pain:  Vitals:   11/02/17 1040  TempSrc: Tympanic  PainSc: 0-No pain         Complications: No apparent anesthesia complications

## 2017-11-02 NOTE — Anesthesia Procedure Notes (Signed)
Performed by: Takia Runyon, CRNA Pre-anesthesia Checklist: Patient identified, Emergency Drugs available, Suction available, Patient being monitored and Timeout performed Patient Re-evaluated:Patient Re-evaluated prior to induction Oxygen Delivery Method: Nasal cannula Induction Type: IV induction       

## 2017-11-02 NOTE — Op Note (Addendum)
[facility] Gastroenterology Patient Name: Debra Stephens Procedure Date: 11/02/2017 10:47 AM MRN: [id number] Account #: [id number] Date of Birth: [birth year] Admit Type: Outpatient Age: 47 Room: [facility] ENDO ROOM 2 Gender: Female Note Status: Finalized Procedure:            Colonoscopy Indications:          Screening for colorectal malignant neoplasm Providers:            Desia Saban B. Maximino Greenland MD, MD Referring MD:         Kerman Passey (Referring MD) Medicines:            Monitored Anesthesia Care Complications:        No immediate complications. Procedure:            Pre-Anesthesia Assessment:                       - Prior to the procedure, a History and Physical was                        performed, and patient medications, allergies and                        sensitivities were reviewed. The patient's tolerance of                        previous anesthesia was reviewed.                       - The risks and benefits of the procedure and the                        sedation options and risks were discussed with the                        patient. All questions were answered and informed                        consent was obtained.                       - Patient identification and proposed procedure were                        verified prior to the procedure by the physician, the                        nurse, the anesthetist and the technician. The                        procedure was verified in the pre-procedure area in the                        procedure room in the endoscopy suite.                       - ASA Grade Assessment: II - A patient with mild                        systemic disease.                       -  After reviewing the risks and benefits, the patient                        was deemed in satisfactory condition to undergo the                        procedure.                       After obtaining informed consent, the colonoscope was              passed under direct vision. Throughout the procedure,                        the patient's blood pressure, pulse, and oxygen                        saturations were monitored continuously. The                        Colonoscope was introduced through the anus and                        advanced to the the cecum, identified by appendiceal                        orifice and ileocecal valve. The colonoscopy was                        performed with ease. The patient tolerated the                        procedure well. The quality of the bowel preparation                        was good. Findings:      The perianal and digital rectal examinations were normal.      The rectum, sigmoid colon, descending colon, transverse colon, ascending       colon and cecum appeared normal.      The retroflexed view of the distal rectum and anal verge was normal and       showed no anal or rectal abnormalities. Impression:           - The rectum, sigmoid colon, descending colon,                        transverse colon, ascending colon and cecum are normal.                       - The distal rectum and anal verge are normal on                        retroflexion view.                       - No specimens collected. Recommendation:       - Discharge patient to home.                       - Resume previous diet.                       -  Continue present medications.                       - Repeat colonoscopy in 5-10 years for screening                        purposes.                       - Return to primary care physician as previously                        scheduled.                       - The findings and recommendations were discussed with                        the patient.                       - The findings and recommendations were discussed with                        the patient's family. Procedure Code(s):    --- Professional ---                       Z6109, Colorectal cancer  screening; colonoscopy on                        individual not meeting criteria for high risk Diagnosis Code(s):    --- Professional ---                       Z12.11, Encounter for screening for malignant neoplasm                        of colon CPT copyright 2017 American Medical Association. All rights reserved. The codes documented in this report are preliminary and upon coder review may  be revised to meet current compliance requirements.  Melodie Bouillon, MD Michel Bickers B. Maximino Greenland MD, MD 11/02/2017 12:03:03 PM This report has been signed electronically. Number of Addenda: 0 Note Initiated On: 11/02/2017 10:47 AM Scope Withdrawal Time: 0 hours 15 minutes 1 second  Total Procedure Duration: 0 hours 37 minutes 51 seconds       [facility]

## 2017-11-02 NOTE — H&P (Signed)
Vonda Antigua, MD [address], [place], [place], [place], [address] [address], [place], [place], [place], [address] Phone: [phone]  Fax: [phone]  Primary Care Physician:  Arnetha Courser, MD   Pre-Procedure History & Physical: HPI:  Debra Stephens is a 47 y.o. female is here for a colonoscopy.   Past Medical History:  Diagnosis Date  . Abdominal tenderness, right upper quadrant   . Abnormal Pap smear of cervix   . Allergic rhinitis, cause unspecified   . Anemia   . Chronic headaches   . Cough   . Lumbago   . Other and unspecified hyperlipidemia   . Swelling   . Thoracic or lumbosacral neuritis or radiculitis, unspecified   . Unspecified symptom associated with female genital organs   . Unspecified vitamin D deficiency     Past Surgical History:  Procedure Laterality Date  . BREAST LUMPECTOMY Left   . TUBAL LIGATION    . VAGINAL HYSTERECTOMY      Prior to Admission medications   Medication Sig Start Date End Date Taking? Authorizing Provider  acetaminophen (TYLENOL) 325 MG tablet Take 650 mg by mouth as needed.   Yes [provider]  Benzocaine (HURRICAINE) 20 % AERO Use as directed 1 spray in the mouth or throat 4 (four) times daily as needed (sore throat) 08/12/17  Yes [provider]  Diphenhyd-Hydrocort-Nystatin (FIRST-DUKES MOUTHWASH) SUSP Use as directed 5 mLs in the mouth or throat 3 (three) times daily as needed (Sore Throat). Gargle and spit. 08/08/17  Yes Hubbard Hartshorn, FNP  fluticasone (FLONASE) 50 MCG/ACT nasal spray Place 2 sprays into both nostrils daily. 08/08/17  Yes Hubbard Hartshorn, FNP  guaiFENesin-codeine 100-10 MG/5ML syrup Take by mouth. 08/12/17  Yes [provider]  Multiple Vitamin (MULTIVITAMIN WITH MINERALS) TABS tablet Take 1 tablet by mouth daily.   Yes [provider]  phentermine (ADIPEX-P) 37.5 MG tablet Take 1 tablet (37.5 mg total) by mouth daily before breakfast. 08/27/17  Yes Gae Dry, MD  SUPREP BOWEL PREP KIT 17.5-3.13-1.6 GM/177ML SOLN Take 1 kit by mouth as directed. 11/01/17  Yes Vanga, Tally Due, MD  Vitamin D, Ergocalciferol, (DRISDOL) 50000 units CAPS capsule Take 1 capsule (50,000 Units total) by mouth every 7 (seven) days. 07/30/17  Yes Steele Sizer, MD    Allergies as of 11/01/2017 - Review Complete 11/01/2017  Allergen Reaction Noted  . Erythromycin Other (See Comments) 06/18/2013  . Ibuprofen Hives 08/12/2015  . Vibramycin [doxycycline calcium] Hives 06/18/2013    Family History  Problem Relation Age of Onset  . Stroke Mother   . Hypertension Mother   . Heart murmur Mother   . Hypertension Father   . Breast cancer Maternal Aunt   . Lupus Sister   . Lupus Daughter   . Liver cancer Maternal Grandfather   . Cirrhosis Maternal Grandfather   . Hypertension Paternal Grandmother   . Sickle cell anemia Sister   . Thyroid cancer Sister   . Seizures Sister   . Hashimoto's thyroiditis Sister   . Fibromyalgia Sister     Social History   Socioeconomic History  . Marital status: Single    Spouse name: Not on file  . Number of children: Not on file  . Years of education: Not on file  . Highest education level: Not on file  Occupational History  . Not on file  Social Needs  . Financial resource strain: Not on file  . Food insecurity:  Worry: Not on file    Inability: Not on file  . Transportation needs:    Medical: Not on file    Non-medical: Not on file  Tobacco Use  . Smoking status: Never Smoker  . Smokeless tobacco: Never Used  Substance and Sexual Activity  . Alcohol use: No  . Drug use: No  . Sexual activity: Yes  Lifestyle  . Physical activity:    Days per week: Not on file    Minutes per session: Not on file  . Stress: Not on file  Relationships  . Social connections:    Talks on phone: Not on file    Gets together: Not on file    Attends religious service: Not on file    Active member of club or organization:  Not on file    Attends meetings of clubs or organizations: Not on file    Relationship status: Not on file  . Intimate partner violence:    Fear of current or ex partner: Not on file    Emotionally abused: Not on file    Physically abused: Not on file    Forced sexual activity: Not on file  Other Topics Concern  . Not on file  Social History Narrative  . Not on file    Review of Systems: See HPI, otherwise negative ROS  Physical Exam: Ht 5' 6"  (1.676 m)   Wt 187 lb (84.8 kg)   BMI 30.18 kg/m  General:   Alert,  pleasant and cooperative in NAD Head:  Normocephalic and atraumatic. Neck:  Supple; no masses or thyromegaly. Lungs:  Clear throughout to auscultation, normal respiratory effort.    Heart:  +S1, +S2, Regular rate and rhythm, No edema. Abdomen:  Soft, nontender and nondistended. Normal bowel sounds, without guarding, and without rebound.   Neurologic:  Alert and  oriented x4;  grossly normal neurologically.  Impression/Plan: Rayshell A Dark is here for a colonoscopy to be performed for average risk screening.  Risks, benefits, limitations, and alternatives regarding  colonoscopy have been reviewed with the patient.  Questions have been answered.  All parties agreeable.   Virgel Manifold, MD  11/02/2017, 10:44 AM

## 2017-11-02 NOTE — Anesthesia Post-op Follow-up Note (Signed)
Anesthesia QCDR form completed.        

## 2017-11-06 ENCOUNTER — Ambulatory Visit: Payer: BLUE CROSS/BLUE SHIELD | Admitting: Gastroenterology

## 2017-11-06 ENCOUNTER — Encounter: Payer: Self-pay | Admitting: Gastroenterology

## 2017-11-06 ENCOUNTER — Other Ambulatory Visit: Payer: Self-pay

## 2017-11-08 ENCOUNTER — Encounter: Payer: Self-pay | Admitting: Obstetrics & Gynecology

## 2017-11-08 ENCOUNTER — Ambulatory Visit: Payer: BLUE CROSS/BLUE SHIELD | Admitting: Obstetrics & Gynecology

## 2017-11-08 VITALS — BP 140/80 | Ht 66.0 in | Wt 181.0 lb

## 2017-11-08 DIAGNOSIS — E8881 Metabolic syndrome: Secondary | ICD-10-CM

## 2017-11-08 DIAGNOSIS — Z6829 Body mass index (BMI) 29.0-29.9, adult: Secondary | ICD-10-CM | POA: Diagnosis not present

## 2017-11-08 NOTE — Progress Notes (Signed)
  History of Present Illness:  Debra Stephens is a 47 y.o. who was started on  Current Meds  Medication Sig  . phentermine (ADIPEX-P) 37.5 MG tablet Take 1 tablet (37.5 mg total) by mouth daily before breakfast.   approximately 3 months ago. Since that time, she states that her symptoms of weight loss (6 lbs this time frame with intermittent use of meds) have persisted.  Stress from work, daughter, and upcoming marriage discussed.  PMHx: She  has a past medical history of Abdominal tenderness, right upper quadrant, Abnormal Pap smear of cervix, Allergic rhinitis, cause unspecified, Anemia, Chronic headaches, Cough, Lumbago, Other and unspecified hyperlipidemia, Swelling, Thoracic or lumbosacral neuritis or radiculitis, unspecified, Unspecified symptom associated with female genital organs, and Unspecified vitamin D deficiency. Also,  has a past surgical history that includes Vaginal hysterectomy; Tubal ligation; Breast lumpectomy (Left); and Colonoscopy with propofol (N/A, 11/02/2017)., family history includes Breast cancer in her maternal aunt; Cirrhosis in her maternal grandfather; Fibromyalgia in her sister; Hashimoto's thyroiditis in her sister; Heart murmur in her mother; Hypertension in her father, mother, and paternal grandmother; Liver cancer in her maternal grandfather; Lupus in her daughter and sister; Seizures in her sister; Sickle cell anemia in her sister; Stroke in her mother; Thyroid cancer in her sister.,  reports that she has never smoked. She has never used smokeless tobacco. She reports that she does not drink alcohol or use drugs. Current Meds  Medication Sig  . phentermine (ADIPEX-P) 37.5 MG tablet Take 1 tablet (37.5 mg total) by mouth daily before breakfast.  . Also, is allergic to erythromycin; ibuprofen; and vibramycin [doxycycline calcium]..  Review of Systems  All other systems reviewed and are negative.   Physical Exam:  BP 140/80   Ht  (1.676 m)   Wt 181 lb (82.1  kg)   BMI 29.21 kg/m  Body mass index is 29.21 kg/m. Constitutional: Well nourished, well developed female in no acute distress.  Abdomen: diffusely non tender to palpation, non distended, and no masses, hernias Neuro: Grossly intact Psych:  Normal mood and affect.    Assessment:  Problem List Items Addressed This Visit      Other   Dysmetabolic syndrome    Other Visit Diagnoses    BMI 29.0-29.9,adult    -  Primary     Medication treatment is going very well for her weight loss (Obesity, now Overweight w BMI 29).  Plan: She will undergo no change in her medical therapy. She will stop meds in 2 mos and maintain weight with diet, exercise, stress reduction. She was amenable to this plan and we will see her back for annual/PRN.  A total of 15 minutes were spent face-to-face with the patient during this encounter and over half of that time dealt with counseling and coordination of care.  Annamarie Major, MD, [facility], [facility] 11/08/2017  11:48 AM

## 2017-11-20 ENCOUNTER — Encounter: Payer: Self-pay | Admitting: Gastroenterology

## 2017-11-20 ENCOUNTER — Ambulatory Visit: Payer: BLUE CROSS/BLUE SHIELD | Admitting: Gastroenterology

## 2017-11-20 VITALS — BP 126/79 | HR 87 | Resp 17 | Ht 66.0 in | Wt 181.8 lb

## 2017-11-20 DIAGNOSIS — K5904 Chronic idiopathic constipation: Secondary | ICD-10-CM | POA: Diagnosis not present

## 2017-11-20 NOTE — Progress Notes (Signed)
Cephas Darby, MD [address]  [place]  [place], [place] [address]  Main: [phone]  Fax: [phone]    Gastroenterology Consultation  Referring Provider:     Arnetha Courser, MD Primary Care Physician:  Arnetha Courser, MD Primary Gastroenterologist:  Dr. Cephas Darby Reason for Consultation:     Chronic constipation        HPI:   ELON LOMELI is a 47 y.o. female referred by Dr. Arnetha Courser, MD  for consultation & management of chronic constipation for approx 15years. Hard stools, frequency 1-2 times weekly, a/w straining, occasional blood on wiping, bloating. Denies abdominal pain, but have cramps when severe. She has tried MiraLAX, Metamucil, Senokot, Colace as needed which do help with constipation. She drinks sweetened tea daily, consumes red meat on a regular basis. She has a strong family history of autoimmune diseases and her serologies are positive. She is closely followed by rheumatology who does not think she has organ damage yet. She does have chronic microcytosis. Her hemoglobin has been normal, patient reports that she was anemic in the past. Unknown ferritin levels. She is not taking any iron supplementation. Her B12 levels were normal in 2018. She denies any other GI symptoms. She is trying to lose weight, on phentermine. TSH is normal   She currently has ear infection and pharyngitis for which she is on antibiotics.  Follow up visit 11/20/2017 Patient underwent colonoscopy and it was normal. She reports that her constipation has significantly improved with dietary modification, by incorporating more fiber in her diet. She is taking MiraLAX as needed, if she takes MiraLAX daily, is causing diarrhea and cramps.. Currently, she is having formed bowel movements every other day with complete emptying. She denies any complaints today  NSAIDs: None   Antiplts/Anticoagulants/Anti thrombotics: None   GI Procedures: None She does not smoke or drink  alcohol. Strong family of lupus, RA She denies family history of colon cancer or other GI malignancies   Past Medical History:  Diagnosis Date  . Abdominal tenderness, right upper quadrant   . Abnormal Pap smear of cervix   . Allergic rhinitis, cause unspecified   . Anemia   . Chronic headaches   . Cough   . Lumbago   . Other and unspecified hyperlipidemia   . Swelling   . Thoracic or lumbosacral neuritis or radiculitis, unspecified   . Unspecified symptom associated with female genital organs   . Unspecified vitamin D deficiency     Past Surgical History:  Procedure Laterality Date  . BREAST LUMPECTOMY Left   . COLONOSCOPY WITH PROPOFOL N/A 11/02/2017   Procedure: COLONOSCOPY WITH PROPOFOL;  Surgeon: Virgel Manifold, MD;  Location: [facility] ENDOSCOPY;  Service: Endoscopy;  Laterality: N/A;  . TUBAL LIGATION    . VAGINAL HYSTERECTOMY       Current Outpatient Medications:  .  acetaminophen (TYLENOL) 325 MG tablet, Take 650 mg by mouth as needed., Disp: , Rfl:  .  fluticasone (FLONASE) 50 MCG/ACT nasal spray, Place 2 sprays into both nostrils daily., Disp: 16 g, Rfl: 3 .  Multiple Vitamin (MULTIVITAMIN WITH MINERALS) TABS tablet, Take 1 tablet by mouth daily., Disp: , Rfl:  .  phentermine (ADIPEX-P) 37.5 MG tablet, Take 1 tablet (37.5 mg total) by mouth daily before breakfast., Disp: 30 tablet, Rfl: 1 .  Vitamin D, Ergocalciferol, (DRISDOL) 50000 units CAPS capsule, Take 1 capsule (50,000 Units total) by mouth every 7 (seven) days., Disp: 12 capsule, Rfl:  0 .  b complex vitamins capsule, Take by mouth., Disp: , Rfl:  .  Benzocaine (HURRICAINE) 20 % AERO, Use as directed 1 spray in the mouth or throat 4 (four) times daily as needed (sore throat), Disp: , Rfl:  .  Diphenhyd-Hydrocort-Nystatin (FIRST-DUKES MOUTHWASH) SUSP, Use as directed 5 mLs in the mouth or throat 3 (three) times daily as needed (Sore Throat). Gargle and spit. (Patient not taking: Reported on 11/20/2017), Disp: 237  mL, Rfl: 0 .  GAVILYTE-G 236 g solution, , Disp: , Rfl: 0 .  guaiFENesin-codeine 100-10 MG/5ML syrup, Take by mouth., Disp: , Rfl:  .  SUPREP BOWEL PREP KIT 17.5-3.13-1.6 GM/177ML SOLN, Take 1 kit by mouth as directed. (Patient not taking: Reported on 11/08/2017), Disp: 2 Bottle, Rfl: 0   Family History  Problem Relation Age of Onset  . Stroke Mother   . Hypertension Mother   . Heart murmur Mother   . Hypertension Father   . Breast cancer Maternal Aunt   . Lupus Sister   . Lupus Daughter   . Liver cancer Maternal Grandfather   . Cirrhosis Maternal Grandfather   . Hypertension Paternal Grandmother   . Sickle cell anemia Sister   . Thyroid cancer Sister   . Seizures Sister   . Hashimoto's thyroiditis Sister   . Fibromyalgia Sister      Social History   Tobacco Use  . Smoking status: Never Smoker  . Smokeless tobacco: Never Used  Substance Use Topics  . Alcohol use: No  . Drug use: No    Allergies as of 11/20/2017 - Review Complete 11/20/2017  Allergen Reaction Noted  . Erythromycin Other (See Comments) 06/18/2013  . Ibuprofen Hives 08/12/2015  . Vibramycin [doxycycline calcium] Hives 06/18/2013    Review of Systems:    All systems reviewed and negative except where noted in HPI.   Physical Exam:  BP 126/79 (BP Location: Left Arm, Patient Position: Sitting, Cuff Size: Normal)   Pulse 87   Resp 17   Ht 5' 6"  (1.676 m)   Wt 181 lb 12.8 oz (82.5 kg)   BMI 29.34 kg/m  No LMP recorded. Patient has had a hysterectomy.  General:   Alert,  Well-developed, well-nourished, pleasant and cooperative in NAD Head:  Normocephalic and atraumatic. Eyes:  Sclera clear, no icterus.   Conjunctiva pink. Ears:  Normal auditory acuity. Nose:  No deformity, discharge, or lesions. Mouth:  No deformity or lesions,oropharynx pink & moist. Neck:  Supple; no masses or thyromegaly. Lungs:  Respirations even and unlabored.  Clear throughout to auscultation.   No wheezes, crackles, or  rhonchi. No acute distress. Heart:  Regular rate and rhythm; no murmurs, clicks, rubs, or gallops. Abdomen:  Normal bowel sounds. Soft, non-tender and distended, tympanitic without masses, hepatosplenomegaly or hernias noted.  No guarding or rebound tenderness.   Rectal: Not performed Msk:  Symmetrical without gross deformities. Good, equal movement & strength bilaterally. Pulses:  Normal pulses noted. Extremities:  No clubbing or edema.  No cyanosis. Neurologic:  Alert and oriented x3;  grossly normal neurologically. Skin:  Intact without significant lesions or rashes. No jaundice. Psych:  Alert and cooperative. Normal mood and affect.  Imaging Studies: No abdominal imaging   Assessment and Plan:   JONELLA REDDITT is a 47 y.o. African-American  female seen for follow-up of for chronic constipation   Chronic constipation: Most likely idiopathic - continue high-fiber diet and fiber supplements - Avoid red meat and sweetened tea - she is willing  to try prescription medication for constipation other than MiraLAX - Will try prucalopride 2 mg daily, samples provided  Colon cancer screening:  - Normal colonoscopy - Recommend surveillance colonoscopy in 10 years   Microcytosis: iron studies are normal, with normal hemoglobin   Follow up in 3 months   Cephas Darby, MD

## 2017-11-27 ENCOUNTER — Ambulatory Visit: Payer: BLUE CROSS/BLUE SHIELD | Admitting: Gastroenterology

## 2017-12-14 ENCOUNTER — Ambulatory Visit: Payer: BLUE CROSS/BLUE SHIELD | Admitting: Family Medicine

## 2017-12-14 ENCOUNTER — Encounter: Payer: Self-pay | Admitting: Family Medicine

## 2017-12-14 VITALS — BP 118/72 | HR 83 | Temp 97.8°F | Resp 16 | Ht 66.0 in | Wt 181.3 lb

## 2017-12-14 DIAGNOSIS — B370 Candidal stomatitis: Secondary | ICD-10-CM

## 2017-12-14 DIAGNOSIS — J040 Acute laryngitis: Secondary | ICD-10-CM | POA: Diagnosis not present

## 2017-12-14 DIAGNOSIS — J329 Chronic sinusitis, unspecified: Secondary | ICD-10-CM | POA: Insufficient documentation

## 2017-12-14 DIAGNOSIS — R05 Cough: Secondary | ICD-10-CM

## 2017-12-14 DIAGNOSIS — R059 Cough, unspecified: Secondary | ICD-10-CM

## 2017-12-14 MED ORDER — GUAIFENESIN ER 600 MG PO TB12: 1200.0000 mg | ORAL_TABLET | Freq: Two times a day (BID) | ORAL | 0 refills | Status: AC | PRN

## 2017-12-14 MED ORDER — CLOTRIMAZOLE 10 MG MT TROC: 10.0000 mg | Freq: Every day | OROMUCOSAL | 0 refills | Status: AC

## 2017-12-14 MED ORDER — PREDNISONE 10 MG PO TABS: ORAL_TABLET | ORAL | 0 refills | Status: AC

## 2017-12-14 NOTE — Progress Notes (Signed)
Name: Debra Stephens   MRN: [id number]    DOB: Oct 18, 1970   Date:12/14/2017       Progress Note  Subjective  Chief Complaint  Chief Complaint  Patient presents with  . Sinusitis    cough, congested, tongue sore, no energy    HPI  Pt presents with concern for sinus infection - she has been symptomatic for about 7 days.  She has cough (productive, thick phlegm), sore throat, tongue has thicker white coating, fatigue, hoarse voice, lack of appetite.  Endorses right upper chest pain with certain movements only.  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  No recent international travel or tick bites.   Patient Active Problem List   Diagnosis Date Noted  . Recurrent sinusitis 12/14/2017  . Dysmetabolic syndrome 11/08/2017  . Special screening for malignant neoplasms, colon   . Chronic idiopathic constipation 08/13/2017  . Obesity (BMI 30-39.9) 07/27/2017  . 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 Vitamin D deficiency 11/22/2016  . ANA positive 05/24/2016  . Acute non-recurrent maxillary sinusitis 05/17/2016  . H/O urticaria 09/08/2015  . Angio-edema 09/08/2015  . Atypical chest pain 06/24/2013    Social History   Tobacco Use  . Smoking status: Never Smoker  . Smokeless tobacco: Never Used  Substance Use Topics  . Alcohol use: No     Current Outpatient Medications:  .  acetaminophen (TYLENOL) 325 MG tablet, Take 650 mg by mouth as needed., Disp: , Rfl:  .  fluticasone (FLONASE) 50 MCG/ACT nasal spray, Place 2 sprays into both nostrils daily., Disp: 16 g, Rfl: 3 .  Multiple Vitamin (MULTIVITAMIN WITH MINERALS) TABS tablet, Take 1 tablet by mouth daily., Disp: , Rfl:  .  phentermine (ADIPEX-P) 37.5 MG tablet, Take 1 tablet (37.5 mg total) by mouth daily before breakfast., Disp: 30 tablet, Rfl: 1 .  Vitamin D, Ergocalciferol, (DRISDOL) 50000 units CAPS capsule,  Take 1 capsule (50,000 Units total) by mouth every 7 (seven) days., Disp: 12 capsule, Rfl: 0  Allergies  Allergen Reactions  . Erythromycin Other (See Comments)  . Ibuprofen Hives  . Vibramycin [Doxycycline Calcium] Hives    ROS  Constitutional: Negative for fever or weight change.  Respiratory: Positive for cough; negative for shortness of breath.   Cardiovascular: Negative for palpitations.  Gastrointestinal: Negative for abdominal pain, no bowel changes.  Musculoskeletal: Negative for gait problem or joint swelling.  Skin: Negative for rash.  Neurological: Negative for dizziness or headache.  No other specific complaints in a complete review of systems (except as listed in HPI above).  Objective  Vitals:   12/14/17 0747  BP: 118/72  Pulse: 83  Resp: 16  Temp: 97.8 F (36.6 C)  TempSrc: Oral  SpO2: 98%  Weight: 181 lb 4.8 oz (82.2 kg)  Height: 5\' 6"  (1.676 m)   Body mass index is 29.26 kg/m.  Nursing Note and Vital Signs reviewed.  Physical Exam  Constitutional: Patient appears well-developed and well-nourished. Obese No distress.  HEENT: head atraumatic, normocephalic, pupils equal and reactive to light, EOM's intact, TM's without erythema or bulging, no maxillary or frontal sinus tenderness, neck supple without lymphadenopathy, tongue has white coating consistent with candida infection; voice is notably hoarse. Cardiovascular: Normal rate, regular rhythm, S1/S2 present.  No murmur or rub heard. No BLE edema. Pulmonary/Chest: Effort normal and breath sounds clear. No respiratory distress or retractions. Psychiatric: Patient has  a normal mood and affect. behavior is normal. Judgment and thought content normal.  No results found for this or any previous visit (from the past 72 hour(s)).  Assessment & Plan   1. Laryngitis, acute - guaiFENesin (MUCINEX) 600 MG 12 hr tablet; Take 2 tablets (1,200 mg total) by mouth 2 (two) times daily as needed for up to 10 days.   Dispense: 20 tablet; Refill: 0 - predniSONE (DELTASONE) 10 MG tablet; Take 6 tablets (60 mg total) by mouth daily with breakfast for 1 day, THEN 5 tablets (50 mg total) daily with breakfast for 1 day, THEN 4 tablets (40 mg total) daily with breakfast for 1 day, THEN 3 tablets (30 mg total) daily with breakfast for 1 day, THEN 2 tablets (20 mg total) daily with breakfast for 1 day, THEN 1 tablet (10 mg total) daily with breakfast for 1 day.  Dispense: 21 tablet; Refill: 0  2. Oral candidiasis - clotrimazole (MYCELEX) 10 MG troche; Take 1 tablet (10 mg total) by mouth 5 (five) times daily for 10 days.  Dispense: 50 tablet; Refill: 0  3. Cough - guaiFENesin (MUCINEX) 600 MG 12 hr tablet; Take 2 tablets (1,200 mg total) by mouth 2 (two) times daily as needed for up to 10 days.  Dispense: 20 tablet; Refill: 0  - She has tessalon at home and declines Rx today. -Red flags and when to present for emergency care or RTC including fever >101.47F, chest pain, shortness of breath, new/worsening/un-resolving symptoms, reviewed with patient at time of visit. Follow up and care instructions discussed and provided in AVS.

## 2017-12-14 NOTE — Patient Instructions (Signed)
Laryngitis Laryngitis is swelling (inflammation) of your vocal cords. This causes hoarseness, coughing, loss of voice, sore throat, or a dry throat. When your vocal cords are inflamed, your voice sounds different. Laryngitis can be temporary (acute) or long-term (chronic). Most cases of acute laryngitis improve with time. Chronic laryngitis is laryngitis that lasts for more than three weeks. Follow these instructions at home:  Drink enough fluid to keep your pee (urine) clear or pale yellow.  Breathe in moist air. Use a humidifier if you live in a dry climate.  Take medicines only as told by your doctor.  Do not smoke cigarettes or electronic cigarettes. If you need help quitting, ask your doctor.  Talk as little as possible. Also avoid whispering, which can cause vocal strain.  Write instead of talking. Do this until your voice is back to normal. Contact a doctor if:  You have a fever.  Your pain is worse.  You have trouble swallowing. Get help right away if:  You cough up blood.  You have trouble breathing. This information is not intended to replace advice given to you by your health care provider. Make sure you discuss any questions you have with your health care provider. Document Released: 05/18/2011 Document Revised: 11/04/2015 Document Reviewed: 11/11/2013 Elsevier Interactive Patient Education  2018 Elsevier Inc.  

## 2018-01-16 ENCOUNTER — Ambulatory Visit
Admission: RE | Admit: 2018-01-16 | Discharge: 2018-01-16 | Disposition: A | Discharge status: Home / Self Care | Payer: BLUE CROSS/BLUE SHIELD | Source: Ambulatory Visit | Attending: Obstetrics & Gynecology | Admitting: Obstetrics & Gynecology

## 2018-01-16 DIAGNOSIS — Z1231 Encounter for screening mammogram for malignant neoplasm of breast: Secondary | ICD-10-CM | POA: Insufficient documentation

## 2018-01-16 DIAGNOSIS — Z1239 Encounter for other screening for malignant neoplasm of breast: Secondary | ICD-10-CM

## 2018-01-29 ENCOUNTER — Ambulatory Visit: Payer: BLUE CROSS/BLUE SHIELD | Admitting: Family Medicine

## 2018-01-29 ENCOUNTER — Encounter: Payer: Self-pay | Admitting: Family Medicine

## 2018-01-29 VITALS — BP 130/84 | HR 92 | Temp 98.9°F | Ht 66.0 in | Wt 184.8 lb

## 2018-01-29 DIAGNOSIS — E538 Deficiency of other specified B group vitamins: Secondary | ICD-10-CM | POA: Insufficient documentation

## 2018-01-29 DIAGNOSIS — T783XXS Angioneurotic edema, sequela: Secondary | ICD-10-CM | POA: Diagnosis not present

## 2018-01-29 DIAGNOSIS — E559 Vitamin D deficiency, unspecified: Secondary | ICD-10-CM | POA: Diagnosis not present

## 2018-01-29 DIAGNOSIS — R768 Other specified abnormal immunological findings in serum: Secondary | ICD-10-CM

## 2018-01-29 DIAGNOSIS — Z23 Encounter for immunization: Secondary | ICD-10-CM | POA: Diagnosis not present

## 2018-01-29 DIAGNOSIS — J329 Chronic sinusitis, unspecified: Secondary | ICD-10-CM | POA: Diagnosis not present

## 2018-01-29 DIAGNOSIS — R718 Other abnormality of red blood cells: Secondary | ICD-10-CM

## 2018-01-29 NOTE — Assessment & Plan Note (Signed)
Check level in near future; suggested OTC supplement

## 2018-01-29 NOTE — Assessment & Plan Note (Signed)
Evaluated by rheum; willing to recheck in the future if new symptoms

## 2018-01-29 NOTE — Patient Instructions (Addendum)
Try taking vitamin B12 250 or 500 mcg daily when needed Start back on anti-histamine Return for labs in the next month or two after finishing the vitamin D

## 2018-01-29 NOTE — Assessment & Plan Note (Signed)
Return for thal panel, suspicious as normal ferritin level and iron level with microcytosis; inheritable, nothing to worry about but will confirm

## 2018-01-29 NOTE — Assessment & Plan Note (Signed)
Check quant immunoglobulins

## 2018-01-29 NOTE — Assessment & Plan Note (Signed)
idiopathic 

## 2018-01-29 NOTE — Progress Notes (Signed)
BP 130/84   Pulse 92   Temp 98.9 F (37.2 C)   Ht 5\' 6"  (1.676 m)   Wt 184 lb 12.8 oz (83.8 kg)   SpO2 97%   BMI 29.83 kg/m    Subjective:    Patient ID: Debra Stephens, female    DOB: Dec 28, 1970, 47 y.o.   MRN: [id number]  HPI: Debra Stephens is a 47 y.o. female  Chief Complaint  Patient presents with  . Follow-up    HPI Patient is new to me; previous provider left our practice Several sick visit since Dr. Sherryll BurgerShah left; has seen NP She has been under stress; good and bad stress, moved in February, sick in March and going into April; got married in July Still has some stuff going on with her throat; starts draining down the back of hte throat; constantly spitting up stuff; using nasal spray is not helping; throat starts to hurt; not using antihistamine right now Daughter has lupus; rash on the upper left breast; using benadryl and that helps; was really itching; unusual location for poison ivy; sister has lupus, another sister has other Hashimoto's thyroiditis, other auto-immune disease; patient was tested; one spot of vitiligo on the left bicep; seeing dermatologist, might do a biopsy  Used to be anemic, was anemic for quite some time; years after having children, then started to get better with iron Vitamin B12 334  Vitamin D was only 14; finishing Rx weekly vitamin D  Angioedema 3x and never new what it was from Lots of testing; NOT allergic to PCN  [facility] 01/29/2018 12/14/2017 07/27/2017 07/10/2017 03/02/2016  Decreased Interest 0 0 0 0 0  Down, Depressed, Hopeless 0 0 0 0 0  PHQ - 2 Score 0 0 0 0 0  Altered sleeping - 0 - - -  Tired, decreased energy - 0 - - -  Change in appetite - 0 - - -  Feeling bad or failure about yourself  - 0 - - -  Trouble concentrating - 0 - - -  Moving slowly or fidgety/restless - 0 - - -  Suicidal thoughts - 0 - - -  PHQ-9 Score - 0 - - -    Relevant past medical, surgical, family and social history reviewed Past  Medical History:  Diagnosis Date  . Abdominal tenderness, right upper quadrant   . Abnormal Pap smear of cervix   . Allergic rhinitis, cause unspecified   . Anemia   . Chronic headaches   . Cough   . Lumbago   . Other and unspecified hyperlipidemia   . Swelling   . Thoracic or lumbosacral neuritis or radiculitis, unspecified   . Unspecified symptom associated with female genital organs   . Unspecified vitamin D deficiency    Past Surgical History:  Procedure Laterality Date  . BREAST LUMPECTOMY Left   . COLONOSCOPY WITH PROPOFOL N/A 11/02/2017   Procedure: COLONOSCOPY WITH PROPOFOL;  Surgeon: Pasty Spillersahiliani, Varnita B, MD;  Location: [facility] ENDOSCOPY;  Service: Endoscopy;  Laterality: N/A;  . TUBAL LIGATION    . VAGINAL HYSTERECTOMY     Family History  Problem Relation Age of Onset  . Stroke Mother   . Hypertension Mother   . Heart murmur Mother   . Hypertension Father   . Breast cancer Maternal Aunt   . Lupus Sister   . Lupus Daughter   . Liver cancer Maternal Grandfather   . Cirrhosis Maternal Grandfather   . Hypertension Paternal Grandmother   .  Sickle cell anemia Sister   . Thyroid cancer Sister   . Seizures Sister   . Hashimoto's thyroiditis Sister   . Fibromyalgia Sister    Social History   Tobacco Use  . Smoking status: Never Smoker  . Smokeless tobacco: Never Used  Substance Use Topics  . Alcohol use: No  . Drug use: No    Interim medical history since last visit reviewed. Allergies and medications reviewed  Review of Systems Per HPI unless specifically indicated above     Objective:    BP 130/84   Pulse 92   Temp 98.9 F (37.2 C)   Ht 5\' 6"  (1.676 m)   Wt 184 lb 12.8 oz (83.8 kg)   SpO2 97%   BMI 29.83 kg/m   Wt Readings from Last 3 Encounters:  01/29/18 184 lb 12.8 oz (83.8 kg)  12/14/17 181 lb 4.8 oz (82.2 kg)  11/20/17 181 lb 12.8 oz (82.5 kg)    Physical Exam  Constitutional: She appears well-developed and well-nourished.  HENT:    Mouth/Throat: Mucous membranes are normal.  Eyes: EOM are normal. No scleral icterus.  Cardiovascular: Normal rate and regular rhythm.  Pulmonary/Chest: Effort normal and breath sounds normal.  Skin: Rash (lateral anterior LEFT side chest; slightly papular, erythematous, resembing hives) noted.  Psychiatric: She has a normal mood and affect. Her behavior is normal.    Results for orders placed or performed during the hospital encounter of 09/29/17  Basic metabolic panel  Result Value Ref Range   Sodium 138 135 - 145 mmol/L   Potassium 3.8 3.5 - 5.1 mmol/L   Chloride 106 101 - 111 mmol/L   CO2 28 22 - 32 mmol/L   Glucose, Bld 104 (H) 65 - 99 mg/dL   BUN 14 6 - 20 mg/dL   Creatinine, Ser 2.84 0.44 - 1.00 mg/dL   Calcium 8.5 (L) 8.9 - 10.3 mg/dL   GFR calc non Af Amer >60 >60 mL/min   GFR calc Af Amer >60 >60 mL/min   Anion gap 4 (L) 5 - 15  CBC  Result Value Ref Range   WBC 3.7 3.6 - 11.0 K/uL   RBC 5.33 (H) 3.80 - 5.20 MIL/uL   Hemoglobin 12.8 12.0 - 16.0 g/dL   HCT 13.2 44.0 - 10.2 %   MCV 73.6 (L) 80.0 - 100.0 fL   MCH 24.0 (L) 26.0 - 34.0 pg   MCHC 32.7 32.0 - 36.0 g/dL   RDW 72.5 (H) 36.6 - 44.0 %   Platelets 199 150 - 440 K/uL  Troponin I  Result Value Ref Range   Troponin I <0.03 <0.03 ng/mL  Troponin I  Result Value Ref Range   Troponin I <0.03 <0.03 ng/mL      Assessment & Plan:   Problem List Items Addressed This Visit      Respiratory   Recurrent sinusitis    Check quant immunoglobulins      Relevant Orders   IgG, IgA, IgM     Other   Vitamin D deficiency - Primary    Check level in the new future      Relevant Orders   VITAMIN D 25 Hydroxy (Vit-D Deficiency, Fractures)   Vitamin B12 deficiency    Check level in near future; suggested OTC supplement      Relevant Orders   Vitamin B12   Microcytosis    Return for thal panel, suspicious as normal ferritin level and iron level with microcytosis; inheritable, nothing to worry  about but will  confirm      Relevant Orders   Hemoglobinopathy evaluation   Angio-edema    idiopathic      ANA positive    Evaluated by rheum; willing to recheck in the future if new symptoms       Other Visit Diagnoses    Need for Tdap vaccination       Relevant Orders   Tdap vaccine greater than or equal to 7yo IM (Completed)   Need for influenza vaccination       Relevant Orders   Flu Vaccine QUAD 6+ mos PF IM (Fluarix Quad PF) (Completed)       Follow up plan: No follow-ups on file.  An after-visit summary was printed and given to the patient at check-out.  Please see the patient instructions which may contain other information and recommendations beyond what is mentioned above in the assessment and plan.  No orders of the defined types were placed in this encounter.   Orders Placed This Encounter  Procedures  . Flu Vaccine QUAD 6+ mos PF IM (Fluarix Quad PF)  . Tdap vaccine greater than or equal to 7yo IM  . Hemoglobinopathy evaluation  . VITAMIN D 25 Hydroxy (Vit-D Deficiency, Fractures)  . Vitamin B12  . IgG, IgA, IgM   Order: quant Ig, vit B12, vit D, CBC, thal panel

## 2018-01-29 NOTE — Assessment & Plan Note (Signed)
Check level in the new future

## 2018-01-31 ENCOUNTER — Other Ambulatory Visit: Payer: Self-pay

## 2018-01-31 DIAGNOSIS — R718 Other abnormality of red blood cells: Secondary | ICD-10-CM

## 2018-02-22 ENCOUNTER — Ambulatory Visit: Payer: BLUE CROSS/BLUE SHIELD | Admitting: Family Medicine

## 2018-02-22 ENCOUNTER — Ambulatory Visit: Payer: BLUE CROSS/BLUE SHIELD | Admitting: Gastroenterology

## 2018-12-06 ENCOUNTER — Ambulatory Visit: Payer: Self-pay

## 2018-12-06 NOTE — Telephone Encounter (Signed)
Pt called stating that her daughter and grand child are being tested for COVID-19 for exposure at pre-school. She states that she has been hours with her daughter and grandchild. She states that they have hugged and kissed.  She states that she has had fever off and on. She has nasal congestion and dry cough she has explained as allergies. She is requesting COVID-19 testing. Care advice read to patient. Patient verbalized understanding. Call transferred to office for scheduling. Reason for Disposition . [1] COVID-19 EXPOSURE AND [2] 15 or more days ago AND [3] NO symptoms  Answer Assessment - Initial Assessment Questions 1. CLOSE CONTACT: "Who is the person with the confirmed or suspected COVID-19 infection that you were exposed to?"     Confirmed person is grand daughters principle 2. PLACE of CONTACT: "Where were you when you were exposed to COVID-19?" (e.g., home, school, medical waiting room; which city?)    Home  3. TYPE of CONTACT: "How much contact was there?" (e.g., sitting next to, live in same house, work in same office, same building)     visiting 4. DURATION of CONTACT: "How long were you in contact with the COVID-19 patient?" (e.g., a few seconds, passed by person, a few minutes, live with the patient)    Today 4 hours granddaughters pre school 5. DATE of CONTACT: "When did you have contact with a COVID-19 patient?" (e.g., how many days ago)    Several hours yesterday 6. TRAVEL: "Have you traveled out of the country recently?" If so, "When and where?"     * Also ask about out-of-state travel, since the CDC has identified some high-risk cities for community spread in the [place].     * Note: Travel becomes less relevant if there is widespread community transmission where the patient lives.     No 7. COMMUNITY SPREAD: "Are there lots of cases of COVID-19 (community spread) where you live?" (See public health department website, if unsure)       yes 8. SYMPTOMS: "Do you have any symptoms?"  (e.g., fever, cough, breathing difficulty)    Fever,cough, 9. PREGNANCY OR POSTPARTUM: "Is there any chance you are pregnant?" "When was your last menstrual period?" "Did you deliver in the last 2 weeks?"     No 10. HIGH RISK: "Do you have any heart or lung problems? Do you have a weak immune system?" (e.g., CHF, COPD, asthma, HIV positive, chemotherapy, renal failure, diabetes mellitus, sickle cell anemia)     none  Protocols used: CORONAVIRUS (COVID-19) EXPOSURE-A-AH

## 2018-12-07 ENCOUNTER — Other Ambulatory Visit: Payer: Self-pay | Admitting: Family Medicine

## 2018-12-07 DIAGNOSIS — Z20822 Contact with and (suspected) exposure to covid-19: Secondary | ICD-10-CM

## 2018-12-09 ENCOUNTER — Other Ambulatory Visit: Payer: Self-pay

## 2018-12-09 ENCOUNTER — Ambulatory Visit (INDEPENDENT_AMBULATORY_CARE_PROVIDER_SITE_OTHER): Payer: BLUE CROSS/BLUE SHIELD | Admitting: Nurse Practitioner

## 2018-12-09 ENCOUNTER — Encounter: Payer: Self-pay | Admitting: Nurse Practitioner

## 2018-12-09 VITALS — Temp 98.1°F

## 2018-12-09 DIAGNOSIS — R0981 Nasal congestion: Secondary | ICD-10-CM

## 2018-12-09 DIAGNOSIS — B379 Candidiasis, unspecified: Secondary | ICD-10-CM

## 2018-12-09 DIAGNOSIS — T3695XA Adverse effect of unspecified systemic antibiotic, initial encounter: Secondary | ICD-10-CM

## 2018-12-09 DIAGNOSIS — J0110 Acute frontal sinusitis, unspecified: Secondary | ICD-10-CM

## 2018-12-09 MED ORDER — DM-GUAIFENESIN ER 30-600 MG PO TB12: 1.0000 | ORAL_TABLET | Freq: Two times a day (BID) | ORAL | 0 refills | Status: DC

## 2018-12-09 MED ORDER — FLUTICASONE PROPIONATE 50 MCG/ACT NA SUSP: 2.0000 | Freq: Every day | NASAL | 6 refills | Status: DC

## 2018-12-09 MED ORDER — AMOXICILLIN-POT CLAVULANATE 875-125 MG PO TABS: 1.0000 | ORAL_TABLET | Freq: Two times a day (BID) | ORAL | 0 refills | Status: DC

## 2018-12-09 MED ORDER — FLUCONAZOLE 150 MG PO TABS: 150.0000 mg | ORAL_TABLET | Freq: Once | ORAL | 1 refills | Status: AC

## 2018-12-09 NOTE — Telephone Encounter (Signed)
Please advise that [facility] is only testing those with underlying conditions, but [facility] is testing a much broader population.  Please call [facility]'s COVID-19 hotline at [phone] to determine if you are eligible for testing.

## 2018-12-09 NOTE — Telephone Encounter (Signed)
Pt had virtual visit with Benjamine Mola NP 12/09/18 and per Benjamine Mola patient had testing ordered through the health dept.

## 2018-12-09 NOTE — Progress Notes (Addendum)
Virtual Visit via Telephone Note  I connected with Debra Stephens on 12/09/18 at  8:40 AM EDT by a enabled telephone visit and verified that I am speaking with the correct person using two identifiers.   Staff discussed the limitations of evaluation and management by telemedicine and the availability of in person appointments. The patient expressed understanding and agreed to proceed.  Patient location: home  My location: work office Other people present:  none HPI  Over the past few weeks she has had worsening allergy symptoms for over 2 weeks- coughing, lots of nasal congestion- blowing lots of thick yellow mucous. Has been using flonase daily for the past few weeks and taking antihistamine and using mucinex and . Patient states her nose is so dry she is noticing trace blood when she blows her nose. Patient notices yellow-white tongue film, has been brushing tongue more. States she feels like she can smell her cold- states the tongue condition resolved with baking soda rinses. States she has had headaches and lots of sinus pressure. States felt feverish a few days ago- resolved with tylenol. The left side cheek is puffy a bit, states she has bilateral maxillary and frontal sinus tenderness. Grandaughters day care had positive COVID case.   PHQ2/9: Depression screen [facility] 12/09/2018 01/29/2018 12/14/2017 07/27/2017 07/10/2017  Decreased Interest 0 0 0 0 0  Down, Depressed, Hopeless 0 0 0 0 0  PHQ - 2 Score 0 0 0 0 0  Altered sleeping 0 - 0 - -  Tired, decreased energy 0 - 0 - -  Change in appetite 0 - 0 - -  Feeling bad or failure about yourself  0 - 0 - -  Trouble concentrating 0 - 0 - -  Moving slowly or fidgety/restless 0 - 0 - -  Suicidal thoughts 0 - 0 - -  PHQ-9 Score 0 - 0 - -  Difficult doing work/chores Not difficult at all - - - -     PHQ reviewed. Negative  Patient Active Problem List   Diagnosis Date Noted  . Vitamin B12 deficiency 01/29/2018  . Microcytosis 01/29/2018   . Recurrent sinusitis 12/14/2017  . Dysmetabolic syndrome 11/08/2017  . Special screening for malignant neoplasms, colon   . Chronic idiopathic constipation 08/13/2017  . Obesity (BMI 30-39.9) 07/27/2017  . 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 Vitamin D deficiency 11/22/2016  . ANA positive 05/24/2016  . H/O urticaria 09/08/2015  . Angio-edema 09/08/2015  . Atypical chest pain 06/24/2013    Past Medical History:  Diagnosis Date  . Abdominal tenderness, right upper quadrant   . Abnormal Pap smear of cervix   . Allergic rhinitis, cause unspecified   . Anemia   . Chronic headaches   . Cough   . Lumbago   . Other and unspecified hyperlipidemia   . Swelling   . Thoracic or lumbosacral neuritis or radiculitis, unspecified   . Unspecified symptom associated with female genital organs   . Unspecified vitamin D deficiency     Past Surgical History:  Procedure Laterality Date  . BREAST LUMPECTOMY Left   . COLONOSCOPY WITH PROPOFOL N/A 11/02/2017   Procedure: COLONOSCOPY WITH PROPOFOL;  Surgeon: Pasty Spillersahiliani, Varnita B, MD;  Location: [facility] ENDOSCOPY;  Service: Endoscopy;  Laterality: N/A;  . TUBAL LIGATION    . VAGINAL HYSTERECTOMY      Social History   Tobacco Use  . Smoking status: Never Smoker  . Smokeless tobacco: Never Used  Substance Use Topics  .  Alcohol use: No     Current Outpatient Medications:  .  acetaminophen (TYLENOL) 325 MG tablet, Take 650 mg by mouth as needed., Disp: , Rfl:  .  fluticasone (FLONASE) 50 MCG/ACT nasal spray, Place 2 sprays into both nostrils daily., Disp: 16 g, Rfl: 3 .  Multiple Vitamin (MULTIVITAMIN WITH MINERALS) TABS tablet, Take 1 tablet by mouth daily., Disp: , Rfl:  .  Vitamin D, Ergocalciferol, (DRISDOL) 50000 units CAPS capsule, Take 1 capsule (50,000 Units total) by mouth every 7 (seven) days. (Patient not taking: Reported on 12/09/2018), Disp: 12 capsule, Rfl: 0  Allergies  Allergen Reactions  . Erythromycin Other (See  Comments)  . Ibuprofen Hives  . Vibramycin [Doxycycline Calcium] Hives    ROS   No other specific complaints in a complete review of systems (except as listed in HPI above).  Objective  Vitals:   12/09/18 0839  Temp: 98.1 F (36.7 C)  TempSrc: Temporal     There is no height or weight on file to calculate BMI.  Nursing Note and Vital Signs reviewed.  Physical Exam  Able to speak full sentences.    Assessment & Plan  1. Acute non-recurrent frontal sinusitis - amoxicillin-clavulanate (AUGMENTIN) 875-125 MG tablet; Take 1 tablet by mouth 2 (two) times daily.  Dispense: 20 tablet; Refill: 0 - dextromethorphan-guaiFENesin (MUCINEX DM) 30-600 MG 12hr tablet; Take 1 tablet by mouth 2 (two) times daily.  Dispense: 20 tablet; Refill: 0  Due to covid and awaiting testing- recommend self isolation and daily temperature checks for at least 10 days. Can return to work if symptoms have improved and she has been fever free for more thant3 days without tylenol or ibuprofen on or ager 12/17/2018   Follow Up Instructions:    I discussed the assessment and treatment plan with the patient. The patient was provided an opportunity to ask questions and all were answered. The patient agreed with the plan and demonstrated an understanding of the instructions.   The patient was advised to call back or seek an in-person evaluation if the symptoms worsen or if the condition fails to improve as anticipated.  I provided 16 minutes of non-face-to-face time during this encounter.   Fredderick Severance, NP

## 2018-12-14 LAB — NOVEL CORONAVIRUS, NAA: SARS-CoV-2, NAA: NOT DETECTED

## 2019-03-03 ENCOUNTER — Other Ambulatory Visit: Payer: Self-pay

## 2019-03-04 ENCOUNTER — Other Ambulatory Visit: Payer: Self-pay

## 2019-03-04 ENCOUNTER — Ambulatory Visit
Admission: RE | Admit: 2019-03-04 | Discharge: 2019-03-04 | Disposition: A | Discharge status: Home / Self Care | Payer: Self-pay | Source: Ambulatory Visit | Attending: Oncology | Admitting: Oncology

## 2019-03-04 ENCOUNTER — Ambulatory Visit: Payer: Self-pay | Attending: Oncology

## 2019-03-04 VITALS — BP 143/89 | HR 89 | Temp 98.9°F | Ht 66.0 in | Wt 213.0 lb

## 2019-03-04 DIAGNOSIS — Z0000 Encounter for general adult medical examination without abnormal findings: Secondary | ICD-10-CM

## 2019-03-04 DIAGNOSIS — Z1231 Encounter for screening mammogram for malignant neoplasm of breast: Secondary | ICD-10-CM | POA: Insufficient documentation

## 2019-03-05 NOTE — Progress Notes (Signed)
  Subjective:     Patient ID: Debra Stephens, female   DOB: 1970/07/04, 48 y.o.   MRN: [id number]  HPI   Review of Systems     Objective:   Physical Exam Chest:     Breasts:        Right: No swelling, bleeding, inverted nipple, mass, nipple discharge, skin change or tenderness.        Left: No swelling, bleeding, inverted nipple, mass, nipple discharge, skin change or tenderness.        Assessment:     47 year old patient presents for [facility] clinic visit.  Patient screened, and meets [facility] eligibility.  Patient does not have insurance, Medicare or Medicaid. Instructed patient on breast self awareness using teach back method.  Clinical breast exam unremarkable. Patient concerned about 30 lb weight gain since Covid.  Has been eating comfort foods. Encouraged exercise such as walking..    Plan:     Sent for bilateral screening mammogram.

## 2019-03-12 NOTE — Progress Notes (Signed)
Letter mailed from [facility] to notify of normal mammogram results.  Patient to return in one year for annual screening.  Copy to [facility]. 

## 2019-03-21 IMAGING — CT CT ANGIO CHEST-ABD-PELV FOR DISSECTION W/ AND WO/W CM
2 of 7 series · 13 of 46 positions shown, 15 images · IV contrast (APPLIED)
Comparison: None.

CLINICAL DATA: Acute onset of left arm pain and numbness.
Left-sided chest and back pain.

EXAM:
CT ANGIOGRAPHY CHEST, ABDOMEN AND PELVIS
TECHNIQUE: Multidetector CT imaging through the chest, abdomen and pelvis was
performed using the standard protocol during bolus administration of
intravenous contrast. Multiplanar reconstructed images and MIPs were
obtained and reviewed to evaluate the vascular anatomy.
CONTRAST:  125mL [id number] IOPAMIDOL ([id number]) INJECTION 76%

[Series 6: axial arterial · axial · arterial · 0.69mm/px · z∈[-890,-341]mm · 10 of 211 slices shown, 12 images]
[im 14/211  soft-tissue]
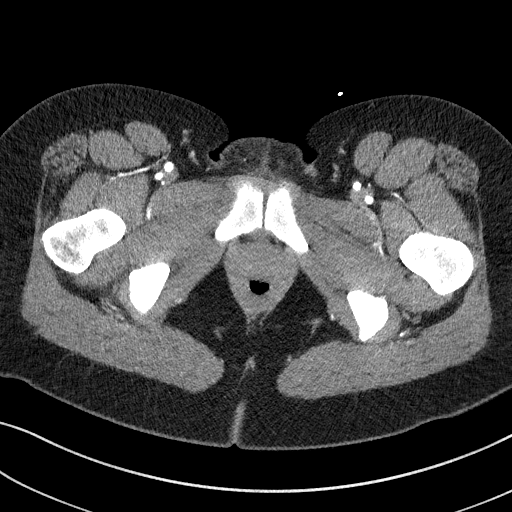
[im 14/211  bone]
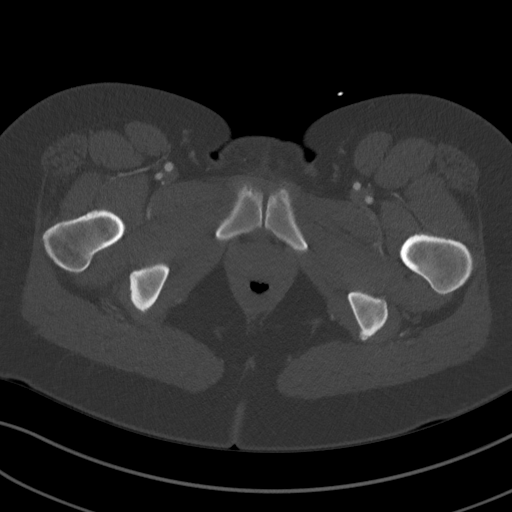
[im 40/211  soft-tissue]
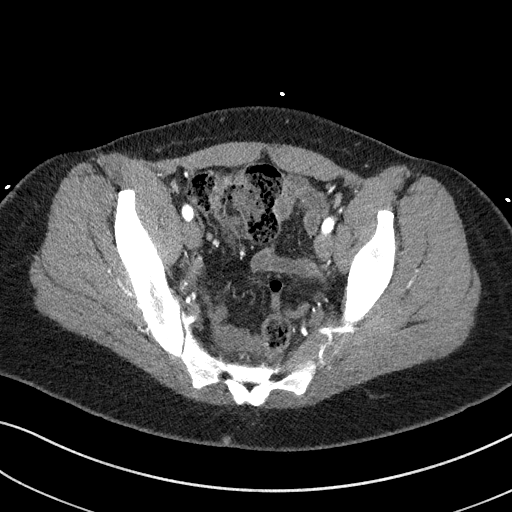
[im 53/211  soft-tissue]
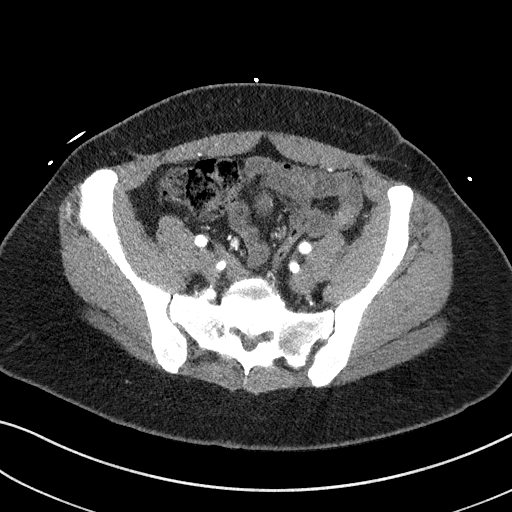
[im 79/211  soft-tissue]
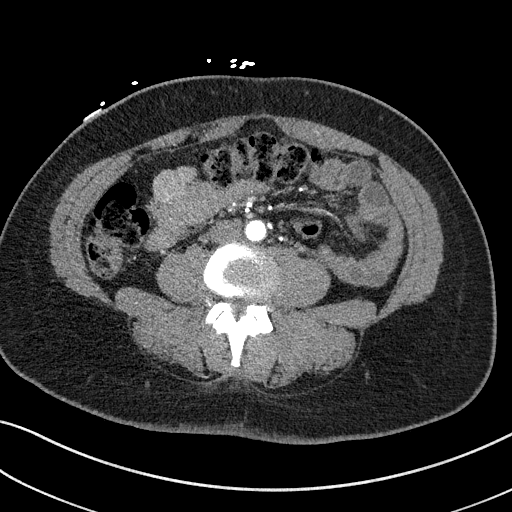
[im 92/211  soft-tissue]
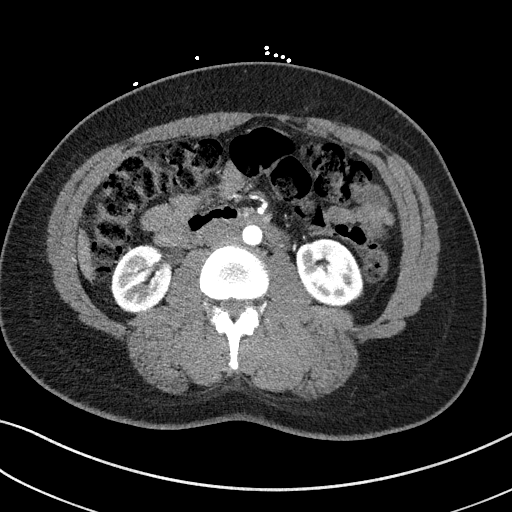
[im 119/211  soft-tissue]
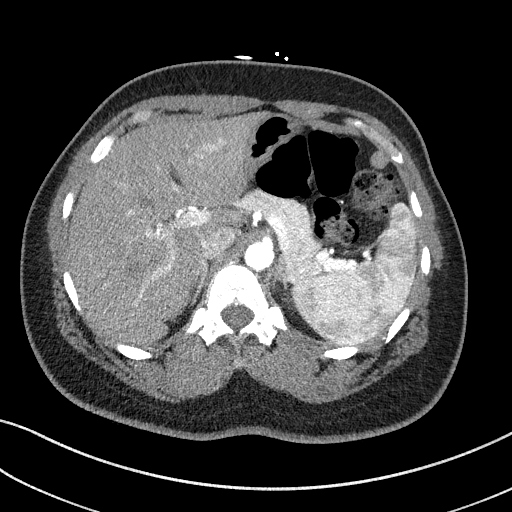
[im 132/211  soft-tissue]
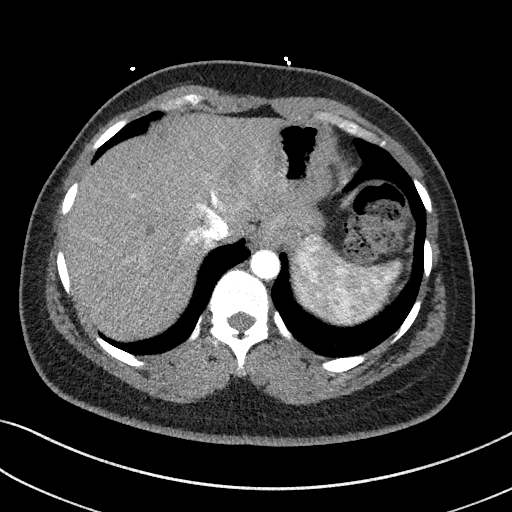
[im 158/211  soft-tissue]
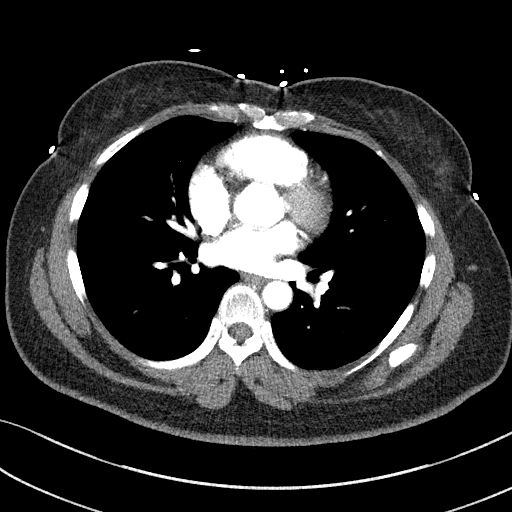
[im 171/211  soft-tissue]
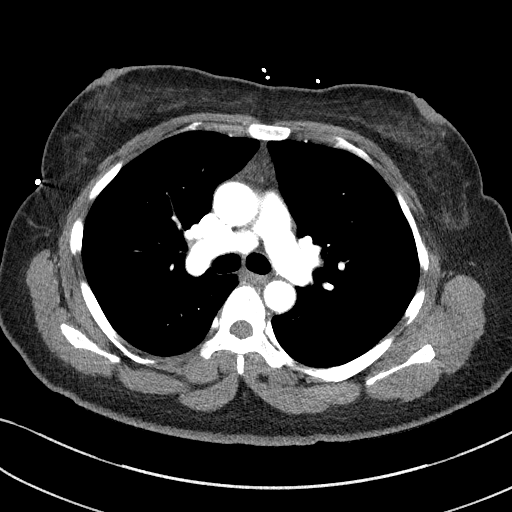
[im 171/211  bone]
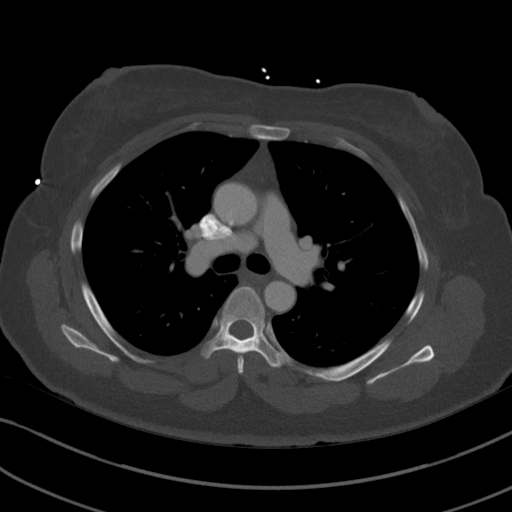
[im 197/211  soft-tissue]
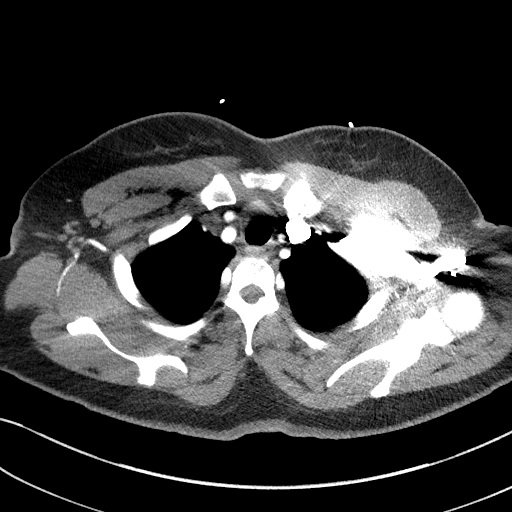

[Series 9: coronals · coronal · 0.67mm/px · 3 of 116 slices shown]
[im 29/116  soft-tissue]
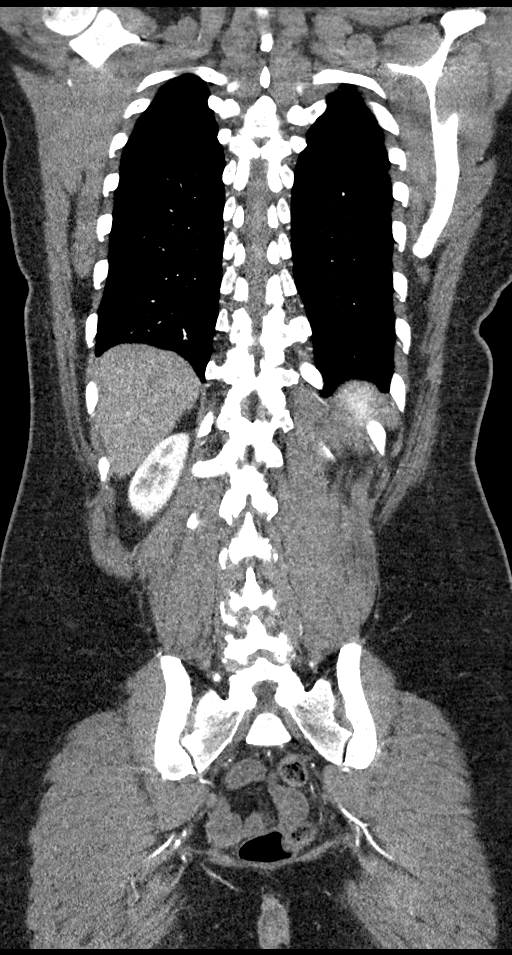
[im 58/116  soft-tissue]
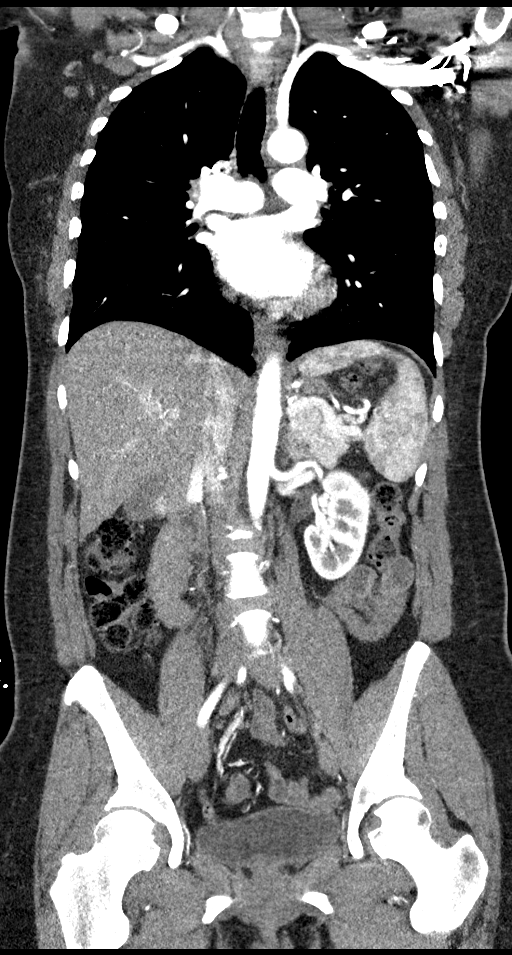
[im 87/116  soft-tissue]
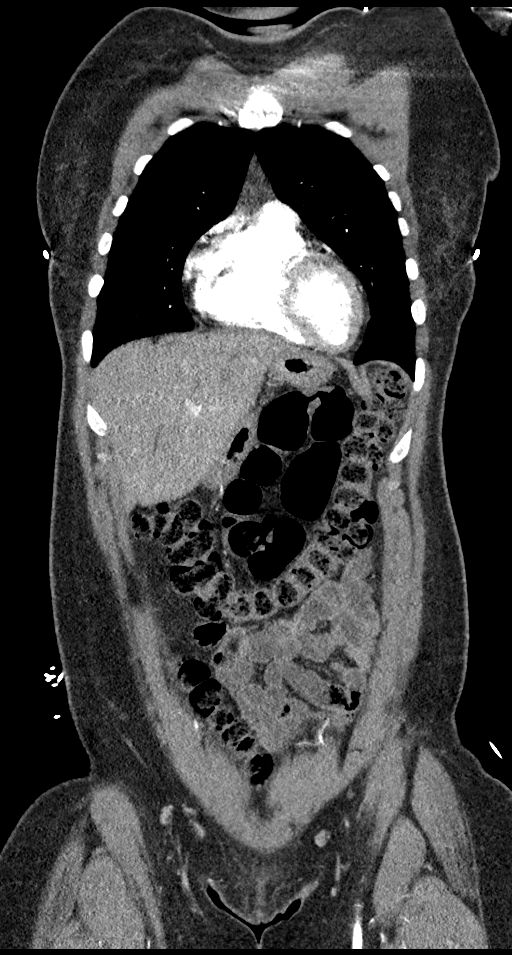

[13 of 46 positions shown; findings below may reference images not displayed]

FINDINGS: CTA CHEST FINDINGS

Cardiovascular: There is no evidence of aortic dissection. There is
no evidence of aneurysmal dilatation. No calcific atherosclerotic
disease is seen.

There is no evidence of significant pulmonary embolus. The heart is
normal in size. The great vessels are within normal limits.

Mediastinum/Nodes: The mediastinum is unremarkable in appearance. No
mediastinal lymphadenopathy is seen. No pericardial effusion is
identified. The thyroid gland is unremarkable. No axillary
lymphadenopathy is appreciated.

Lungs/Pleura: Minimal left basilar atelectasis is noted. No pleural
effusion or pneumothorax is seen. No masses are identified.

Musculoskeletal: No acute osseous abnormalities are identified.
There is osseous fusion of T3-T4. The visualized musculature is
unremarkable in appearance.

Review of the MIP images confirms the above findings.

CTA ABDOMEN AND PELVIS FINDINGS

VASCULAR

Aorta: There is no evidence of aortic dissection. There is no
evidence of aneurysmal dilatation. No calcific atherosclerotic
disease is seen.

Celiac: The celiac trunk appears fully patent.

SMA: The superior mesenteric artery is grossly unremarkable.

Renals: The renal arteries appear patent bilaterally.

IMA: The inferior mesenteric artery is unremarkable in appearance.

Inflow: The common, internal and external iliac arteries are
unremarkable in appearance.

Veins: The visualized venous structures are unremarkable in
appearance. Incomplete enhancement of the superior mesenteric vein
is thought to reflect the phase of contrast enhancement.

Review of the MIP images confirms the above findings.

NON-VASCULAR

Hepatobiliary: An 8 mm nonspecific hypodensity is noted at the right
hepatic lobe. The liver is otherwise unremarkable. The gallbladder
is unremarkable in appearance. The common bile duct remains normal
in caliber.

Pancreas: The pancreas is within normal limits.

Spleen: The spleen is unremarkable in appearance.

Adrenals/Urinary Tract: The adrenal glands are unremarkable in
appearance. The kidneys are within normal limits. There is no
evidence of hydronephrosis. No renal or ureteral stones are
identified. No perinephric stranding is seen.

Stomach/Bowel: The stomach is unremarkable in appearance. The small
bowel is within normal limits. The appendix is normal in caliber,
without evidence of appendicitis. The colon is unremarkable in
appearance.

Lymphatic: No retroperitoneal or pelvic sidewall lymphadenopathy is
seen.

Reproductive: The bladder is mildly distended and grossly
unremarkable. The patient is status post hysterectomy. No suspicious
adnexal masses are seen.

Other: No additional soft tissue abnormalities are seen.

Musculoskeletal: No acute osseous abnormalities are identified. The
visualized musculature is unremarkable in appearance.

Review of the MIP images confirms the above findings.
IMPRESSION: 1. No evidence of aortic dissection. No evidence of aneurysmal
dilatation.
2. No evidence of significant pulmonary embolus.
3. Minimal left basilar atelectasis.  Lungs otherwise clear.
4. 8 mm nonspecific hypodensity at the right hepatic lobe.

## 2019-08-30 ENCOUNTER — Ambulatory Visit: Payer: Self-pay | Attending: Internal Medicine

## 2019-08-30 ENCOUNTER — Other Ambulatory Visit: Payer: Self-pay

## 2019-08-30 DIAGNOSIS — Z23 Encounter for immunization: Secondary | ICD-10-CM

## 2019-08-30 NOTE — Progress Notes (Signed)
   Covid-19 Vaccination Clinic  Name:  Debra Stephens    MRN: [id number] DOB: May 27, 1971  08/30/2019  [name] was observed post Covid-19 immunization for 15 minutes without incident. She was provided with Vaccine Information Sheet and instruction to access the V-Safe system.   [name] was instructed to call 911 with any severe reactions post vaccine: Marland Kitchen Difficulty breathing  . Swelling of face and throat  . A fast heartbeat  . A bad rash all over body  . Dizziness and weakness   Immunizations Administered    Name Date Dose VIS Date Route   Pfizer COVID-19 Vaccine 08/30/2019  6:18 PM 0.3 mL 05/23/2019 Intramuscular   Manufacturer: [employer], [employer]   Lot: [id number]   NDC: [address]-6

## 2019-09-20 ENCOUNTER — Other Ambulatory Visit: Payer: Self-pay

## 2019-09-20 ENCOUNTER — Ambulatory Visit: Payer: Self-pay | Attending: Internal Medicine

## 2019-09-20 DIAGNOSIS — Z23 Encounter for immunization: Secondary | ICD-10-CM

## 2019-09-20 NOTE — Progress Notes (Signed)
   Covid-19 Vaccination Clinic  Name:  Debra Stephens    MRN: [id number] DOB: 1970/12/15  09/20/2019  Debra Stephens was observed post Covid-19 immunization for 15 minutes without incident. She was provided with Vaccine Information Sheet and instruction to access the V-Safe system.   Debra Stephens was instructed to call 911 with any severe reactions post vaccine: Marland Kitchen Difficulty breathing  . Swelling of face and throat  . A fast heartbeat  . A bad rash all over body  . Dizziness and weakness   Immunizations Administered    Name Date Dose VIS Date Route   Pfizer COVID-19 Vaccine 09/20/2019  4:31 PM 0.3 mL 05/23/2019 Intramuscular   Manufacturer: [employer], [employer]   Lot: [id number]   NDC: [address]-2

## 2020-03-09 ENCOUNTER — Encounter: Payer: Self-pay | Admitting: Obstetrics & Gynecology

## 2020-03-09 ENCOUNTER — Other Ambulatory Visit: Payer: Self-pay

## 2020-03-09 ENCOUNTER — Ambulatory Visit (INDEPENDENT_AMBULATORY_CARE_PROVIDER_SITE_OTHER): Payer: No Typology Code available for payment source | Admitting: Obstetrics & Gynecology

## 2020-03-09 VITALS — BP 150/90 | Ht 66.0 in | Wt 204.0 lb

## 2020-03-09 DIAGNOSIS — Z1211 Encounter for screening for malignant neoplasm of colon: Secondary | ICD-10-CM

## 2020-03-09 DIAGNOSIS — Z01419 Encounter for gynecological examination (general) (routine) without abnormal findings: Secondary | ICD-10-CM

## 2020-03-09 DIAGNOSIS — Z1321 Encounter for screening for nutritional disorder: Secondary | ICD-10-CM

## 2020-03-09 DIAGNOSIS — Z13220 Encounter for screening for lipoid disorders: Secondary | ICD-10-CM

## 2020-03-09 DIAGNOSIS — R5382 Chronic fatigue, unspecified: Secondary | ICD-10-CM | POA: Diagnosis not present

## 2020-03-09 DIAGNOSIS — Z23 Encounter for immunization: Secondary | ICD-10-CM

## 2020-03-09 DIAGNOSIS — Z1231 Encounter for screening mammogram for malignant neoplasm of breast: Secondary | ICD-10-CM | POA: Diagnosis not present

## 2020-03-09 DIAGNOSIS — Z131 Encounter for screening for diabetes mellitus: Secondary | ICD-10-CM

## 2020-03-09 DIAGNOSIS — R600 Localized edema: Secondary | ICD-10-CM

## 2020-03-09 MED ORDER — FLUCONAZOLE 150 MG PO TABS: 150.0000 mg | ORAL_TABLET | Freq: Once | ORAL | 3 refills | Status: AC

## 2020-03-09 NOTE — Patient Instructions (Signed)
PAP every 5 years Mammogram every year    Call [phone] to schedule at [facility] Colonoscopy every 10 years Labs today  Thank you for choosing [facility]. As part of our ongoing efforts to improve patient experience, we would appreciate your feedback. Please fill out the short survey that you will receive by mail or MyChart. Your opinion is important to us! - Dr. Gailyn Crook  

## 2020-03-09 NOTE — Progress Notes (Signed)
HPI:      [name] is a 49 y.o.who LMP was in the past, she presents today for her annual examination.  Prior TVH.  The patient has no complaints today, except for chronic fatigue, urinary frequency without urgency, nocturia, or dysuria, and weight gain. The patient is sexually active. Herlast pap: approximate date 2019 and was normal and last mammogram: approximate date 2019 and was normal.  The patient does perform self breast exams.  There is notable family history of breast or ovarian cancer in her family. BRCA NEG. The patient is not taking hormone replacement therapy. Patient denies post-menopausal vaginal bleeding.   The patient has regular exercise: yes. The patient denies current symptoms of depression.    GYN Hx: Last Colonoscopy:2 years ago. Normal.  Last DEXA: never ago.    PMHx: Past Medical History:  Diagnosis Date   Abdominal tenderness, right upper quadrant    Abnormal Pap smear of cervix    Allergic rhinitis, cause unspecified    Anemia    Chronic headaches    Cough    Lumbago    Other and unspecified hyperlipidemia    Swelling    Thoracic or lumbosacral neuritis or radiculitis, unspecified    Unspecified symptom associated with female genital organs    Unspecified vitamin D deficiency    Past Surgical History:  Procedure Laterality Date   BREAST LUMPECTOMY Left    COLONOSCOPY WITH PROPOFOL N/A 11/02/2017   Procedure: COLONOSCOPY WITH PROPOFOL;  Surgeon: Virgel Manifold, MD;  Location: [facility] ENDOSCOPY;  Service: Endoscopy;  Laterality: N/A;   TUBAL LIGATION     VAGINAL HYSTERECTOMY     Family History  Problem Relation Age of Onset   Stroke Mother    Hypertension Mother    Heart murmur Mother    Hypertension Father    Breast cancer Maternal Aunt    Lupus Sister    Lupus Daughter    Liver cancer Maternal Grandfather    Cirrhosis Maternal Grandfather    Hypertension Paternal Grandmother    Sickle cell anemia Sister     Thyroid cancer Sister    Seizures Sister    Hashimoto's thyroiditis Sister    Fibromyalgia Sister    Social History   Tobacco Use   Smoking status: Never Smoker   Smokeless tobacco: Never Used  Scientific laboratory technician Use: Never used  Substance Use Topics   Alcohol use: No   Drug use: No    Current Outpatient Medications:    acetaminophen (TYLENOL) 325 MG tablet, Take 650 mg by mouth as needed., Disp: , Rfl:    azelastine (ASTELIN) 0.1 % nasal spray, Place into the nose., Disp: , Rfl:    dextromethorphan-guaiFENesin (MUCINEX DM) 30-600 MG 12hr tablet, Take 1 tablet by mouth 2 (two) times daily., Disp: 20 tablet, Rfl: 0   fluticasone (FLONASE) 50 MCG/ACT nasal spray, Place 2 sprays into both nostrils daily., Disp: 16 g, Rfl: 6   Multiple Vitamin (MULTIVITAMIN WITH MINERALS) TABS tablet, Take 1 tablet by mouth daily., Disp: , Rfl:    Vitamin D, Ergocalciferol, (DRISDOL) 50000 units CAPS capsule, Take 1 capsule (50,000 Units total) by mouth every 7 (seven) days., Disp: 12 capsule, Rfl: 0   fluconazole (DIFLUCAN) 150 MG tablet, Take 1 tablet (150 mg total) by mouth once for 1 dose. Can take additional dose three days later if symptoms persist, Disp: 1 tablet, Rfl: 3 Allergies: Erythromycin, Ibuprofen, and Vibramycin [doxycycline calcium]  Review of Systems  Constitutional:  Positive for malaise/fatigue. Negative for chills and fever.  HENT: Negative for congestion, sinus pain and sore throat.   Eyes: Negative for blurred vision and pain.  Respiratory: Negative for cough and wheezing.   Cardiovascular: Negative for chest pain and leg swelling.  Gastrointestinal: Negative for abdominal pain, constipation, diarrhea, heartburn, nausea and vomiting.  Genitourinary: Positive for frequency. Negative for dysuria, hematuria and urgency.  Musculoskeletal: Negative for back pain, joint pain, myalgias and neck pain.  Skin: Negative for itching and rash.  Neurological: Negative for  dizziness, tremors and weakness.  Endo/Heme/Allergies: Does not bruise/bleed easily.  Psychiatric/Behavioral: Negative for depression. The patient is not nervous/anxious and does not have insomnia.     Objective: BP (!) 150/90    Ht 5' 6"  (1.676 m)    Wt 204 lb (92.5 kg)    BMI 32.93 kg/m   Filed Weights   03/09/20 0811  Weight: 204 lb (92.5 kg)   Body mass index is 32.93 kg/m. Physical Exam Constitutional:      General: She is not in acute distress.    Appearance: She is well-developed.  Genitourinary:     Pelvic exam was performed with patient supine.     Vulva, urethra, bladder, vagina and rectum normal.     No lesions in the vagina.     No vaginal bleeding.     Cervix is absent.     Uterus is absent.     No right or left adnexal mass present.     Right adnexa not tender.     Left adnexa not tender.     Genitourinary Comments: Vaginal cuff well healed  HENT:     Head: Normocephalic and atraumatic. No laceration.     Right Ear: Hearing normal.     Left Ear: Hearing normal.     Mouth/Throat:     Pharynx: Uvula midline.  Eyes:     Pupils: Pupils are equal, round, and reactive to light.  Neck:     Thyroid: No thyromegaly.  Cardiovascular:     Rate and Rhythm: Normal rate and regular rhythm.     Heart sounds: No murmur heard.  No friction rub. No gallop.   Pulmonary:     Effort: Pulmonary effort is normal. No respiratory distress.     Breath sounds: Normal breath sounds. No wheezing.  Chest:     Breasts:        Right: No mass, skin change or tenderness.        Left: No mass, skin change or tenderness.  Abdominal:     General: Bowel sounds are normal. There is no distension.     Palpations: Abdomen is soft.     Tenderness: There is no abdominal tenderness. There is no rebound.  Musculoskeletal:        General: Normal range of motion.     Cervical back: Normal range of motion and neck supple.  Neurological:     Mental Status: She is alert and oriented to person,  place, and time.     Cranial Nerves: No cranial nerve deficit.  Skin:    General: Skin is warm and dry.  Psychiatric:        Judgment: Judgment normal.  Vitals reviewed.    UA NEG x trace heme  Assessment: Annual Exam 1. Women's annual routine gynecological examination   2. Encounter for screening mammogram for malignant neoplasm of breast   3. Screen for colon cancer   4. Chronic fatigue   5. Localized  edema   6. Screening for cholesterol level   7. Screening for diabetes mellitus   8. Encounter for vitamin deficiency screening     Plan:            1.  Vaginal Screening-  Pap smear schedule reviewed with patient (every 5 years)  2. Breast screening- Exam annually and mammogram scheduled  3. Colonoscopy every 10 years, Hemoccult testing after age 81  4. Labs Ordered today  5. Counseling for hormonal therapy: none              6. FRAX - FRAX score for assessing the 10 year probability for fracture calculated and discussed today.  Based on age and score today, DEXA is not currently scheduled.   7. Obesity and weight gain  Meds discussed  Consideration for high BP discussed as well   No Phentermine at this time until can see if she has cHTN or is it related to OTC meds (Sudfed) or other causes  Alli discussed   8. Fatigue.  Check labs.  Monitor BP.  See PCP as well for this.   9. LE Edema.  Intermittent.  May be BP related, kidney related.  Labs.  See PCP for this.     F/U  Return in about 1 year (around 03/09/2021) for Annual.  Barnett Applebaum, MD, [facility], [facility] 03/09/2020  8:41 AM

## 2020-03-10 ENCOUNTER — Other Ambulatory Visit (HOSPITAL_COMMUNITY): Payer: Self-pay | Admitting: Obstetrics & Gynecology

## 2020-03-10 ENCOUNTER — Encounter: Payer: Self-pay | Admitting: Obstetrics and Gynecology

## 2020-03-10 ENCOUNTER — Other Ambulatory Visit: Payer: Self-pay | Admitting: Obstetrics & Gynecology

## 2020-03-10 DIAGNOSIS — R5382 Chronic fatigue, unspecified: Secondary | ICD-10-CM

## 2020-03-10 LAB — CMP AND LIVER
ALT: 17 IU/L (ref 0–32)
AST: 24 IU/L (ref 0–40)
Albumin: 3.9 g/dL (ref 3.8–4.8)
Alkaline Phosphatase: 63 IU/L (ref 44–121)
BUN: 8 mg/dL (ref 6–24)
Bilirubin Total: 0.8 mg/dL (ref 0.0–1.2)
Bilirubin, Direct: 0.17 mg/dL (ref 0.00–0.40)
CO2: 25 mmol/L (ref 20–29)
Calcium: 8.9 mg/dL (ref 8.7–10.2)
Chloride: 102 mmol/L (ref 96–106)
Creatinine, Ser: 0.89 mg/dL (ref 0.57–1.00)
GFR calc Af Amer: 88 mL/min/{1.73_m2} (ref >59)
GFR calc non Af Amer: 76 mL/min/{1.73_m2} (ref >59)
Glucose, Plasma: 81 mg/dL (ref 65–99)
Potassium: 3.9 mmol/L (ref 3.5–5.2)
Sodium: 138 mmol/L (ref 134–144)
Total Protein: 6.6 g/dL (ref 6.0–8.5)

## 2020-03-10 LAB — CBC WITH DIFFERENTIAL
Basophils Absolute: 0 10*3/uL (ref 0.0–0.2)
Basos: 1 %
EOS (ABSOLUTE): 0.1 10*3/uL (ref 0.0–0.4)
Eos: 2 %
Hematocrit: 39.4 % (ref 34.0–46.6)
Hemoglobin: 12.7 g/dL (ref 11.1–15.9)
Immature Grans (Abs): 0 10*3/uL (ref 0.0–0.1)
Immature Granulocytes: 0 %
Lymphocytes Absolute: 1.2 10*3/uL (ref 0.7–3.1)
Lymphs: 37 %
MCH: 23.8 pg — ABNORMAL LOW (ref 26.6–33.0)
MCHC: 32.2 g/dL (ref 31.5–35.7)
MCV: 74 fL — ABNORMAL LOW (ref 79–97)
Monocytes Absolute: 0.6 10*3/uL (ref 0.1–0.9)
Monocytes: 18 %
Neutrophils Absolute: 1.4 10*3/uL (ref 1.4–7.0)
Neutrophils: 42 %
RBC: 5.34 x10E6/uL — ABNORMAL HIGH (ref 3.77–5.28)
RDW: 13.8 % (ref 11.7–15.4)
WBC: 3.3 10*3/uL — ABNORMAL LOW (ref 3.4–10.8)

## 2020-03-10 LAB — LIPID PANEL
Chol/HDL Ratio: 3.6 ratio (ref 0.0–4.4)
Cholesterol, Total: 190 mg/dL (ref 100–199)
HDL: 53 mg/dL (ref >39)
LDL Chol Calc (NIH): 122 mg/dL — ABNORMAL HIGH (ref 0–99)
Triglycerides: 81 mg/dL (ref 0–149)
VLDL Cholesterol Cal: 15 mg/dL (ref 5–40)

## 2020-03-10 LAB — TSH: TSH: 1.05 u[IU]/mL (ref 0.450–4.500)

## 2020-03-10 LAB — VITAMIN D 25 HYDROXY (VIT D DEFICIENCY, FRACTURES): Vit D, 25-Hydroxy: 15.8 ng/mL — ABNORMAL LOW (ref 30.0–100.0)

## 2020-03-10 MED ORDER — VITAMIN D (ERGOCALCIFEROL) 1.25 MG (50000 UNIT) PO CAPS
50000.0000 [IU] | ORAL_CAPSULE | ORAL | 2 refills | Status: AC
Start: 2020-03-10 — End: ?

## 2020-03-12 NOTE — Progress Notes (Signed)
New referral from Dr. Tiburcio Pea for fatigue, patient report fatigue and restlessness. She denies pain at this time. Would like to discuss alternatives for low iron. Currently taking vitamin d but states she doesn't feel that it's working

## 2020-03-12 NOTE — Progress Notes (Signed)
Patient ID: Debra Stephens, female    DOB: April 14, 1971, 49 y.o.   MRN: [id number]  PCP: Debra Berry, PA-C  Chief Complaint  Patient presents with  . Hypertension    Subjective:   Debra Stephens is a 49 y.o. female, presents to clinic with CC of the following:  HPI  BP readings high at other doctors office, so she is here for recheck She's had 150/90, 154/80's, 130's some readings at work She has been having allergy and sinus pressure, pressure to eyes, forehead, some increased stress and weight gain over the past 6-12 months.  She notes some chronic "fluid retention", has HA's associated with sinuses, denies CP, SOB, palpitations, near syncope, orthopnea.  She has hx of chronic rhinosinusitis, in June had multiple visits to outpt walkin clinic Debra Stephens was treated with levaquin, prednisone tessalon, went back in 2 weeks and was treated with augmentin and she continues to have sinus issues and right sided check and jaw pain and swelling that didn't resolve completely  Hx of HTN not ever on medicines - BP ranges 120/70s in chart more than 4 years ago, in the past 2-3 years BP ranged 140-150's  Hx of HLD not on meds The 10-year ASCVD risk score Debra George DC Jr., et al., 2013) is: 2.7%   Values used to calculate the score:     Age: 19 years     Sex: Female     Is Non-Hispanic African American: Yes     Diabetic: No     Tobacco smoker: No     Systolic Blood Pressure: 142 mmHg     Is BP treated: No     HDL Cholesterol: 53 mg/dL     Total Cholesterol: 190 mg/dL  She is going to see Hematology/oncology in the next hour for abnormal labs found by GYN, Dr. Tiburcio Pea - going to see Dr. Orlie Dakin - pt reports hx of cancer in family and they were concerned with lab results  Pt has no anemia, microcytosis, mildly elevated RBC, last iron panel was a few years ago, unremarkable. Pt notes fatigue, has not menses and denies any melena or hematochezia Low Vit D with Rx to replace per  OBGYN  Other wise labs done last week show normal renal and liver function, normal electrolytes, TSH normal  She notes poor sleep, snoring, worse sleep with laying flat - always tired and easy to fall asleep Used to see ENT before and had allergy shots - she cut out all OTC allergy and sinus meds 2 weeks ago try see if that would help her BP   Patient Active Problem List   Diagnosis Date Noted  . Vitamin B12 deficiency 01/29/2018  . Microcytosis 01/29/2018  . Recurrent sinusitis 12/14/2017  . Dysmetabolic syndrome 11/08/2017  . Special screening for malignant neoplasms, colon   . Chronic idiopathic constipation 08/13/2017  . Obesity (BMI 30-39.9) 07/27/2017  . 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 Vitamin D deficiency 11/22/2016  . ANA positive 05/24/2016  . H/O urticaria 09/08/2015  . Angio-edema 09/08/2015  . Atypical chest pain 06/24/2013      Current Outpatient Medications:  .  acetaminophen (TYLENOL) 325 MG tablet, Take 650 mg by mouth as needed., Disp: , Rfl:  .  azelastine (ASTELIN) 0.1 % nasal spray, Place into the nose., Disp: , Rfl:  .  Multiple Vitamin (MULTIVITAMIN WITH MINERALS) TABS tablet, Take 1 tablet by mouth daily., Disp: , Rfl:  .  Vitamin D, Ergocalciferol, (DRISDOL) 1.25 MG (  50000 UNIT) CAPS capsule, Take 1 capsule (50,000 Units total) by mouth every 7 (seven) days., Disp: 4 capsule, Rfl: 2 .  dextromethorphan-guaiFENesin (MUCINEX DM) 30-600 MG 12hr tablet, Take 1 tablet by mouth 2 (two) times daily. (Patient not taking: Reported on 03/15/2020), Disp: 20 tablet, Rfl: 0   Allergies  Allergen Reactions  . Erythromycin Other (See Comments)  . Ibuprofen Hives  . Vibramycin [Doxycycline Calcium] Hives     Social History   Tobacco Use  . Smoking status: Never Smoker  . Smokeless tobacco: Never Used  Vaping Use  . Vaping Use: Never used  Substance Use Topics  . Alcohol use: No  . Drug use: No      Chart Review Today: I personally reviewed  active problem list, medication list, allergies, family history, social history, health maintenance, notes from last encounter, lab results, imaging with the patient/caregiver today.   Review of Systems  Constitutional: Negative.   HENT: Positive for congestion, postnasal drip, rhinorrhea, sinus pressure and sinus pain. Negative for ear discharge, ear pain, sore throat, trouble swallowing and voice change.   Eyes: Negative.   Respiratory: Negative.  Negative for apnea, cough, choking, chest tightness, shortness of breath and wheezing.   Cardiovascular: Positive for leg swelling. Negative for chest pain and palpitations.  Gastrointestinal: Negative.   Endocrine: Negative.   Genitourinary: Negative.   Musculoskeletal: Negative.   Skin: Negative.   Allergic/Immunologic: Negative.   Neurological: Negative.   Hematological: Negative.   Psychiatric/Behavioral: Negative.   All other systems reviewed and are negative.      Objective:   Vitals:   03/15/20 0809  BP: (!) 142/78  Pulse: 87  Resp: 16  Temp: 98.1 F (36.7 C)  TempSrc: Oral  SpO2: 99%  Weight: 205 lb 11.2 oz (93.3 kg)  Height: 5\' 6"  (1.676 m)    Body mass index is 33.2 kg/m.  Physical Exam Vitals and nursing note reviewed.  Constitutional:      General: She is not in acute distress.    Appearance: Normal appearance. She is well-developed. She is not ill-appearing, toxic-appearing or diaphoretic.     Interventions: Face mask in place.  HENT:     Head: Normocephalic and atraumatic.     Right Ear: Tympanic membrane, ear canal and external ear normal. There is no impacted cerumen.     Left Ear: Tympanic membrane, ear canal and external ear normal. There is no impacted cerumen.     Ears:     Comments: Mild right TM dullness    Nose: Mucosal edema and congestion present.     Right Nostril: No occlusion.     Left Nostril: No occlusion.     Right Turbinates: Swollen.     Left Turbinates: Swollen.     Right Sinus: No  maxillary sinus tenderness or frontal sinus tenderness.     Left Sinus: No maxillary sinus tenderness or frontal sinus tenderness.     Mouth/Throat:     Lips: Pink.     Mouth: Mucous membranes are dry.     Dentition: No dental abscesses.     Pharynx: Oropharynx is clear. Uvula midline. No pharyngeal swelling or posterior oropharyngeal erythema.     Comments: Right cheek to right lower jaw mildly ttp and mildly swollen No erythema Jaw and lower face/cheeks slightly asymmetrical   Eyes:     General: Lids are normal. No scleral icterus.       Right eye: No discharge.  Left eye: No discharge.     Conjunctiva/sclera: Conjunctivae normal.  Neck:     Trachea: Phonation normal. No tracheal deviation.  Cardiovascular:     Rate and Rhythm: Normal rate and regular rhythm.     Chest Wall: PMI is not displaced.     Pulses: Normal pulses.          Radial pulses are 2+ on the right side and 2+ on the left side.       Posterior tibial pulses are 2+ on the right side and 2+ on the left side.     Heart sounds: Normal heart sounds. No murmur heard.  No friction rub. No gallop.   Pulmonary:     Effort: Pulmonary effort is normal. No respiratory distress.     Breath sounds: Normal breath sounds. No stridor. No wheezing, rhonchi or rales.  Chest:     Chest wall: No tenderness.  Abdominal:     General: Bowel sounds are normal. There is no distension.     Palpations: Abdomen is soft.     Tenderness: There is no abdominal tenderness. There is no guarding or rebound.  Musculoskeletal:        General: No deformity. Normal range of motion.     Cervical back: Normal range of motion and neck supple.     Right lower leg: No edema.     Left lower leg: No edema.  Lymphadenopathy:     Head:     Right side of head: No submental, submandibular or tonsillar adenopathy.     Left side of head: No submental, submandibular or tonsillar adenopathy.     Cervical: No cervical adenopathy.  Skin:    General:  Skin is warm and dry.     Capillary Refill: Capillary refill takes less than 2 seconds.     Coloration: Skin is not jaundiced or pale.     Findings: No rash.  Neurological:     Mental Status: She is alert and oriented to person, place, and time.     Motor: No abnormal muscle tone.     Gait: Gait normal.  Psychiatric:        Attention and Perception: Attention normal.        Mood and Affect: Mood is anxious. Mood is not depressed.        Speech: Speech normal.        Behavior: Behavior normal. Behavior is cooperative.      Results for orders placed or performed in visit on 03/09/20  CBC With Differential  Result Value Ref Range   WBC 3.3 (L) 3.4 - 10.8 x10E3/uL   RBC 5.34 (H) 3.77 - 5.28 x10E6/uL   Hemoglobin 12.7 11.1 - 15.9 g/dL   Hematocrit 16.1 09.6 - 46.6 %   MCV 74 (L) 79 - 97 fL   MCH 23.8 (L) 26.6 - 33.0 pg   MCHC 32.2 31 - 35 g/dL   RDW 04.5 40.9 - 81.1 %   Neutrophils 42 Not Estab. %   Lymphs 37 Not Estab. %   Monocytes 18 Not Estab. %   Eos 2 Not Estab. %   Basos 1 Not Estab. %   Neutrophils Absolute 1.4 1 - 7 x10E3/uL   Lymphocytes Absolute 1.2 0 - 3 x10E3/uL   Monocytes Absolute 0.6 0 - 0 x10E3/uL   EOS (ABSOLUTE) 0.1 0.0 - 0.4 x10E3/uL   Basophils Absolute 0.0 0 - 0 x10E3/uL   Immature Granulocytes 0 Not Estab. %   Immature  Grans (Abs) 0.0 0.0 - 0.1 x10E3/uL  CMP and Liver  Result Value Ref Range   Glucose, Plasma 81 65 - 99 mg/dL   BUN 8 6 - 24 mg/dL   Creatinine, Ser 6.26 0.57 - 1.00 mg/dL   GFR calc non Af Amer 76 >59 mL/min/1.73   GFR calc Af Amer 88 >59 mL/min/1.73   Sodium 138 134 - 144 mmol/L   Potassium 3.9 3.5 - 5.2 mmol/L   Chloride 102 96 - 106 mmol/L   CO2 25 20 - 29 mmol/L   Calcium 8.9 8.7 - 10.2 mg/dL   Total Protein 6.6 6.0 - 8.5 g/dL   Albumin 3.9 3.8 - 4.8 g/dL   Bilirubin Total 0.8 0.0 - 1.2 mg/dL   Bilirubin, Direct 9.48 0.00 - 0.40 mg/dL   Alkaline Phosphatase 63 44 - 121 IU/L   AST 24 0 - 40 IU/L   ALT 17 0 - 32 IU/L  Lipid  panel  Result Value Ref Range   Cholesterol, Total 190 100 - 199 mg/dL   Triglycerides 81 0 - 149 mg/dL   HDL 53 >54 mg/dL   VLDL Cholesterol Cal 15 5 - 40 mg/dL   LDL Chol Calc (NIH) 627 (H) 0 - 99 mg/dL   Chol/HDL Ratio 3.6 0.0 - 4.4 ratio  TSH  Result Value Ref Range   TSH 1.050 0.450 - 4.500 uIU/mL  VITAMIN D 25 Hydroxy (Vit-D Deficiency, Fractures)  Result Value Ref Range   Vit D, 25-Hydroxy 15.8 (L) 30.0 - 100.0 ng/mL       Assessment & Plan:   1. Hypertension, unspecified type BP's elevated over the past couple years She does have increased weight (up 30 lbs per pt report), significant stress with new job, mother recently had massive stroke, and she has her own health issues causing stress and anxiety, additionally months of recent sinus infections and HA's, plus poor sleep due to snoring (OSA?) Will work on diet and lifestyle efforts, perticularly low salt efforts and increasing healthy foods She will monitor BP - info and handouts given about how to propertly take BP - BP log given She did not want to start HTN meds but wanted a diuretic She had no concerning cardiac sx today - hydrochlorothiazide (HYDRODIURIL) 12.5 MG tablet; Take 1 tablet (12.5 mg total) by mouth in the morning.  Dispense: 90 tablet; Refill: 1 I think she would do well with losartan and if per her preference HCTZ - but she is hesitant to start meds.  With reviewing her VS trends I think she may need some HTN management going forward.  Will work on diet/lifestyle, monitor BP readings, start HCTZ Will add losartan if BP >130/80  Close f/up   2. Snoring - Ambulatory referral to ENT  3. Chronic rhinosinusitis Given steroid nasal spray sample - 2 sprays each nostril daily and restart daily antihistamine like claritin, zyrtec or allegra once daily at bedtime - Ambulatory referral to ENT  4. Excessive daytime sleepiness Tx chronic rhinosinusitis, consider sleep study  5. Right facial swelling Right  cheek to right lower jaw, mildly swollen and tender to palpation - Ambulatory referral to ENT  6. Hyperlipidemia, unspecified hyperlipidemia type LDL elevated, ASCVD risk score low Advised to work on diet and lifestyle    Hx of chronic rhinosinusitis, seasonal allergies (on allergy shots before), recently had months of worsening sinus sx, multiple rounds of abx, has months of right lower cheek swelling and tenderness which she notes first occurred with sinus  infection in June and had remained swollen and tender - some mild swelling and tenderness on exam, no suspected abscess or acute bacterial sinusitis, she also has ETD b/l on and off, with intermittent vertigo sx Snoring and excessive sleepiness as well - I have advised her to restart intranasal steroid spray daily and 2nd gen antihistamine daily, I am considering a sleep study.  She has elevated BP so will be avoiding decongestants.   Would appreciate help with her chronic rhinitis, cheek/facial swelling, and any recommendations about doing sleep study - or waiting till rhinitis is better controlled.   Debra Berry, PA-C 03/15/20 8:25 AM

## 2020-03-15 ENCOUNTER — Encounter: Payer: Self-pay | Admitting: Family Medicine

## 2020-03-15 ENCOUNTER — Other Ambulatory Visit: Payer: Self-pay

## 2020-03-15 ENCOUNTER — Encounter: Payer: Self-pay | Admitting: Oncology

## 2020-03-15 ENCOUNTER — Inpatient Hospital Stay: Payer: No Typology Code available for payment source

## 2020-03-15 ENCOUNTER — Inpatient Hospital Stay: Payer: No Typology Code available for payment source | Attending: Oncology | Admitting: Oncology

## 2020-03-15 ENCOUNTER — Ambulatory Visit (INDEPENDENT_AMBULATORY_CARE_PROVIDER_SITE_OTHER): Payer: No Typology Code available for payment source | Admitting: Family Medicine

## 2020-03-15 VITALS — BP 144/86 | HR 64 | Temp 98.2°F | Resp 20 | Ht 66.0 in | Wt 204.9 lb

## 2020-03-15 VITALS — BP 142/78 | HR 87 | Temp 98.1°F | Resp 16 | Ht 66.0 in | Wt 205.7 lb

## 2020-03-15 DIAGNOSIS — Z801 Family history of malignant neoplasm of trachea, bronchus and lung: Secondary | ICD-10-CM | POA: Diagnosis not present

## 2020-03-15 DIAGNOSIS — R220 Localized swelling, mass and lump, head: Secondary | ICD-10-CM

## 2020-03-15 DIAGNOSIS — Z800 Family history of malignant neoplasm of digestive organs: Secondary | ICD-10-CM | POA: Insufficient documentation

## 2020-03-15 DIAGNOSIS — Z8379 Family history of other diseases of the digestive system: Secondary | ICD-10-CM | POA: Diagnosis not present

## 2020-03-15 DIAGNOSIS — D649 Anemia, unspecified: Secondary | ICD-10-CM

## 2020-03-15 DIAGNOSIS — Z823 Family history of stroke: Secondary | ICD-10-CM | POA: Diagnosis not present

## 2020-03-15 DIAGNOSIS — Z803 Family history of malignant neoplasm of breast: Secondary | ICD-10-CM | POA: Insufficient documentation

## 2020-03-15 DIAGNOSIS — Z8349 Family history of other endocrine, nutritional and metabolic diseases: Secondary | ICD-10-CM | POA: Diagnosis not present

## 2020-03-15 DIAGNOSIS — G4719 Other hypersomnia: Secondary | ICD-10-CM

## 2020-03-15 DIAGNOSIS — Z8249 Family history of ischemic heart disease and other diseases of the circulatory system: Secondary | ICD-10-CM | POA: Diagnosis not present

## 2020-03-15 DIAGNOSIS — R531 Weakness: Secondary | ICD-10-CM | POA: Diagnosis not present

## 2020-03-15 DIAGNOSIS — D72819 Decreased white blood cell count, unspecified: Secondary | ICD-10-CM

## 2020-03-15 DIAGNOSIS — J310 Chronic rhinitis: Secondary | ICD-10-CM | POA: Diagnosis not present

## 2020-03-15 DIAGNOSIS — I10 Essential (primary) hypertension: Secondary | ICD-10-CM | POA: Diagnosis not present

## 2020-03-15 DIAGNOSIS — Z8041 Family history of malignant neoplasm of ovary: Secondary | ICD-10-CM | POA: Insufficient documentation

## 2020-03-15 DIAGNOSIS — R718 Other abnormality of red blood cells: Secondary | ICD-10-CM | POA: Insufficient documentation

## 2020-03-15 DIAGNOSIS — E785 Hyperlipidemia, unspecified: Secondary | ICD-10-CM

## 2020-03-15 DIAGNOSIS — Z8269 Family history of other diseases of the musculoskeletal system and connective tissue: Secondary | ICD-10-CM | POA: Insufficient documentation

## 2020-03-15 DIAGNOSIS — R0683 Snoring: Secondary | ICD-10-CM

## 2020-03-15 DIAGNOSIS — Z79899 Other long term (current) drug therapy: Secondary | ICD-10-CM | POA: Insufficient documentation

## 2020-03-15 DIAGNOSIS — Z832 Family history of diseases of the blood and blood-forming organs and certain disorders involving the immune mechanism: Secondary | ICD-10-CM | POA: Insufficient documentation

## 2020-03-15 DIAGNOSIS — J329 Chronic sinusitis, unspecified: Secondary | ICD-10-CM

## 2020-03-15 DIAGNOSIS — R5383 Other fatigue: Secondary | ICD-10-CM | POA: Insufficient documentation

## 2020-03-15 DIAGNOSIS — Z820 Family history of epilepsy and other diseases of the nervous system: Secondary | ICD-10-CM | POA: Insufficient documentation

## 2020-03-15 LAB — CBC
HCT: 37.8 % (ref 36.0–46.0)
Hemoglobin: 12.3 g/dL (ref 12.0–15.0)
MCH: 23.7 pg — ABNORMAL LOW (ref 26.0–34.0)
MCHC: 32.5 g/dL (ref 30.0–36.0)
MCV: 72.7 fL — ABNORMAL LOW (ref 80.0–100.0)
Platelets: 199 10*3/uL (ref 150–400)
RBC: 5.2 MIL/uL — ABNORMAL HIGH (ref 3.87–5.11)
RDW: 15.5 % (ref 11.5–15.5)
WBC: 3.5 10*3/uL — ABNORMAL LOW (ref 4.0–10.5)
nRBC: 0 % (ref 0.0–0.2)

## 2020-03-15 LAB — IRON AND TIBC
Iron: 87 ug/dL (ref 28–170)
Saturation Ratios: 32 % — ABNORMAL HIGH (ref 10.4–31.8)
TIBC: 270 ug/dL (ref 250–450)
UIBC: 183 ug/dL

## 2020-03-15 LAB — FERRITIN: Ferritin: 78 ng/mL (ref 11–307)

## 2020-03-15 LAB — DAT, POLYSPECIFIC AHG (ARMC ONLY): Polyspecific AHG test: NEGATIVE

## 2020-03-15 LAB — VITAMIN B12: Vitamin B-12: 436 pg/mL (ref 180–914)

## 2020-03-15 LAB — FOLATE: Folate: 20.8 ng/mL (ref >5.9)

## 2020-03-15 LAB — LACTATE DEHYDROGENASE: LDH: 166 U/L (ref 98–192)

## 2020-03-15 MED ORDER — HYDROCHLOROTHIAZIDE 12.5 MG PO TABS: 12.5000 mg | ORAL_TABLET | Freq: Every morning | ORAL | 1 refills | Status: DC

## 2020-03-15 NOTE — Patient Instructions (Addendum)
Blood Pressure Record Sheet  Blood pressure log Date: _______________________  a.m. _____________________(1st reading) _____________________(2nd reading)  p.m. _____________________(1st reading) _____________________(2nd reading) Date: _______________________  a.m. _____________________(1st reading) _____________________(2nd reading)  p.m. _____________________(1st reading) _____________________(2nd reading) Date: _______________________  a.m. _____________________(1st reading) _____________________(2nd reading)  p.m. _____________________(1st reading) _____________________(2nd reading) Date: _______________________  a.m. _____________________(1st reading) _____________________(2nd reading)  p.m. _____________________(1st reading) _____________________(2nd reading) Date: _______________________  a.m. _____________________(1st reading) _____________________(2nd reading)  p.m. _____________________(1st reading) _____________________(2nd reading) Date: _______________________  a.m. _____________________(1st reading) _____________________(2nd reading)  p.m. _____________________(1st reading) _____________________(2nd reading) Date: _______________________  a.m. _____________________(1st reading) _____________________(2nd reading)  p.m. _____________________(1st reading) _____________________(2nd reading) Date: _______________________  a.m. _____________________(1st reading) _____________________(2nd reading)  p.m. _____________________(1st reading) _____________________(2nd reading) Date: _______________________  a.m. _____________________(1st reading) _____________________(2nd reading)  p.m. _____________________(1st reading) _____________________(2nd reading) Date: _______________________  a.m. _____________________(1st reading) _____________________(2nd reading)  p.m. _____________________(1st reading) _____________________(2nd reading)       How to Take Your  Blood Pressure You can take your blood pressure at home with a machine. You may need to check your blood pressure at home:  To check if you have high blood pressure (hypertension).  To check your blood pressure over time.  To make sure your blood pressure medicine is working. Supplies needed: You will need a blood pressure machine, or monitor. You can buy one at a drugstore or online. When choosing one:  Choose one with an arm cuff.  Choose one that wraps around your upper arm. Only one finger should fit between your arm and the cuff.  Do not choose one that measures your blood pressure from your wrist or finger. Your doctor can suggest a monitor. How to prepare Avoid these things for 30 minutes before checking your blood pressure:  Drinking caffeine.  Drinking alcohol.  Eating.  Smoking.  Exercising. Five minutes before checking your blood pressure:  Pee.  Sit in a dining chair. Avoid sitting in a soft couch or armchair.  Be quiet. Do not talk. How to take your blood pressure Follow the instructions that came with your machine. If you have a digital blood pressure monitor, these may be the instructions: 1. Sit up straight. 2. Place your feet on the floor. Do not cross your ankles or legs. 3. Rest your left arm at the level of your heart. You may rest it on a table, desk, or chair. 4. Pull up your shirt sleeve. 5. Wrap the blood pressure cuff around the upper part of your left arm. The cuff should be 1 inch (2.5 cm) above your elbow. It is best to wrap the cuff around bare skin. 6. Fit the cuff snugly around your arm. You should be able to place only one finger between the cuff and your arm. 7. Put the cord inside the groove of your elbow. 8. Press the power button. 9. Sit quietly while the cuff fills with air and loses air. 10. Write down the numbers on the screen. 11. Wait 2-3 minutes and then repeat steps 1-10. What do the numbers mean? Two numbers make up your  blood pressure. The first number is called systolic pressure. The second is called diastolic pressure. An example of a blood pressure reading is "120 over 80" (or 120/80). If you are an adult and do not have a medical condition, use this guide to find out if your blood pressure is normal: Normal  First number: below 120.  Second number: below 80. Elevated  First number: 120-129.  Second number: below 80. Hypertension stage 1  First number:  130-139.  Second number: 80-89. Hypertension stage 2  First number: 140 or above.  Second number: 90 or above. Your blood pressure is above normal even if only the top or bottom number is above normal. Follow these instructions at home:  Check your blood pressure as often as your doctor tells you to.  Take your monitor to your next doctor's appointment. Your doctor will: ? Make sure you are using it correctly. ? Make sure it is working right.  Make sure you understand what your blood pressure numbers should be.  Tell your doctor if your medicines are causing side effects. Contact a doctor if:  Your blood pressure keeps being high. Get help right away if:  Your first blood pressure number is higher than 180.  Your second blood pressure number is higher than 120. This information is not intended to replace advice given to you by your health care provider. Make sure you discuss any questions you have with your health care provider. Document Revised: 05/11/2017 Document Reviewed: 11/05/2015 Elsevier Patient Education  2020 Elsevier Inc.    Preventing Hypertension Hypertension, commonly called high blood pressure, is when the force of blood pumping through the arteries is too strong. Arteries are blood vessels that carry blood from the heart throughout the body. Over time, hypertension can damage the arteries and decrease blood flow to important parts of the body, including the brain, heart, and kidneys. Often, hypertension does not  cause symptoms until blood pressure is very high. For this reason, it is important to have your blood pressure checked on a regular basis. Hypertension can often be prevented with diet and lifestyle changes. If you already have hypertension, you can control it with diet and lifestyle changes, as well as medicine. What nutrition changes can be made? Maintain a healthy diet. This includes:  Eating less salt (sodium). Ask your health care provider how much sodium is safe for you to have. The general recommendation is to consume less than 1 tsp (2,300 mg) of sodium a day. ? Do not add salt to your food. ? Choose low-sodium options when grocery shopping and eating out.  Limiting fats in your diet. You can do this by eating low-fat or fat-free dairy products and by eating less red meat.  Eating more fruits, vegetables, and whole grains. Make a goal to eat: ? 1-2 cups of fresh fruits and vegetables each day. ? 3-4 servings of whole grains each day.  Avoiding foods and beverages that have added sugars.  Eating fish that contain healthy fats (omega-3 fatty acids), such as mackerel or salmon. If you need help putting together a healthy eating plan, try the DASH diet. This diet is high in fruits, vegetables, and whole grains. It is low in sodium, red meat, and added sugars. DASH stands for Dietary Approaches to Stop Hypertension. What lifestyle changes can be made?   Lose weight if you are overweight. Losing just 3?5% of your body weight can help prevent or control hypertension. ? For example, if your present weight is 200 lb (91 kg), a loss of 3-5% of your weight means losing 6-10 lb (2.7-4.5 kg). ? Ask your health care provider to help you with a diet and exercise plan to safely lose weight.  Get enough exercise. Do at least 150 minutes of moderate-intensity exercise each week. ? You could do this in short exercise sessions several times a day, or you could do longer exercise sessions a few times  a week. For example, you could  take a brisk 10-minute walk or bike ride, 3 times a day, for 5 days a week.  Find ways to reduce stress, such as exercising, meditating, listening to music, or taking a yoga class. If you need help reducing stress, ask your health care provider.  Do not smoke. This includes e-cigarettes. Chemicals in tobacco and nicotine products raise your blood pressure each time you smoke. If you need help quitting, ask your health care provider.  Avoid alcohol. If you drink alcohol, limit alcohol intake to no more than 1 drink a day for nonpregnant women and 2 drinks a day for men. One drink equals 12 oz of beer, 5 oz of wine, or 1 oz of hard liquor. Why are these changes important? Diet and lifestyle changes can help you prevent hypertension, and they may make you feel better overall and improve your quality of life. If you have hypertension, making these changes will help you control it and help prevent major complications, such as:  Hardening and narrowing of arteries that supply blood to: ? Your heart. This can cause a heart attack. ? Your brain. This can cause a stroke. ? Your kidneys. This can cause kidney failure.  Stress on your heart muscle, which can cause heart failure. What can I do to lower my risk?  Work with your health care provider to make a hypertension prevention plan that works for you. Follow your plan and keep all follow-up visits as told by your health care provider.  Learn how to check your blood pressure at home. Make sure that you know your personal target blood pressure, as told by your health care provider. How is this treated? In addition to diet and lifestyle changes, your health care provider may recommend medicines to help lower your blood pressure. You may need to try a few different medicines to find what works best for you. You also may need to take more than one medicine. Take over-the-counter and prescription medicines only as told by your  health care provider. Where to find support Your health care provider can help you prevent hypertension and help you keep your blood pressure at a healthy level. Your local hospital or your community may also provide support services and prevention programs. The American Heart Association offers an online support network at: https://www.lee.net/ Where to find more information Learn more about hypertension from:  National Heart, Lung, and Blood Institute: https://www.peterson.org/  Centers for Disease Control and Prevention: AboutHD.co.nz  American Academy of Family Physicians: http://familydoctor.org/familydoctor/en/diseases-conditions/high-blood-pressure.printerview.all.html Learn more about the DASH diet from:  National Heart, Lung, and Blood Institute: WedMap.it Contact a health care provider if:  You think you are having a reaction to medicines you have taken.  You have recurrent headaches or feel dizzy.  You have swelling in your ankles.  You have trouble with your vision. Summary  Hypertension often does not cause any symptoms until blood pressure is very high. It is important to get your blood pressure checked regularly.  Diet and lifestyle changes are the most important steps in preventing hypertension.  By keeping your blood pressure in a healthy range, you can prevent complications like heart attack, heart failure, stroke, and kidney failure.  Work with your health care provider to make a hypertension prevention plan that works for you. This information is not intended to replace advice given to you by your health care provider. Make sure you discuss any questions you have with your health care provider. Document Revised: 09/20/2018 Document Reviewed: 02/07/2016 Elsevier Patient Education  2020 Elsevier Inc.   

## 2020-03-15 NOTE — Progress Notes (Signed)
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  INTERVAL HISTORY: Patient is a 49 year old female who has a complaint of chronic fatigue.  Recent blood work revealed leukopenia as well as microcytosis without anemia.  She otherwise feels well.  She has no neurologic complaints.  She denies any recent fevers or illnesses.  She has a good appetite and denies weight loss.  She has no chest pain, shortness of breath, cough, or hemoptysis.  She denies any nausea, vomiting, constipation, or diarrhea.  She has no melena or hematochezia.  She has no urinary complaints.  Patient otherwise feels well and offers no further specific complaints today.  REVIEW OF SYSTEMS:   Review of Systems  Constitutional: Positive for malaise/fatigue. Negative for fever and weight loss.  Respiratory: Negative.  Negative for cough, hemoptysis and shortness of breath.   Cardiovascular: Negative.  Negative for chest pain and leg swelling.  Gastrointestinal: Negative.  Negative for abdominal pain, blood in stool and melena.  Genitourinary: Negative.  Negative for hematuria.  Musculoskeletal: Negative.  Negative for back pain.  Skin: Negative.  Negative for rash.  Neurological: Positive for weakness. Negative for dizziness, focal weakness and headaches.  Psychiatric/Behavioral: Negative.  The patient is not nervous/anxious.     As per HPI. Otherwise, a complete review of systems is negative.  PAST MEDICAL HISTORY: Past Medical History:  Diagnosis Date  . Abdominal tenderness, right upper quadrant   . Abnormal Pap smear of cervix   . Allergic rhinitis, cause unspecified   . Anemia   . Chronic headaches     . Cough   . Family history of breast cancer   . Family history of pancreatic cancer    9/21 cancer genetic testing letter sent  . Lumbago   . Other and unspecified hyperlipidemia   . Swelling   . Thoracic or lumbosacral neuritis or radiculitis, unspecified   . Unspecified symptom associated with female genital organs   . Unspecified vitamin D deficiency     PAST SURGICAL HISTORY: Past Surgical History:  Procedure Laterality Date  . BREAST LUMPECTOMY Left   . COLONOSCOPY WITH PROPOFOL N/A 11/02/2017   Procedure: COLONOSCOPY WITH PROPOFOL;  Surgeon: Pasty Spillers, MD;  Location: [facility] ENDOSCOPY;  Service: Endoscopy;  Laterality: N/A;  . TUBAL LIGATION    . VAGINAL HYSTERECTOMY      FAMILY HISTORY: Family History  Problem Relation Age of Onset  . Stroke Mother   . Hypertension Mother   . Heart murmur Mother   . Hypertension Father   . Breast cancer Maternal Aunt   . Ovarian cancer Maternal Aunt   . Lupus Sister   . Lupus Daughter   . Liver cancer Maternal Grandfather   . Cirrhosis Maternal Grandfather   . Pancreatic cancer Maternal Grandfather   . Hypertension Paternal Grandmother   . Sickle cell anemia Sister   . Thyroid cancer Sister   . Seizures Sister   . Hashimoto's thyroiditis Sister   . Fibromyalgia Sister     ADVANCED DIRECTIVES (Y/N):  N  HEALTH MAINTENANCE: Social History   Tobacco Use  . Smoking status: Never Smoker  . Smokeless tobacco: Never Used  Vaping Use  . Vaping Use: Never used  Substance Use  Topics  . Alcohol use: No  . Drug use: No     Colonoscopy:  PAP:  Bone density:  Lipid panel:  Allergies  Allergen Reactions  . Erythromycin Other (See Comments)  . Ibuprofen Hives  . Vibramycin [Doxycycline Calcium] Hives    Current Outpatient Medications  Medication Sig Dispense Refill  . acetaminophen (TYLENOL) 325 MG tablet Take 650 mg by mouth as needed.    Marland Kitchen azelastine (ASTELIN) 0.1 % nasal spray Place into the nose.    .  hydrochlorothiazide (HYDRODIURIL) 12.5 MG tablet Take 1 tablet (12.5 mg total) by mouth in the morning. 90 tablet 1  . Multiple Vitamin (MULTIVITAMIN WITH MINERALS) TABS tablet Take 1 tablet by mouth daily.    . Vitamin D, Ergocalciferol, (DRISDOL) 1.25 MG (50000 UNIT) CAPS capsule Take 1 capsule (50,000 Units total) by mouth every 7 (seven) days. 4 capsule 2   No current facility-administered medications for this visit.    OBJECTIVE: Vitals:   03/15/20 0959  BP: (!) 144/86  Pulse: 64  Resp: 20  Temp: 98.2 F (36.8 C)  SpO2: 100%     Body mass index is 33.07 kg/m.    ECOG FS:0 - Asymptomatic  General: Well-developed, well-nourished, no acute distress. Eyes: Pink conjunctiva, anicteric sclera. HEENT: Normocephalic, moist mucous membranes. Lungs: No audible wheezing or coughing. Heart: Regular rate and rhythm. Abdomen: Soft, nontender, no obvious distention. Musculoskeletal: No edema, cyanosis, or clubbing. Neuro: Alert, answering all questions appropriately. Cranial nerves grossly intact. Skin: No rashes or petechiae noted. Psych: Normal affect. Lymphatics: No cervical, calvicular, axillary or inguinal LAD.   LAB RESULTS:  Lab Results  Component Value Date   NA 138 03/09/2020   K 3.9 03/09/2020   CL 102 03/09/2020   CO2 25 03/09/2020   GLUCOSE 104 (H) 09/28/2017   BUN 8 03/09/2020   CREATININE 0.89 03/09/2020   CALCIUM 8.9 03/09/2020   PROT 6.6 03/09/2020   ALBUMIN 3.9 03/09/2020   AST 24 03/09/2020   ALT 17 03/09/2020   ALKPHOS 63 03/09/2020   BILITOT 0.8 03/09/2020   GFRNONAA 76 03/09/2020   GFRAA 88 03/09/2020    Lab Results  Component Value Date   WBC 3.5 (L) 03/15/2020   NEUTROABS 1.4 03/09/2020   HGB 12.3 03/15/2020   HCT 37.8 03/15/2020   MCV 72.7 (L) 03/15/2020   PLT 199 03/15/2020   Lab Results  Component Value Date   IRON 87 03/15/2020   TIBC 270 03/15/2020   IRONPCTSAT 32 (H) 03/15/2020   Lab Results  Component Value Date   FERRITIN 78  03/15/2020     STUDIES: No results found.  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  PLAN:    1.  Microcytosis without anemia: Patient's hemoglobin and iron stores are within normal limits.  She had a colonoscopy in 2019 that did not reveal any distinct pathology.  Patient is status post hysterectomy.  Her saturation ratio is mildly elevated, but this is likely clinically insignificant.  She has no evidence of hemolysis.  Folic acid is within normal limits and B12 levels are pending at time of dictation.  Have ordered hemoglobin electrophoresis for completeness.  No intervention is needed at this time.  Patient will have video assisted telemedicine visit in 3 weeks to discuss her results. 2.  Leukopenia: Mild.  Antineutrophil antibodies and peripheral blood flow cytometry were ordered for completeness and are pending at time of dictation.  3.  Fatigue: Likely multifactorial, but appears unrelated to blood work.  I spent a total of 45 minutes reviewing chart data, face-to-face evaluation with the patient, counseling and coordination of care as detailed above.   Patient expressed understanding and was in agreement with this plan. She also understands that She can call clinic at any time with any questions, concerns, or complaints.     Jeralyn Ruths, MD   03/15/2020 12:43 PM

## 2020-03-16 LAB — HAPTOGLOBIN: Haptoglobin: <10 mg/dL — ABNORMAL LOW (ref 42–296)

## 2020-03-16 LAB — ERYTHROPOIETIN: Erythropoietin: 40.5 m[IU]/mL — ABNORMAL HIGH (ref 2.6–18.5)

## 2020-03-17 LAB — COMP PANEL: LEUKEMIA/LYMPHOMA

## 2020-03-18 LAB — HGB FRACTIONATION CASCADE

## 2020-03-18 LAB — HGB FRACTIONATION BY HPLC
Hgb A2: 3 % (ref 1.8–3.2)
Hgb A2: 3 % (ref 1.8–3.2)
Hgb A: 64.1 % — ABNORMAL LOW (ref 96.4–98.8)
Hgb A: 64.1 % — ABNORMAL LOW (ref 96.4–98.8)
Hgb C: 32.9 % — ABNORMAL HIGH
Hgb C: 32.9 % — ABNORMAL HIGH
Hgb E: 0 %
Hgb E: 0 %
Hgb F: 0 % (ref 0.0–2.0)
Hgb F: 0 % (ref 0.0–2.0)
Hgb S: 0 %
Hgb S: 0 %
Hgb Variant: 0 %
Hgb Variant: 0 %

## 2020-03-24 LAB — NEUTROPHIL AB TEST LEVEL 1: NEUTROPHIL SCR/PANEL INTERP.: POSITIVE — AB

## 2020-03-26 LAB — FECAL OCCULT BLOOD, IMMUNOCHEMICAL: Fecal Occult Bld: NEGATIVE

## 2020-03-26 LAB — SPECIMEN STATUS REPORT

## 2020-03-31 ENCOUNTER — Other Ambulatory Visit (HOSPITAL_COMMUNITY): Payer: Self-pay | Admitting: Family Medicine

## 2020-03-31 ENCOUNTER — Other Ambulatory Visit: Payer: Self-pay | Admitting: Family Medicine

## 2020-03-31 ENCOUNTER — Ambulatory Visit: Payer: Self-pay

## 2020-03-31 MED ORDER — LOSARTAN POTASSIUM-HCTZ 50-12.5 MG PO TABS: 1.0000 | ORAL_TABLET | ORAL | 3 refills | Status: DC

## 2020-03-31 NOTE — Telephone Encounter (Signed)
Pt. Reports she started HCTZ but is still having high BP readings.Having headache, blurred vision, dizziness.BP readings 150/80-90. Asking if medication needs to be increased. Please advise pt. Instructed to go to ED for worsening of symptoms. Answer Assessment - Initial Assessment Questions 1. BLOOD PRESSURE: "What is the blood pressure?" "Did you take at least two measurements 5 minutes apart?"     150/83 2. ONSET: "When did you take your blood pressure?"     2 Days ago 3. HOW: "How did you obtain the blood pressure?" (e.g., visiting nurse, automatic home BP monitor)     Home cuff and checked at work 4. HISTORY: "Do you have a history of high blood pressure?"     Yes 5. MEDICATIONS: "Are you taking any medications for blood pressure?" "Have you missed any doses recently?"     No 6. OTHER SYMPTOMS: "Do you have any symptoms?" (e.g., headache, chest pain, blurred vision, difficulty breathing, weakness)     Headache, blurred vision 7. PREGNANCY: "Is there any chance you are pregnant?" "When was your last menstrual period?"     No  Protocols used: BLOOD PRESSURE - HIGH-A-AH

## 2020-03-31 NOTE — Telephone Encounter (Signed)
Pt was called and appt for 04-08-2020 made and message was given about medication called in

## 2020-04-02 NOTE — Progress Notes (Signed)
[facility]  [phone] [phone]) [phone]  ID: Debra Stephens OB: [birth year]  MR#: [id number]  NOI#:[id number]  Patient Care Team: Delsa Grana, PA-C as PCP - General (Family Medicine) Rico Junker, RN as Registered Nurse Theodore Demark, RN as Registered Nurse  I connected with Debra Stephens on 04/06/20 at  2:45 PM EDT by video enabled telemedicine visit and verified that I am speaking with the correct person using two identifiers.   I discussed the limitations, risks, security and privacy concerns of performing an evaluation and management service by telemedicine and the availability of in-person appointments. I also discussed with the patient that there may be a patient responsible charge related to this service. The patient expressed understanding and agreed to proceed.   Other persons participating in the visit and their role in the encounter: Patient, MD.  Patient's location: Work. Provider's location: Clinic.  CHIEF COMPLAINT: Hemoglobin C, leukopenia.  INTERVAL HISTORY: Patient agreed to video assisted telemedicine visit for further evaluation and discussion of her laboratory results.  She continues to feel well and remains asymptomatic.  She does not complain of any weakness or fatigue. She has no neurologic complaints.  She denies any recent fevers or illnesses.  She has a good appetite and denies weight loss.  She has no chest pain, shortness of breath, cough, or hemoptysis.  She denies any nausea, vomiting, constipation, or diarrhea.  She has no melena or hematochezia.  She has no urinary complaints.  Patient offers no specific complaints today.  REVIEW OF SYSTEMS:   Review of Systems  Constitutional: Negative.  Negative for fever, malaise/fatigue and weight loss.  Respiratory: Negative.  Negative for cough, hemoptysis and shortness of breath.   Cardiovascular: Negative.  Negative for chest pain and leg swelling.  Gastrointestinal:  Negative.  Negative for abdominal pain, blood in stool and melena.  Genitourinary: Negative.  Negative for hematuria.  Musculoskeletal: Negative.  Negative for back pain.  Skin: Negative.  Negative for rash.  Neurological: Negative.  Negative for dizziness, focal weakness, weakness and headaches.  Psychiatric/Behavioral: Negative.  The patient is not nervous/anxious.     As per HPI. Otherwise, a complete review of systems is negative.  PAST MEDICAL HISTORY: Past Medical History:  Diagnosis Date  . Abdominal tenderness, right upper quadrant   . Abnormal Pap smear of cervix   . Allergic rhinitis, cause unspecified   . Anemia   . Chronic headaches   . Cough   . Family history of breast cancer   . Family history of pancreatic cancer    9/21 cancer genetic testing letter sent  . Lumbago   . Other and unspecified hyperlipidemia   . Swelling   . Thoracic or lumbosacral neuritis or radiculitis, unspecified   . Unspecified symptom associated with female genital organs   . Unspecified vitamin D deficiency     PAST SURGICAL HISTORY: Past Surgical History:  Procedure Laterality Date  . BREAST LUMPECTOMY Left   . COLONOSCOPY WITH PROPOFOL N/A 11/02/2017   Procedure: COLONOSCOPY WITH PROPOFOL;  Surgeon: Virgel Manifold, MD;  Location: [facility] ENDOSCOPY;  Service: Endoscopy;  Laterality: N/A;  . TUBAL LIGATION    . VAGINAL HYSTERECTOMY      FAMILY HISTORY: Family History  Problem Relation Age of Onset  . Stroke Mother   . Hypertension Mother   . Heart murmur Mother   . Hypertension Father   . Breast cancer Maternal Aunt   . Ovarian cancer Maternal Aunt   .  Lupus Sister   . Lupus Daughter   . Liver cancer Maternal Grandfather   . Cirrhosis Maternal Grandfather   . Pancreatic cancer Maternal Grandfather   . Hypertension Paternal Grandmother   . Sickle cell anemia Sister   . Thyroid cancer Sister   . Seizures Sister   . Hashimoto's thyroiditis Sister   . Fibromyalgia Sister      ADVANCED DIRECTIVES (Y/N):  N  HEALTH MAINTENANCE: Social History   Tobacco Use  . Smoking status: Never Smoker  . Smokeless tobacco: Never Used  Vaping Use  . Vaping Use: Never used  Substance Use Topics  . Alcohol use: No  . Drug use: No     Colonoscopy:  PAP:  Bone density:  Lipid panel:  Allergies  Allergen Reactions  . Erythromycin Other (See Comments)  . Ibuprofen Hives  . Vibramycin [Doxycycline Calcium] Hives    Current Outpatient Medications  Medication Sig Dispense Refill  . acetaminophen (TYLENOL) 325 MG tablet Take 650 mg by mouth as needed.    Marland Kitchen azelastine (ASTELIN) 0.1 % nasal spray Place into the nose.    . losartan-hydrochlorothiazide (HYZAAR) 50-12.5 MG tablet Take 1 tablet by mouth every morning. 90 tablet 3  . Multiple Vitamin (MULTIVITAMIN WITH MINERALS) TABS tablet Take 1 tablet by mouth daily.    . Vitamin D, Ergocalciferol, (DRISDOL) 1.25 MG (50000 UNIT) CAPS capsule Take 1 capsule (50,000 Units total) by mouth every 7 (seven) days. 4 capsule 2   No current facility-administered medications for this visit.    OBJECTIVE: There were no vitals filed for this visit.   There is no height or weight on file to calculate BMI.    ECOG FS:0 - Asymptomatic  General: Well-developed, well-nourished, no acute distress. HEENT: Normocephalic. Neuro: Alert, answering all questions appropriately. Cranial nerves grossly intact. Psych: Normal affect.   LAB RESULTS:  Lab Results  Component Value Date   NA 138 03/09/2020   K 3.9 03/09/2020   CL 102 03/09/2020   CO2 25 03/09/2020   GLUCOSE 104 (H) 09/28/2017   BUN 8 03/09/2020   CREATININE 0.89 03/09/2020   CALCIUM 8.9 03/09/2020   PROT 6.6 03/09/2020   ALBUMIN 3.9 03/09/2020   AST 24 03/09/2020   ALT 17 03/09/2020   ALKPHOS 63 03/09/2020   BILITOT 0.8 03/09/2020   GFRNONAA 76 03/09/2020   GFRAA 88 03/09/2020    Lab Results  Component Value Date   WBC 3.5 (L) 03/15/2020   NEUTROABS 1.4  03/09/2020   HGB 12.3 03/15/2020   HCT 37.8 03/15/2020   MCV 72.7 (L) 03/15/2020   PLT 199 03/15/2020   Lab Results  Component Value Date   IRON 87 03/15/2020   TIBC 270 03/15/2020   IRONPCTSAT 32 (H) 03/15/2020   Lab Results  Component Value Date   FERRITIN 78 03/15/2020     STUDIES: No results found.  ASSESSMENT: Hemoglobin C, leukopenia.  PLAN:    1.  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  Clinically insignificant, but explains patient's microcytosis.  All of her other laboratory work including iron stores is within normal limits.  No further intervention is needed. 2.  Leukopenia: Patient noted to have antineutrophil antibodies that may be causing her decreased white blood cell counts.  These can occasionally be transient in nature.  No intervention is needed.  Patient does not require bone marrow biopsy.  Return to clinic in 6 months with repeat laboratory work and further evaluation. 3.  Microcytosis: Secondary to hemoglobin C.  I provided 30 minutes of face-to-face video visit time during this encounter which included chart review, counseling, and coordination of care as documented above.   Patient expressed understanding and was in agreement with this plan. She also understands that She can call clinic at any time with any questions, concerns, or complaints.     Lloyd Huger, MD   04/06/2020 3:36 PM

## 2020-04-06 ENCOUNTER — Encounter: Payer: Self-pay | Admitting: Oncology

## 2020-04-06 ENCOUNTER — Inpatient Hospital Stay (HOSPITAL_BASED_OUTPATIENT_CLINIC_OR_DEPARTMENT_OTHER): Payer: No Typology Code available for payment source | Admitting: Oncology

## 2020-04-06 DIAGNOSIS — D72819 Decreased white blood cell count, unspecified: Secondary | ICD-10-CM | POA: Diagnosis not present

## 2020-04-06 DIAGNOSIS — R718 Other abnormality of red blood cells: Secondary | ICD-10-CM

## 2020-04-06 NOTE — Progress Notes (Signed)
Patient denies any pain or concerns at this time. She reports elevated bp but states primary is following her concerning this issue. She is interested and nervous about her results here.

## 2020-04-08 ENCOUNTER — Encounter: Payer: Self-pay | Admitting: Family Medicine

## 2020-04-08 ENCOUNTER — Ambulatory Visit (INDEPENDENT_AMBULATORY_CARE_PROVIDER_SITE_OTHER): Payer: No Typology Code available for payment source | Admitting: Family Medicine

## 2020-04-08 ENCOUNTER — Other Ambulatory Visit: Payer: Self-pay

## 2020-04-08 VITALS — BP 128/76 | HR 94 | Temp 98.0°F | Resp 18 | Ht 66.0 in | Wt 206.3 lb

## 2020-04-08 DIAGNOSIS — D582 Other hemoglobinopathies: Secondary | ICD-10-CM

## 2020-04-08 DIAGNOSIS — E669 Obesity, unspecified: Secondary | ICD-10-CM

## 2020-04-08 DIAGNOSIS — E8881 Metabolic syndrome: Secondary | ICD-10-CM | POA: Diagnosis not present

## 2020-04-08 DIAGNOSIS — I10 Essential (primary) hypertension: Secondary | ICD-10-CM | POA: Diagnosis not present

## 2020-04-08 NOTE — Progress Notes (Signed)
Patient ID: Debra Stephens, female    DOB: [birth year], 49 y.o.   MRN: [id number]  PCP: Danelle Berry, PA-C  Chief Complaint  Patient presents with  . Hypertension    162/80 at home 134/78    Subjective:   Debra Stephens is a 49 y.o. female, presents to clinic with CC of the following:  F/up on Hypertension:  Currently managed on losartan-HCTZ 50/12.5 Pt reports good med compliance and denies any SE.   Blood pressure today is well controlled. BP Readings from Last 3 Encounters:  04/08/20 128/76  03/15/20 (!) 144/86  03/15/20 (!) 142/78   Pt denies CP, SOB, exertional sx, LE edema, palpitation, Ha's, visual disturbances, lightheadedness, hypotension, syncope.  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     Patient Active Problem List   Diagnosis Date Noted  . Vitamin B12 deficiency 01/29/2018  . Microcytosis 01/29/2018  . Recurrent sinusitis 12/14/2017  . Dysmetabolic syndrome 11/08/2017  . Special screening for malignant neoplasms, colon   . Chronic idiopathic constipation 08/13/2017  . Obesity (BMI 30-39.9) 07/27/2017  . 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 Vitamin D deficiency 11/22/2016  . ANA positive 05/24/2016  . H/O urticaria 09/08/2015  . Angio-edema 09/08/2015  . Atypical chest pain 06/24/2013      Current Outpatient Medications:  .  acetaminophen (TYLENOL) 325 MG tablet, Take 650 mg by mouth as needed., Disp: , Rfl:  .  azelastine (ASTELIN) 0.1 % nasal spray, Place into  the nose., Disp: , Rfl:  .  losartan-hydrochlorothiazide (HYZAAR) 50-12.5 MG tablet, Take 1 tablet by mouth every morning., Disp: 90 tablet, Rfl: 3 .  Multiple Vitamin (MULTIVITAMIN WITH MINERALS) TABS tablet, Take 1 tablet by mouth daily., Disp: , Rfl:  .  Vitamin D, Ergocalciferol, (DRISDOL) 1.25 MG (50000 UNIT) CAPS capsule, Take 1 capsule (50,000 Units total) by mouth every 7 (seven) days., Disp: 4 capsule, Rfl: 2   Allergies  Allergen Reactions  . Erythromycin Other (See Comments)  . Ibuprofen Hives  . Vibramycin [Doxycycline Calcium] Hives     Social History   Tobacco Use  . Smoking status: Never Smoker  . Smokeless tobacco: Never Used  Vaping Use  . Vaping Use: Never used  Substance Use Topics  . Alcohol use: No  . Drug use: No      Chart Review Today: I personally reviewed active problem list, medication list, allergies, family history, social history, health maintenance, notes from last encounter, lab results, imaging with the patient/caregiver today.   Review of Systems 10 Systems reviewed and are negative for acute change except as noted in the HPI.     Objective:   Vitals:   04/08/20 1404  BP: 128/76  Pulse: 94  Resp: 18  Temp: 98 F (36.7 C)  TempSrc: Oral  SpO2: 96%  Weight: 206 lb 4.8 oz (93.6 kg)  Height: 5\' 6"  (1.676 m)    Body mass index is 33.3 kg/m.  Physical Exam Vitals  and nursing note reviewed.  Constitutional:      General: She is not in acute distress.    Appearance: She is obese. She is not toxic-appearing.  HENT:     Head: Normocephalic.  Cardiovascular:     Rate and Rhythm: Normal rate and regular rhythm.     Pulses: Normal pulses.     Heart sounds: Normal heart sounds. No murmur heard.  No friction rub. No gallop.   Pulmonary:     Effort: Pulmonary effort is normal. No respiratory distress.     Breath sounds: Normal breath sounds. No stridor. No wheezing or rales.  Musculoskeletal:     Right lower leg: No edema.      Left lower leg: No edema.  Skin:    General: Skin is warm and dry.  Neurological:     Mental Status: She is alert. Mental status is at baseline.     Gait: Gait normal.  Psychiatric:        Mood and Affect: Mood normal.      Results for orders placed or performed in visit on 03/15/20  Folic Acid  Result Value Ref Range   Folate 20.8 >5.9 ng/mL  Haptoglobin  Result Value Ref Range   Haptoglobin <10 (L) 42 - 296 mg/dL  Vitamin Z60  Result Value Ref Range   Vitamin B-12 436 180 - 914 pg/mL  Erythropoietin  Result Value Ref Range   Erythropoietin 40.5 (H) 2.6 - 18.5 mIU/mL  Lactate dehydrogenase  Result Value Ref Range   LDH 166 98 - 192 U/L  Iron and TIBC  Result Value Ref Range   Iron 87 28 - 170 ug/dL   TIBC 109 323 - 557 ug/dL   Saturation Ratios 32 (H) 10.4 - 31.8 %   UIBC 183 ug/dL  Ferritin  Result Value Ref Range   Ferritin 78 11 - 307 ng/mL  CBC  Result Value Ref Range   WBC 3.5 (L) 4.0 - 10.5 K/uL   RBC 5.20 (H) 3.87 - 5.11 MIL/uL   Hemoglobin 12.3 12.0 - 15.0 g/dL   HCT 32.2 36 - 46 %   MCV 72.7 (L) 80.0 - 100.0 fL   MCH 23.7 (L) 26.0 - 34.0 pg   MCHC 32.5 30.0 - 36.0 g/dL   RDW 02.5 42.7 - 06.2 %   Platelets 199 150 - 400 K/uL   nRBC 0.0 0.0 - 0.2 %  Hgb Fractionation Cascade  Result Value Ref Range   Hgb F Reflexed 0.0 - 2.0 %   Hgb A Reflexed 96.4 - 98.8 %   Hgb A2 Reflexed 1.8 - 3.2 %   Hgb S Reflexed 0.0 %   Interpretation, Hgb Fract Comment   Flow cytometry panel-leukemia/lymphoma work-up  Result Value Ref Range   PATH INTERP XXX-IMP Comment    ANNOTATION COMMENT IMP Comment    CLINICAL INFO Comment    Specimen Type Comment    ASSESSMENT OF LEUKOCYTES Comment    % Viable Cells Comment    ANALYSIS AND GATING STRATEGY Comment    IMMUNOPHENOTYPING STUDY Comment    PATHOLOGIST NAME Comment    COMMENT: Comment   NEUTROPHIL AB TEST LEVEL 1  Result Value Ref Range   NEUTROPHIL SCR/PANEL INTERP. Positive (A)   Hgb Fractionation by HPLC    Result Value Ref Range   Hgb F 0.0 0.0 - 2.0 %   Hgb A 64.1 (L) 96.4 - 98.8 %   Hgb A2 3.0 1.8 - 3.2 %  Hgb S 0.0 0.0 %   Hgb C 32.9 (H) 0.0 %   Hgb E 0.0 0.0 %   Hgb Variant 0.0 0.0 %   Final Interpretation: Comment   DAT, polyspecific, [facility])  Result Value Ref Range   Polyspecific [facility] test      NEG Performed at [facility], [address], [place], [place] [address]        Assessment & Plan:   Problem List Items Addressed This Visit      Cardiovascular and Mediastinum   Hypertension - Primary    BP Readings from Last 3 Encounters:  04/08/20 128/76  03/15/20 (!) 144/86  03/15/20 (!) 142/78   BP improved today and at goal, pt doing well with losartan-HCTZ, good compliance, no current SE or concerns Pt encouraged to continue to work on DASH and healthy lifestyle/exercise Continue meds Will recheck labs at next OV        Other   Obesity (BMI 30-39.9)    Obesity with previous dx of metabolic dysfunction, she has HTN and HLD, no past prediabetes and past TSH was normal Pt wanted to ask about weight loss meds - encouraged her to start working on tracking or observing caloric intake, goal to be net negative relative to basal metabolic rate, and increase activity Will review med options when she returns with info and begins efforts      Dysmetabolic syndrome    See obesity A&P - GLP-1? Nutritional referral or medical weight management referral are options for pt      Hemoglobin C (Hb-C) (HCC)    Reviewed recent work up and labs per hem/onc, pt has stable and normal H/H, microcytosis, and work up revealed Hemoglobin C - no past transfusions and no current sx. Reviewed and discussed with pt, reassured         Return in about 1 month (around 05/09/2020) for f/up wieght/med options.     Danelle Berry, PA-C 04/08/20 2:26 PM

## 2020-04-14 ENCOUNTER — Other Ambulatory Visit: Payer: Self-pay

## 2020-04-14 ENCOUNTER — Ambulatory Visit
Admission: RE | Admit: 2020-04-14 | Discharge: 2020-04-14 | Disposition: A | Discharge status: Home / Self Care | Payer: No Typology Code available for payment source | Source: Ambulatory Visit | Attending: Obstetrics & Gynecology | Admitting: Obstetrics & Gynecology

## 2020-04-14 DIAGNOSIS — Z1231 Encounter for screening mammogram for malignant neoplasm of breast: Secondary | ICD-10-CM | POA: Diagnosis not present

## 2020-04-16 ENCOUNTER — Ambulatory Visit: Payer: No Typology Code available for payment source | Admitting: Family Medicine

## 2020-04-22 ENCOUNTER — Encounter: Payer: Self-pay | Admitting: Family Medicine

## 2020-04-22 DIAGNOSIS — D582 Other hemoglobinopathies: Secondary | ICD-10-CM | POA: Insufficient documentation

## 2020-04-22 DIAGNOSIS — I10 Essential (primary) hypertension: Secondary | ICD-10-CM | POA: Insufficient documentation

## 2020-04-22 NOTE — Assessment & Plan Note (Signed)
Obesity with previous dx of metabolic dysfunction, she has HTN and HLD, no past prediabetes and past TSH was normal Pt wanted to ask about weight loss meds - encouraged her to start working on tracking or observing caloric intake, goal to be net negative relative to basal metabolic rate, and increase activity Will review med options when she returns with info and begins efforts

## 2020-04-22 NOTE — Assessment & Plan Note (Signed)
See obesity A&P - GLP-1? Nutritional referral or medical weight management referral are options for pt

## 2020-04-22 NOTE — Assessment & Plan Note (Signed)
Reviewed recent work up and labs per hem/onc, pt has stable and normal H/H, microcytosis, and work up revealed Hemoglobin C - no past transfusions and no current sx. Reviewed and discussed with pt, reassured

## 2020-04-22 NOTE — Assessment & Plan Note (Signed)
BP Readings from Last 3 Encounters:  04/08/20 128/76  03/15/20 (!) 144/86  03/15/20 (!) 142/78   BP improved today and at goal, pt doing well with losartan-HCTZ, good compliance, no current SE or concerns Pt encouraged to continue to work on DASH and healthy lifestyle/exercise Continue meds Will recheck labs at next OV

## 2020-04-30 MED FILL — LOSARTAN-HCTZ 50-12.5 MG TA: 50-12.5 | 30 days supply | Qty: 30 | Fill #0

## 2020-04-30 MED FILL — VIT D2 1.25 MG (50,000 UNIT: 1.25 MG | 28 days supply | Qty: 4 | Fill #0

## 2020-05-13 ENCOUNTER — Other Ambulatory Visit: Payer: Self-pay

## 2020-05-13 ENCOUNTER — Encounter: Payer: Self-pay | Admitting: Internal Medicine

## 2020-05-13 ENCOUNTER — Ambulatory Visit (INDEPENDENT_AMBULATORY_CARE_PROVIDER_SITE_OTHER): Payer: No Typology Code available for payment source | Admitting: Internal Medicine

## 2020-05-13 DIAGNOSIS — J069 Acute upper respiratory infection, unspecified: Secondary | ICD-10-CM

## 2020-05-13 DIAGNOSIS — J040 Acute laryngitis: Secondary | ICD-10-CM | POA: Diagnosis not present

## 2020-05-13 NOTE — Progress Notes (Signed)
Name: Debra Stephens   MRN: [id number]    DOB: 05/16/1971   Date:05/13/2020       Progress Note  Subjective  Chief Complaint  Chief Complaint  Patient presents with  . Sore Throat  . Ear Pain    I connected with  Breanna A Newstrom on 05/13/20 at  1:40 PM EST by telephone and verified that I am speaking with the correct person using two identifiers.  I discussed the limitations, risks, security and privacy concerns of performing an evaluation and management service by telephone and the availability of in person appointments. The patient expressed understanding and agreed to proceed. Staff also discussed with the patient that there may be a patient responsible charge related to this service. Patient Location: Home Provider Location: [facility] Additional Individuals present: none  HPI  Patient is a 49 year old female patient of Danelle Berry Last visit with her was 04/08/2020 Follows up today with a phone visit for sore throat and ear pain   Covid immunization status- fully vaccinated Symptoms started Sunday and worked Monday and then on Tuesday am, sx's increased and had Covid test on Tuesday am and negative result  + slight cough, no production except clear phlegm and thick at times No marked SOB No fever, no chills no sweats No lumps in neck,mildly tender on left side Trying to look in throat, no obvious white spots noted by her. + sore throat,  left side of throat, very hoarse  Has some ear discomfort, not severe pain + mild congestion, + PND - mucus clear No loss of smell, loss of taste, appetite down some No N/V No muscle aches No marked loose stools/diarrhea No CP, passing out episodes Taking hot tea with honey, nasocort nasal spray. OTC allergy med, mucinex  Comorbid conditions reviewed No asthma/COPD hx,  No h/o DM,  + obesity  No tob use Works in [employer] Patient Active Problem List   Diagnosis Date Noted  . 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  Hypertension 04/22/2020  . Vitamin B12 deficiency 01/29/2018  . Recurrent sinusitis 12/14/2017  . Dysmetabolic syndrome 11/08/2017  . Chronic idiopathic constipation 08/13/2017  . Obesity (BMI 30-39.9) 07/27/2017  . 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 Vitamin D deficiency 11/22/2016  . ANA positive 05/24/2016  . H/O urticaria 09/08/2015  . Angio-edema 09/08/2015    Past Surgical History:  Procedure Laterality Date  . BREAST EXCISIONAL BIOPSY Left    age 58  . COLONOSCOPY WITH PROPOFOL N/A 11/02/2017   Procedure: COLONOSCOPY WITH PROPOFOL;  Surgeon: Pasty Spillers, MD;  Location: [facility] ENDOSCOPY;  Service: Endoscopy;  Laterality: N/A;  . TUBAL LIGATION    . VAGINAL HYSTERECTOMY      Family History  Problem Relation Age of Onset  . Stroke Mother   . Hypertension Mother   . Heart murmur Mother   . Hypertension Father   . Breast cancer Maternal Aunt   . Ovarian cancer Maternal Aunt   . Lupus Sister   . Lupus Daughter   . Liver cancer Maternal Grandfather   . Cirrhosis Maternal Grandfather   . Pancreatic cancer Maternal Grandfather   . Hypertension Paternal Grandmother   . Sickle cell anemia Sister   . Thyroid cancer Sister   . Seizures Sister   . Hashimoto's thyroiditis Sister   . Fibromyalgia Sister     Social History   Tobacco Use  . Smoking status: Never Smoker  . Smokeless tobacco: Never Used  Substance  Use Topics  . Alcohol use: No     Current Outpatient Medications:  .  acetaminophen (TYLENOL) 325 MG tablet, Take 650 mg by mouth as needed., Disp: , Rfl:  .  fexofenadine (ALLEGRA) 180 MG tablet, Take 180 mg by mouth daily., Disp: , Rfl:  .  losartan-hydrochlorothiazide (HYZAAR) 50-12.5 MG tablet, Take 1 tablet by mouth every morning., Disp: 90 tablet, Rfl: 3 .  Multiple Vitamin (MULTIVITAMIN WITH MINERALS) TABS tablet, Take 1 tablet by mouth daily., Disp: , Rfl:  .  triamcinolone (NASACORT ALLERGY 24HR) 55 MCG/ACT AERO nasal inhaler, Place 2  sprays into the nose daily., Disp: , Rfl:  .  Vitamin D, Ergocalciferol, (DRISDOL) 1.25 MG (50000 UNIT) CAPS capsule, Take 1 capsule (50,000 Units total) by mouth every 7 (seven) days., Disp: 4 capsule, Rfl: 2  Allergies  Allergen Reactions  . Erythromycin Other (See Comments)  . Ibuprofen Hives  . Vibramycin [Doxycycline Calcium] Hives    With staff assistance, above reviewed with the patient today.  ROS: As per HPI, otherwise no specific complaints on a limited and focused system review   Objective  Virtual encounter, vitals not obtained.  There is no height or weight on file to calculate BMI.  Physical Exam   Appears in NAD via conversation, very hoarse in conversation, pleasant Breathing: No obvious respiratory distress. Speaking in complete sentences Neurological: Pt is alert, Speech is normal Psychiatric: Patient has a normal mood and affect, Judgment and thought content normal.   No results found for this or any previous visit (from the past 72 hour(s)).  PHQ2/9: Depression screen [facility] 2/9 05/13/2020 04/08/2020 03/15/2020 12/09/2018 01/29/2018  Decreased Interest 0 0 0 0 0  Down, Depressed, Hopeless 0 0 1 0 0  PHQ - 2 Score 0 0 1 0 0  Altered sleeping - - - 0 -  Tired, decreased energy - - - 0 -  Change in appetite - - - 0 -  Feeling bad or failure about yourself  - - - 0 -  Trouble concentrating - - - 0 -  Moving slowly or fidgety/restless - - - 0 -  Suicidal thoughts - - - 0 -  PHQ-9 Score - - - 0 -  Difficult doing work/chores - - - Not difficult at all -   PHQ-2/9 Result reviewed  Fall Risk: Fall Risk  05/13/2020 04/08/2020 03/15/2020 12/09/2018 01/29/2018  Falls in the past year? 0 0 0 0 No  Number falls in past yr: 0 0 0 0 -  Injury with Fall? 0 0 0 0 -  Follow up - Falls evaluation completed Falls evaluation completed - -     Assessment & Plan  1. Laryngitis 2. Viral upper respiratory tract infection  Patient noted that her symptoms have been present  for about 3 to 4 days now, with her just about losing her voice being the most problematic part.  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  Do feel this is likely a viral source, as often is the case with a laryngitis.  She has had no fevers, no discolored mucus, and with her help, does not have any marked lymphadenopathy or exudates as part of the concerning triad for potential strep throat.  I did discuss the limitations with this being a phone visit. Felt best to proceed as follows: We will treat as a  viral infection and recommended rest and increase fluids, and including warm teas and honey with that as she has been doing. Also recommended ibuprofen products with food as she does okay with those, take 3-4 times a day as needed as they have anti-inflammatory properties to help and can help with discomfort Also recommended lozenges or throat sprays as needed in the short-term. She can take the Mucinex product as needed as an expectorant to help. We will closely monitor, and if symptoms not improving or more problematic, needs to follow-up again, as soon as Monday as we discussed. If symptoms becoming more severe, with fevers, increased production to her cough any shortness of breath or chest pains, she needs to be seen more emergently, and can do so at an urgent care or emergency setting in that case.  She was understanding of that. 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  I discussed the assessment and treatment plan with the patient. The patient was provided an opportunity to ask questions and all were answered. The patient agreed with the plan and demonstrated an understanding of the instructions.  Red flags and when to present for emergency care or RTC including fevers, chest  pain, shortness of breath, new/worsening/un-resolving symptoms reviewed with patient at time of visit.   The patient was advised to call back or seek an in-person evaluation if the symptoms worsen or if the condition fails to improve as anticipated.  I provided 20 minutes of non-face-to-face time during this encounter that included discussing at length patient's sx/history, pertinent pmhx, medications, treatment and follow up plan. This time also included the necessary documentation, orders, and chart review.  Jamelle Haring, MD

## 2020-05-21 ENCOUNTER — Ambulatory Visit (INDEPENDENT_AMBULATORY_CARE_PROVIDER_SITE_OTHER): Payer: No Typology Code available for payment source | Admitting: Family Medicine

## 2020-05-21 ENCOUNTER — Encounter: Payer: Self-pay | Admitting: Family Medicine

## 2020-05-21 ENCOUNTER — Other Ambulatory Visit: Payer: Self-pay

## 2020-05-21 VITALS — BP 138/78 | HR 86 | Temp 98.0°F | Resp 16 | Ht 66.0 in | Wt 209.6 lb

## 2020-05-21 DIAGNOSIS — E785 Hyperlipidemia, unspecified: Secondary | ICD-10-CM

## 2020-05-21 DIAGNOSIS — E669 Obesity, unspecified: Secondary | ICD-10-CM | POA: Diagnosis not present

## 2020-05-21 DIAGNOSIS — E8881 Metabolic syndrome: Secondary | ICD-10-CM | POA: Diagnosis not present

## 2020-05-21 DIAGNOSIS — Z7689 Persons encountering health services in other specified circumstances: Secondary | ICD-10-CM | POA: Diagnosis not present

## 2020-05-21 DIAGNOSIS — I10 Essential (primary) hypertension: Secondary | ICD-10-CM | POA: Diagnosis not present

## 2020-05-21 DIAGNOSIS — D582 Other hemoglobinopathies: Secondary | ICD-10-CM

## 2020-05-21 MED ORDER — PHENTERMINE HCL 37.5 MG PO TABS: ORAL_TABLET | ORAL | 0 refills | Status: DC

## 2020-05-21 MED ORDER — SAXENDA 18 MG/3ML ~~LOC~~ SOPN: 3.0000 mg | PEN_INJECTOR | Freq: Every day | SUBCUTANEOUS | 2 refills | Status: DC

## 2020-05-21 NOTE — Progress Notes (Signed)
Name: Debra Stephens   MRN: [id number]    DOB: 1970-09-15   Date:05/21/2020       Progress Note  Chief Complaint  Patient presents with  . Follow-up     Subjective:   Debra Stephens is a 49 y.o. female, presents to clinic for weight management  Making more healthy choices and exercising and her clothes are fitting better though the scale has stayed the same - she can slide on and off pants that she recently could not even zip up  Wants to get on phentermine again, but BP has been high, it has been 130/70's at home On losartan-HCTZ 50-12.5, doing well with meds, BP Readings from Last 3 Encounters:  05/21/20 138/78  04/08/20 128/76  03/15/20 (!) 144/86     Eating more salads, protein drinks and bars, stopped pancakes and bacon in the morning and doing oatmeal and fruit.  Has a trainer at the gym who also has educational background in nutrition, so he is helping with that as well.    Current Outpatient Medications:  .  acetaminophen (TYLENOL) 325 MG tablet, Take 650 mg by mouth as needed., Disp: , Rfl:  .  fexofenadine (ALLEGRA) 180 MG tablet, Take 180 mg by mouth daily., Disp: , Rfl:  .  losartan-hydrochlorothiazide (HYZAAR) 50-12.5 MG tablet, Take 1 tablet by mouth every morning., Disp: 90 tablet, Rfl: 3 .  Multiple Vitamin (MULTIVITAMIN WITH MINERALS) TABS tablet, Take 1 tablet by mouth daily., Disp: , Rfl:  .  triamcinolone (NASACORT) 55 MCG/ACT AERO nasal inhaler, Place 2 sprays into the nose daily., Disp: , Rfl:  .  Vitamin D, Ergocalciferol, (DRISDOL) 1.25 MG (50000 UNIT) CAPS capsule, Take 1 capsule (50,000 Units total) by mouth every 7 (seven) days., Disp: 4 capsule, Rfl: 2  Patient Active Problem List   Diagnosis Date Noted  . 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 Hypertension 04/22/2020  . Vitamin B12 deficiency 01/29/2018  . Recurrent sinusitis 12/14/2017  . Dysmetabolic syndrome 11/08/2017  . Chronic idiopathic constipation 08/13/2017  . Obesity  (BMI 30-39.9) 07/27/2017  . 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 Vitamin D deficiency 11/22/2016  . ANA positive 05/24/2016  . H/O urticaria 09/08/2015  . Angio-edema 09/08/2015    Past Surgical History:  Procedure Laterality Date  . BREAST EXCISIONAL BIOPSY Left    age 34  . COLONOSCOPY WITH PROPOFOL N/A 11/02/2017   Procedure: COLONOSCOPY WITH PROPOFOL;  Surgeon: Pasty Spillers, MD;  Location: [facility] ENDOSCOPY;  Service: Endoscopy;  Laterality: N/A;  . TUBAL LIGATION    . VAGINAL HYSTERECTOMY      Family History  Problem Relation Age of Onset  . Stroke Mother   . Hypertension Mother   . Heart murmur Mother   . Hypertension Father   . Breast cancer Maternal Aunt   . Ovarian cancer Maternal Aunt   . Lupus Sister   . Lupus Daughter   . Liver cancer Maternal Grandfather   . Cirrhosis Maternal Grandfather   . Pancreatic cancer Maternal Grandfather   . Hypertension Paternal Grandmother   . Sickle cell anemia Sister   . Thyroid cancer Sister   . Seizures Sister   . Hashimoto's thyroiditis Sister   . Fibromyalgia Sister     Social History   Tobacco Use  . Smoking status: Never Smoker  . Smokeless tobacco: Never Used  Vaping Use  . Vaping Use: Never used  Substance Use Topics  . Alcohol use: No  . Drug use:  No     Allergies  Allergen Reactions  . Erythromycin Other (See Comments)  . Ibuprofen Hives  . Vibramycin [Doxycycline Calcium] Hives    Health Maintenance  Topic Date Due  . Hepatitis C Screening  Never done  . TETANUS/TDAP  01/30/2028  . INFLUENZA VACCINE  Completed  . COVID-19 Vaccine  Completed  . HIV Screening  Completed  . PAP SMEAR-Modifier  Discontinued    Chart Review Today: I personally reviewed active problem list, medication list, allergies, family history, social history, health maintenance, notes from last encounter, lab results, imaging with the patient/caregiver today.   Review of Systems  10 Systems reviewed and are  negative for acute change except as noted in the HPI.  Objective:   Vitals:   05/21/20 0814  Pulse: 86  Resp: 16  Temp: 98 F (36.7 C)  TempSrc: Oral  SpO2: 99%  Weight: 209 lb 9.6 oz (95.1 kg)  Height: 5\' 6"  (1.676 m)    Body mass index is 33.83 kg/m.  Physical Exam Vitals and nursing note reviewed.  Constitutional:      General: She is not in acute distress.    Appearance: Normal appearance. She is well-developed. She is obese. She is not ill-appearing, toxic-appearing or diaphoretic.     Interventions: Face mask in place.  HENT:     Head: Normocephalic and atraumatic.     Right Ear: External ear normal.     Left Ear: External ear normal.  Eyes:     General: Lids are normal. No scleral icterus.       Right eye: No discharge.        Left eye: No discharge.     Conjunctiva/sclera: Conjunctivae normal.  Neck:     Trachea: Phonation normal. No tracheal deviation.  Cardiovascular:     Rate and Rhythm: Normal rate and regular rhythm.     Pulses: Normal pulses.          Radial pulses are 2+ on the right side and 2+ on the left side.       Posterior tibial pulses are 2+ on the right side and 2+ on the left side.     Heart sounds: Normal heart sounds. No murmur heard. No friction rub. No gallop.   Pulmonary:     Effort: Pulmonary effort is normal. No respiratory distress.     Breath sounds: Normal breath sounds. No stridor. No wheezing, rhonchi or rales.  Chest:     Chest wall: No tenderness.  Abdominal:     General: Bowel sounds are normal. There is no distension.     Palpations: Abdomen is soft.  Musculoskeletal:     Right lower leg: No edema.     Left lower leg: No edema.  Skin:    General: Skin is warm and dry.     Coloration: Skin is not jaundiced or pale.     Findings: No rash.  Neurological:     Mental Status: She is alert.     Motor: No abnormal muscle tone.     Gait: Gait normal.  Psychiatric:        Mood and Affect: Mood normal.        Speech: Speech  normal.        Behavior: Behavior normal.         Assessment & Plan:     ICD-10-CM   1. Obesity (BMI 30-39.9)  E66.9 phentermine (ADIPEX-P) 37.5 MG tablet    DISCONTINUED: Liraglutide -Weight Management (SAXENDA)  18 MG/3ML SOPN   weight the same, but lost inches, clothes fitting better, improved diet efforts and exercising  2. Dysmetabolic syndrome  E88.81 DISCONTINUED: Liraglutide -Weight Management (SAXENDA) 18 MG/3ML SOPN   would still like to try meds to help with weight loss and metabolism  3. Encounter for weight management  Z76.89 phentermine (ADIPEX-P) 37.5 MG tablet    DISCONTINUED: Liraglutide -Weight Management (SAXENDA) 18 MG/3ML SOPN   see above, discussed options, if BP stays 130's/70-80's ok to try phentermine 1/2 tab, if BP increases pt understands to hold, discussed risk vs benefit and SE  4. Hypertension, unspecified type  I10    Has been much better controlled, doing well with meds w/o side effects, diet and exercise efforts helping, continue meds and DASH  5. Hyperlipidemia, unspecified hyperlipidemia type  E78.5    LDL high, will recheck next year and see how diet/lifestyle efforts have improved it  6. 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  Return for 1 month f/up BP and weight check.   Danelle Berry, PA-C 05/21/20 8:21 AM

## 2020-06-02 MED FILL — VIT D2 1.25 MG (50,000 UNIT: 1.25 MG | 28 days supply | Qty: 4 | Fill #1

## 2020-06-02 MED FILL — LOSARTAN-HCTZ 50-12.5 MG TA: 50-12.5 | 30 days supply | Qty: 30 | Fill #1

## 2020-06-10 ENCOUNTER — Ambulatory Visit
Admission: EM | Admit: 2020-06-10 | Discharge: 2020-06-10 | Disposition: A | Discharge status: Home / Self Care | Payer: No Typology Code available for payment source | Attending: Physician Assistant | Admitting: Physician Assistant

## 2020-06-10 ENCOUNTER — Encounter: Payer: Self-pay | Admitting: Emergency Medicine

## 2020-06-10 ENCOUNTER — Other Ambulatory Visit: Payer: Self-pay

## 2020-06-10 ENCOUNTER — Ambulatory Visit: Payer: Self-pay

## 2020-06-10 DIAGNOSIS — Z20822 Contact with and (suspected) exposure to covid-19: Secondary | ICD-10-CM | POA: Diagnosis present

## 2020-06-10 DIAGNOSIS — J069 Acute upper respiratory infection, unspecified: Secondary | ICD-10-CM

## 2020-06-10 LAB — GROUP A STREP BY PCR: Group A Strep by PCR: NOT DETECTED

## 2020-06-10 LAB — CBC WITH DIFFERENTIAL/PLATELET
Abs Immature Granulocytes: 0.01 10*3/uL (ref 0.00–0.07)
Basophils Absolute: 0 10*3/uL (ref 0.0–0.1)
Basophils Relative: 1 %
Eosinophils Absolute: 0 10*3/uL (ref 0.0–0.5)
Eosinophils Relative: 0 %
HCT: 37.3 % (ref 36.0–46.0)
Hemoglobin: 12.1 g/dL (ref 12.0–15.0)
Immature Granulocytes: 0 %
Lymphocytes Relative: 33 %
Lymphs Abs: 1.2 10*3/uL (ref 0.7–4.0)
MCH: 23.3 pg — ABNORMAL LOW (ref 26.0–34.0)
MCHC: 32.4 g/dL (ref 30.0–36.0)
MCV: 71.7 fL — ABNORMAL LOW (ref 80.0–100.0)
Monocytes Absolute: 0.7 10*3/uL (ref 0.1–1.0)
Monocytes Relative: 19 %
Neutro Abs: 1.7 10*3/uL (ref 1.7–7.7)
Neutrophils Relative %: 47 %
Platelets: 184 10*3/uL (ref 150–400)
RBC: 5.2 MIL/uL — ABNORMAL HIGH (ref 3.87–5.11)
RDW: 14.8 % (ref 11.5–15.5)
WBC: 3.7 10*3/uL — ABNORMAL LOW (ref 4.0–10.5)
nRBC: 0 % (ref 0.0–0.2)

## 2020-06-10 MED ORDER — FLUTICASONE PROPIONATE 50 MCG/ACT NA SUSP: 2.0000 | Freq: Every day | NASAL | 0 refills | Status: DC

## 2020-06-10 MED ORDER — BENZONATATE 200 MG PO CAPS: 200.0000 mg | ORAL_CAPSULE | Freq: Two times a day (BID) | ORAL | 0 refills | Status: DC | PRN

## 2020-06-10 NOTE — Discharge Instructions (Signed)
If your Covid test ends up positive you may take the following supplements to help your immune system be stronger to fight this viral infection Take Quarcetin 500 mg three times a day x 7 days with Zinc 50 mg ones a day x 7 days. The quarcetin is an antiviral and anti-inflammatory supplement which helps open the zinc channels in the cell to absorb Zinc. Zinc helps decrease the virus load in your body. Take Melatonin 6-10 mg at bed time which also helps support your immune system.  Also make sure to take Vit C 5000 mg a day until you are completely better. Stay on Vitamin D 2,000  and C the rest of the season.  Don't lay around, keep active and walk as much as you are able to to prevent worsening of your symptoms.  Follow up with your family Dr next week.  If you get short of breath and you are able to check  your oxygen with a pulse oxygen meter, if it gets to 92% or less, you need to go to the hospital to be admitted. If you dont have one, come back here and we will assess you.

## 2020-06-10 NOTE — ED Triage Notes (Addendum)
Patient in today c/o cough and nasal congestion x 4 days, sore throat x 3 days and fever (100.2) x 1 day. Patient has taken OTC Tylenol cough/flu, Sudafed and Mucinex. Patient has had the covid vaccines.   Patient had a covid test at [facility] yesterday and results are pending. Patient declines covid testing today.

## 2020-06-10 NOTE — ED Provider Notes (Signed)
[facility]    CSN: [id number] Arrival date & time: 06/10/20  8588      History   Chief Complaint Chief Complaint  Patient presents with  . Cough  . Fever  . Nasal Congestion  . Sore Throat    HPI Debra Stephens is a 49 y.o. female who presents due to having onset of cough and nose congestion, ST x 4 days. Onset of fever yesterday up to 100.2 Had covid test yesterday at [facility], results are still pending. She gets frequent URI's with fevers.    Past Medical History:  Diagnosis Date  . Abdominal tenderness, right upper quadrant   . Abnormal Pap smear of cervix   . Allergic rhinitis, cause unspecified   . Anemia   . Atypical chest pain 06/24/2013  . Chronic headaches   . Cough   . Family history of breast cancer   . Family history of pancreatic cancer    9/21 cancer genetic testing letter sent  . Lumbago   . Other and unspecified hyperlipidemia   . Swelling   . Thoracic or lumbosacral neuritis or radiculitis, unspecified   . Unspecified symptom associated with female genital organs   . Unspecified vitamin D deficiency     Patient Active Problem List   Diagnosis Date Noted  . 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 Hypertension 04/22/2020  . Vitamin B12 deficiency 01/29/2018  . Recurrent sinusitis 12/14/2017  . Dysmetabolic syndrome 11/08/2017  . Chronic idiopathic constipation 08/13/2017  . Obesity (BMI 30-39.9) 07/27/2017  . 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 Vitamin D deficiency 11/22/2016  . ANA positive 05/24/2016  . H/O urticaria 09/08/2015  . Angio-edema 09/08/2015    Past Surgical History:  Procedure Laterality Date  . BREAST EXCISIONAL BIOPSY Left    age 31  . COLONOSCOPY WITH PROPOFOL N/A 11/02/2017   Procedure: COLONOSCOPY WITH PROPOFOL;  Surgeon: Pasty Spillers, MD;  Location: [facility] END[facility];  Service: Endoscopy;  Laterality: N/A;  . TUBAL LIGATION    . VAGINAL HYSTERECTOMY      OB History   No  obstetric history on file.      Home Medications    Prior to Admission medications   Medication Sig Start Date End Date Taking? Authorizing Provider  acetaminophen (TYLENOL) 325 MG tablet Take 650 mg by mouth as needed.   Yes [provider]  fexofenadine (ALLEGRA) 180 MG tablet Take 180 mg by mouth daily.   Yes [provider]  losartan-hydrochlorothiazide (HYZAAR) 50-12.5 MG tablet Take 1 tablet by mouth every morning. 03/31/20  Yes Danelle Berry, PA-C  Multiple Vitamin (MULTIVITAMIN WITH MINERALS) TABS tablet Take 1 tablet by mouth daily.   Yes [provider]  triamcinolone (NASACORT) 55 MCG/ACT AERO nasal inhaler Place 2 sprays into the nose daily.   Yes [provider]  Vitamin D, Ergocalciferol, (DRISDOL) 1.25 MG (50000 UNIT) CAPS capsule Take 1 capsule (50,000 Units total) by mouth every 7 (seven) days. 03/10/20  Yes Nadara Mustard, MD  phentermine (ADIPEX-P) 37.5 MG tablet Take 1/2 tab po with breakfast daily, hold if BP >140/80 05/21/20   Danelle Berry, PA-C    Family History Family History  Problem Relation Age of Onset  . Stroke Mother   . Hypertension Mother   . Heart murmur Mother   . Hypertension Father   . Breast cancer Maternal Aunt   . Ovarian cancer Maternal Aunt   . Lupus Sister   . Lupus Daughter   .  Liver cancer Maternal Grandfather   . Cirrhosis Maternal Grandfather   . Pancreatic cancer Maternal Grandfather   . Hypertension Paternal Grandmother   . Sickle cell anemia Sister   . Thyroid cancer Sister   . Seizures Sister   . Hashimoto's thyroiditis Sister   . Fibromyalgia Sister     Social History Social History   Tobacco Use  . Smoking status: Never Smoker  . Smokeless tobacco: Never Used  Vaping Use  . Vaping Use: Never used  Substance Use Topics  . Alcohol use: No  . Drug use: No     Allergies   Erythromycin, Ibuprofen, and Vibramycin [doxycycline calcium]   Review of Systems Review of Systems   Constitutional: Positive for activity change, appetite change, chills, diaphoresis, fatigue and fever.  HENT: Positive for congestion, postnasal drip, sinus pressure, sinus pain and sore throat. Negative for ear discharge, ear pain and trouble swallowing.   Eyes: Negative for discharge.  Respiratory: Positive for cough. Negative for chest tightness and shortness of breath.   Cardiovascular: Negative for chest pain.  Gastrointestinal: Positive for abdominal pain and nausea. Negative for diarrhea and vomiting.  Genitourinary: Negative for difficulty urinating.  Musculoskeletal: Positive for myalgias.  Skin: Negative for rash.  Neurological: Positive for headaches.  Hematological: Negative for adenopathy.     Physical Exam Triage Vital Signs ED Triage Vitals  Enc Vitals Group     BP 06/10/20 0856 136/76     Pulse Rate 06/10/20 0856 100     Resp 06/10/20 0856 18     Temp 06/10/20 0856 99.2 F (37.3 C)     Temp Source 06/10/20 0856 Oral     SpO2 06/10/20 0856 98 %     Weight 06/10/20 0857 198 lb (89.8 kg)     Height 06/10/20 0857 5\' 6"  (1.676 m)     Head Circumference --      Peak Flow --      Pain Score 06/10/20 0856 8     Pain Loc --      Pain Edu? --      Excl. in GC? --    No data found.  Updated Vital Signs BP 136/76 (BP Location: Left Arm)   Pulse 100   Temp 99.2 F (37.3 C) (Oral)   Resp 18   Ht 5\' 6"  (1.676 m)   Wt 198 lb (89.8 kg)   SpO2 98%   BMI 31.96 kg/m   Visual Acuity Right Eye Distance:   Left Eye Distance:   Bilateral Distance:    Right Eye Near:   Left Eye Near:    Bilateral Near:     Physical Exam Physical Exam Vitals signs and nursing note reviewed.  Constitutional:      General: She is not in acute distress and looks like she does not feel well, was laying down under a blanket in the room.    Appearance: Normal appearance. She is not toxic-appearing or diaphoretic.  HENT:     Head: Normocephalic.     Right Ear: Tympanic membrane, ear  canal and external ear normal.     Left Ear: Tympanic membrane, ear canal and external ear normal.     Nose: Nose normal.     Mouth/Throat:     Mouth: Mucous membranes are moist.  Eyes:     General: No scleral icterus.       Right eye: No discharge.        Left eye: No discharge.  Conjunctiva/sclera: Conjunctivae normal.  Neck:     Musculoskeletal: Neck supple. No neck rigidity.  Cardiovascular:     Rate and Rhythm: Normal rate and regular rhythm.     Heart sounds: No murmur.  Pulmonary:     Effort: Pulmonary effort is normal.     Breath sounds: Normal breath sounds.  Abdominal:     General: Bowel sounds are normal. There is no distension.     Palpations: Abdomen is soft. There is no mass.     Tenderness: has generalized tenderness. There is no guarding or rebound.     Hernia: No hernia is present.  Musculoskeletal: Normal range of motion.  Lymphadenopathy:     Cervical: No cervical adenopathy.  Skin:    General: Skin is warm and dry.     Coloration: Skin is not jaundiced.     Findings: No rash.  Neurological:     Mental Status: She is alert and oriented to person, place, and time.     Gait: Gait normal.  Psychiatric:        Mood and Affect: Mood normal.        Behavior: Behavior normal.        Thought Content: Thought content normal.        Judgment: Judgment normal.     UC Treatments / Results  Labs (all labs ordered are listed, but only abnormal results are displayed) Labs Reviewed  GROUP A STREP BY PCR  Strep neg. CBC stable compared to last one with no left shift  EKG   Radiology No results found.  Procedures Procedures (including critical care time)  Medications Ordered in UC Medications - No data to display  Initial Impression / Assessment and Plan / UC Course  I have reviewed the triage vital signs and the nursing notes. I ordered a CBC with diff to confirm she has a viral infection since she was asking for antibiotics which she did not need.   Pertinent labs  results that were available during my care of the patient were reviewed by me and considered in my medical decision making (see chart for details). I prescribed her Tessalon and Flonase. I showed her a video how to do saline nose rinses with Netie Pot.    Final Clinical Impressions(s) / UC Diagnoses   Final diagnoses:  None   Discharge Instructions   None    ED Prescriptions    None     PDMP not reviewed this encounter.   Garey Ham, [place] 06/11/20 4650

## 2020-06-25 ENCOUNTER — Ambulatory Visit
Admission: RE | Admit: 2020-06-25 | Discharge: 2020-06-25 | Disposition: A | Discharge status: Home / Self Care | Payer: No Typology Code available for payment source | Attending: Family Medicine | Admitting: Family Medicine

## 2020-06-25 ENCOUNTER — Encounter: Payer: Self-pay | Admitting: Family Medicine

## 2020-06-25 ENCOUNTER — Ambulatory Visit
Admission: RE | Admit: 2020-06-25 | Discharge: 2020-06-25 | Disposition: A | Discharge status: Home / Self Care | Payer: No Typology Code available for payment source | Source: Ambulatory Visit | Attending: Family Medicine | Admitting: Family Medicine

## 2020-06-25 ENCOUNTER — Ambulatory Visit (INDEPENDENT_AMBULATORY_CARE_PROVIDER_SITE_OTHER): Payer: No Typology Code available for payment source | Admitting: Family Medicine

## 2020-06-25 ENCOUNTER — Other Ambulatory Visit: Payer: Self-pay

## 2020-06-25 VITALS — BP 126/68 | HR 92 | Temp 98.4°F | Resp 16 | Ht 66.0 in | Wt 202.5 lb

## 2020-06-25 DIAGNOSIS — U071 COVID-19: Secondary | ICD-10-CM | POA: Diagnosis present

## 2020-06-25 DIAGNOSIS — E8881 Metabolic syndrome: Secondary | ICD-10-CM

## 2020-06-25 DIAGNOSIS — E669 Obesity, unspecified: Secondary | ICD-10-CM

## 2020-06-25 DIAGNOSIS — I10 Essential (primary) hypertension: Secondary | ICD-10-CM

## 2020-06-25 DIAGNOSIS — L989 Disorder of the skin and subcutaneous tissue, unspecified: Secondary | ICD-10-CM

## 2020-06-25 DIAGNOSIS — R059 Cough, unspecified: Secondary | ICD-10-CM

## 2020-06-25 DIAGNOSIS — E785 Hyperlipidemia, unspecified: Secondary | ICD-10-CM | POA: Diagnosis not present

## 2020-06-25 DIAGNOSIS — E559 Vitamin D deficiency, unspecified: Secondary | ICD-10-CM

## 2020-06-25 NOTE — Progress Notes (Signed)
Name: Debra Stephens   MRN: [id number]    DOB: 11-May-1971   Date:06/25/2020       Progress Note  Chief Complaint  Patient presents with  . Hypertension    1 month BP check  . Obesity     Subjective:   Debra Stephens is a 50 y.o. female, presents to clinic for weight management and BP f/up  Borderline BP and hesitation to retake phentermine due to elevated BP - here to monitor and recheck:  Weight check: Drinking more water and eating les Wt Readings from Last 5 Encounters:  06/25/20 202 lb 8 oz (91.9 kg)  06/10/20 198 lb (89.8 kg)  05/21/20 209 lb 9.6 oz (95.1 kg)  04/08/20 206 lb 4.8 oz (93.6 kg)  03/15/20 204 lb 14.4 oz (92.9 kg)   BMI Readings from Last 5 Encounters:  06/25/20 32.68 kg/m  06/10/20 31.96 kg/m  05/21/20 33.83 kg/m  04/08/20 33.30 kg/m  03/15/20 33.07 kg/m   Making more healthy choices and exercising and her clothes are fitting better though the scale has stayed the same - she can slide on and off pants that she recently could not even zip up Eating more salads, protein drinks and bars, stopped pancakes and bacon in the morning and doing oatmeal and fruit. Has a trainer at the gym who also has educational background in nutrition, so he is helping with that as well.  Wants to get on phentermine again, but BP has been high, it has been 130/70's at home On losartan-HCTZ 50-12.5, doing well with meds, no SE or concerns BP better today than last couple office visits  BP Readings from Last 3 Encounters:  06/25/20 126/68  06/10/20 136/76  05/21/20 138/78     Also several acute complaints at end of visit and request for referrals: COVID + and returned to work  Appetite was down Recovered well and has been back to work  Is worried about her lungs/lingering cough- Cough, hurts on and off, had pneumonia years ago, no asthma Left central chest Occasionally phlegm, thick mucuous, not as thick as it was previously  Still having night sweats, no  fever, some postnasal drip  Here also for f/up on Vit D deficiency, and hx of low calcium Was taking rx high dose vit d supplement and is due for labs to be rechecked  HLD - cholesterol high in sept - working on diet/lifestyle, not on meds  She is concerns with lesion to left upper arm - started as a pin point flat lesion that was hypopigmented and now is about 2-3 mm in diameter - she would like to see a dermatologist to have them look at it.    Current Outpatient Medications:  .  acetaminophen (TYLENOL) 325 MG tablet, Take 650 mg by mouth as needed., Disp: , Rfl:  .  benzonatate (TESSALON) 200 MG capsule, Take 1 capsule (200 mg total) by mouth 2 (two) times daily as needed for cough., Disp: 30 capsule, Rfl: 0 .  fexofenadine (ALLEGRA) 180 MG tablet, Take 180 mg by mouth daily., Disp: , Rfl:  .  fluticasone (FLONASE) 50 MCG/ACT nasal spray, Place 2 sprays into both nostrils daily., Disp: 18.5 g, Rfl: 0 .  losartan-hydrochlorothiazide (HYZAAR) 50-12.5 MG tablet, Take 1 tablet by mouth every morning., Disp: 90 tablet, Rfl: 3 .  Multiple Vitamin (MULTIVITAMIN WITH MINERALS) TABS tablet, Take 1 tablet by mouth daily., Disp: , Rfl:  .  triamcinolone (NASACORT) 55 MCG/ACT AERO nasal inhaler, Place  2 sprays into the nose daily., Disp: , Rfl:  .  Vitamin D, Ergocalciferol, (DRISDOL) 1.25 MG (50000 UNIT) CAPS capsule, Take 1 capsule (50,000 Units total) by mouth every 7 (seven) days., Disp: 4 capsule, Rfl: 2  Patient Active Problem List   Diagnosis Date Noted  . 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 Hypertension 04/22/2020  . Vitamin B12 deficiency 01/29/2018  . Recurrent sinusitis 12/14/2017  . Dysmetabolic syndrome 11/08/2017  . Chronic idiopathic constipation 08/13/2017  . Obesity (BMI 30-39.9) 07/27/2017  . 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 Vitamin D deficiency 11/22/2016  . ANA positive 05/24/2016  . H/O urticaria 09/08/2015  . Angio-edema 09/08/2015    Past Surgical  History:  Procedure Laterality Date  . BREAST EXCISIONAL BIOPSY Left    age 28  . COLONOSCOPY WITH PROPOFOL N/A 11/02/2017   Procedure: COLONOSCOPY WITH PROPOFOL;  Surgeon: Pasty Spillers, MD;  Location: [facility];  Service: Endoscopy;  Laterality: N/A;  . TUBAL LIGATION    . VAGINAL HYSTERECTOMY      Family History  Problem Relation Age of Onset  . Stroke Mother   . Hypertension Mother   . Heart murmur Mother   . Hypertension Father   . Breast cancer Maternal Aunt   . Ovarian cancer Maternal Aunt   . Lupus Sister   . Lupus Daughter   . Liver cancer Maternal Grandfather   . Cirrhosis Maternal Grandfather   . Pancreatic cancer Maternal Grandfather   . Hypertension Paternal Grandmother   . Sickle cell anemia Sister   . Thyroid cancer Sister   . Seizures Sister   . Hashimoto's thyroiditis Sister   . Fibromyalgia Sister     Social History   Tobacco Use  . Smoking status: Never Smoker  . Smokeless tobacco: Never Used  Vaping Use  . Vaping Use: Never used  Substance Use Topics  . Alcohol use: No  . Drug use: No     Allergies  Allergen Reactions  . Erythromycin Other (See Comments)  . Ibuprofen Hives  . Vibramycin [Doxycycline Calcium] Hives    Health Maintenance  Topic Date Due  . Hepatitis C Screening  Never done  . COVID-19 Vaccine (3 - Booster for Pfizer series) 07/11/2020 (Originally 03/21/2020)  . COLONOSCOPY (Pts 45-22yrs Insurance coverage will need to be confirmed)  11/03/2027  . TETANUS/TDAP  01/30/2028  . INFLUENZA VACCINE  Completed  . HIV Screening  Completed  . PAP SMEAR-Modifier  Discontinued    Chart Review Today: I personally reviewed active problem list, medication list, allergies, family history, social history, health maintenance, notes from last encounter, lab results, imaging with the patient/caregiver today.   Review of Systems  Constitutional: Negative.   HENT: Negative.   Eyes: Negative.   Respiratory: Negative.    Cardiovascular: Negative.   Gastrointestinal: Negative.   Endocrine: Negative.   Genitourinary: Negative.   Musculoskeletal: Negative.   Skin: Negative.   Allergic/Immunologic: Negative.   Neurological: Negative.   Hematological: Negative.   Psychiatric/Behavioral: Negative.   All other systems reviewed and are negative.   10 Systems reviewed and are negative for acute change except as noted in the HPI.  Objective:   Vitals:   06/25/20 1440  BP: 126/68  Pulse: 92  Resp: 16  Temp: 98.4 F (36.9 C)  TempSrc: Oral  SpO2: 96%  Weight: 202 lb 8 oz (91.9 kg)  Height: 5\' 6"  (1.676 m)    Body mass index is 32.68 kg/m.  Physical Exam Vitals  and nursing note reviewed.  Constitutional:      General: She is not in acute distress.    Appearance: Normal appearance. She is well-developed. She is obese. She is not ill-appearing, toxic-appearing or diaphoretic.     Interventions: Face mask in place.  HENT:     Head: Normocephalic and atraumatic.     Right Ear: External ear normal.     Left Ear: External ear normal.  Eyes:     General: Lids are normal. No scleral icterus.       Right eye: No discharge.        Left eye: No discharge.     Conjunctiva/sclera: Conjunctivae normal.  Neck:     Trachea: Phonation normal. No tracheal deviation.  Cardiovascular:     Rate and Rhythm: Normal rate and regular rhythm.     Pulses: Normal pulses.          Radial pulses are 2+ on the right side and 2+ on the left side.       Posterior tibial pulses are 2+ on the right side and 2+ on the left side.     Heart sounds: Normal heart sounds. No murmur heard. No friction rub. No gallop.   Pulmonary:     Effort: Pulmonary effort is normal. No respiratory distress.     Breath sounds: Normal breath sounds. No stridor. No wheezing, rhonchi or rales.  Chest:     Chest wall: No tenderness.  Abdominal:     General: Bowel sounds are normal. There is no distension.     Palpations: Abdomen is soft.   Musculoskeletal:     Right lower leg: No edema.     Left lower leg: No edema.  Skin:    General: Skin is warm and dry.     Coloration: Skin is not jaundiced or pale.     Findings: Lesion present. No rash.     Comments: Hypopigmented lesion roughly 3-millimeters in diameter to left upper arm  Neurological:     Mental Status: She is alert.     Motor: No abnormal muscle tone.     Gait: Gait normal.  Psychiatric:        Mood and Affect: Mood normal.        Speech: Speech normal.        Behavior: Behavior normal.         Assessment & Plan:     ICD-10-CM   1. Dysmetabolic syndrome  E88.81 COMPLETE METABOLIC PANEL WITH GFR   working on diet/exercise  2. Obesity (BMI 30-39.9)  E66.9    weight recheck - doing well, pt feels good, has plan going forward to continue efforts  3. Hypertension, unspecified type  I10 COMPLETE METABOLIC PANEL WITH GFR   doing well with meds and diet/lifestyle efforts, BP well controlled today - continue losartan-HCTZ 50-12.5  4. Hyperlipidemia, unspecified hyperlipidemia type  E78.5 COMPLETE METABOLIC PANEL WITH GFR   working on diet/exercise - wants to wait a few more months before rechecking labs  5. Cough  R05.9 DG Chest 2 View   lingering, on and off pain, no fever, no SOB  6. COVID-19 virus infection  U07.1 DG Chest 2 View   more than 2 weeks ago, some improving residual sx  7. Vitamin D deficiency  E55.9 COMPLETE METABOLIC PANEL WITH GFR    VITAMIN D 25 Hydroxy (Vit-D Deficiency, Fractures)   recheck deficiency after pt has been taking high dose Rx x 3-4 months  8. Skin lesion  of left arm  L98.9 Ambulatory referral to Dermatology   Hypopigmented lesion roughly 3-millimeters in diameter to left upper arm, started as a small pinpoint spot and has enlarged, she has dermatology follow-up    F/up in 3-4 months for HLD, HTN, weight    Danelle BerryLeisa Lakendria Nicastro, PA-C 06/25/20 2:50 PM

## 2020-06-26 LAB — COMPLETE METABOLIC PANEL WITH GFR
AG Ratio: 1.4 (calc) (ref 1.0–2.5)
ALT: 17 U/L (ref 6–29)
AST: 21 U/L (ref 10–35)
Albumin: 4 g/dL (ref 3.6–5.1)
Alkaline phosphatase (APISO): 46 U/L (ref 31–125)
BUN: 11 mg/dL (ref 7–25)
CO2: 29 mmol/L (ref 20–32)
Calcium: 9.6 mg/dL (ref 8.6–10.2)
Chloride: 105 mmol/L (ref 98–110)
Creat: 0.98 mg/dL (ref 0.50–1.10)
GFR, Est African American: 79 mL/min/{1.73_m2} (ref >=60)
GFR, Est Non African American: 68 mL/min/{1.73_m2} (ref >=60)
Globulin: 2.9 g/dL (calc) (ref 1.9–3.7)
Glucose, Bld: 84 mg/dL (ref 65–99)
Potassium: 4.4 mmol/L (ref 3.5–5.3)
Sodium: 141 mmol/L (ref 135–146)
Total Bilirubin: 0.9 mg/dL (ref 0.2–1.2)
Total Protein: 6.9 g/dL (ref 6.1–8.1)

## 2020-06-26 LAB — VITAMIN D 25 HYDROXY (VIT D DEFICIENCY, FRACTURES): Vit D, 25-Hydroxy: 85 ng/mL (ref 30–100)

## 2020-06-30 ENCOUNTER — Other Ambulatory Visit: Payer: Self-pay | Admitting: Obstetrics & Gynecology

## 2020-06-30 MED FILL — LOSARTAN-HCTZ 50-12.5 MG TA: 50-12.5 | 30 days supply | Qty: 30 | Fill #2

## 2020-07-08 ENCOUNTER — Encounter: Payer: Self-pay | Admitting: Family Medicine

## 2020-07-16 ENCOUNTER — Other Ambulatory Visit: Payer: Self-pay | Admitting: Obstetrics & Gynecology

## 2020-07-16 MED FILL — LOSARTAN-HCTZ 50-12.5 MG TA: 50-12.5 | 30 days supply | Qty: 30 | Fill #2

## 2020-07-17 ENCOUNTER — Other Ambulatory Visit: Payer: Self-pay

## 2020-07-17 ENCOUNTER — Encounter: Payer: Self-pay | Admitting: Emergency Medicine

## 2020-07-17 ENCOUNTER — Ambulatory Visit
Admission: EM | Admit: 2020-07-17 | Discharge: 2020-07-17 | Disposition: A | Discharge status: Home / Self Care | Payer: No Typology Code available for payment source | Attending: Sports Medicine | Admitting: Sports Medicine

## 2020-07-17 DIAGNOSIS — T781XXA Other adverse food reactions, not elsewhere classified, initial encounter: Secondary | ICD-10-CM

## 2020-07-17 DIAGNOSIS — L509 Urticaria, unspecified: Secondary | ICD-10-CM | POA: Diagnosis not present

## 2020-07-17 MED ORDER — METHYLPREDNISOLONE SODIUM SUCC 125 MG IJ SOLR
125.0000 mg | Freq: Once | INTRAMUSCULAR | Status: AC
  Administered 2020-07-17: 125 mg via INTRAMUSCULAR

## 2020-07-17 MED ORDER — PREDNISONE 10 MG (21) PO TBPK: ORAL_TABLET | Freq: Every day | ORAL | 0 refills | Status: DC

## 2020-07-17 NOTE — ED Provider Notes (Signed)
[facility]    CSN: [id number] Arrival date & time: 07/17/20  1125      History   Chief Complaint Chief Complaint  Patient presents with  . Allergic Reaction    HPI Debra Stephens is a 50 y.o. female.   Pleasant 50 year old female who presents for evaluation of the above issues.  She said she was at a [employer] on 07/15/2020 and was eating a salad and a salad dressing was quite spicy.  It had ginger in it.  Her tongue tingles at the time.  It did not swell.  She said that she got some hives in her neck area.  No anaphylaxis or narrowing of her throat.  No difficulty breathing.  She took Benadryl which has helped the hives.  Yesterday she started to develop a little bit of tingling in her throat but again no difficulty breathing or shortness of breath.  No anaphylaxis.  She does have an atopic history but does not have an EpiPen currently.  Given the fact that she is still symptomatic she comes in today for initial evaluation.  No red flag signs or symptoms elicited on history.  No chest pain or shortness of breath.     Past Medical History:  Diagnosis Date  . Abdominal tenderness, right upper quadrant   . Abnormal Pap smear of cervix   . Allergic rhinitis, cause unspecified   . Anemia   . Atypical chest pain 06/24/2013  . Chronic headaches   . Cough   . Family history of breast cancer   . Family history of pancreatic cancer    9/21 cancer genetic testing letter sent  . Lumbago   . Other and unspecified hyperlipidemia   . Swelling   . Thoracic or lumbosacral neuritis or radiculitis, unspecified   . Unspecified symptom associated with female genital organs   . Unspecified vitamin D deficiency     Patient Active Problem List   Diagnosis Date Noted  . 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 Hypertension 04/22/2020  . Vitamin B12 deficiency 01/29/2018  . Recurrent sinusitis 12/14/2017  . Dysmetabolic syndrome 11/08/2017  . Chronic idiopathic  constipation 08/13/2017  . Obesity (BMI 30-39.9) 07/27/2017  . 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 Vitamin D deficiency 11/22/2016  . ANA positive 05/24/2016  . H/O urticaria 09/08/2015  . Angio-edema 09/08/2015    Past Surgical History:  Procedure Laterality Date  . BREAST EXCISIONAL BIOPSY Left    age 42  . COLONOSCOPY WITH PROPOFOL N/A 11/02/2017   Procedure: COLONOSCOPY WITH PROPOFOL;  Surgeon: Pasty Spillers, MD;  Location: [facility] ENDOSCOPY;  Service: Endoscopy;  Laterality: N/A;  . TUBAL LIGATION    . VAGINAL HYSTERECTOMY      OB History   No obstetric history on file.      Home Medications    Prior to Admission medications   Medication Sig Start Date End Date Taking? Authorizing Provider  losartan-hydrochlorothiazide (HYZAAR) 50-12.5 MG tablet Take 1 tablet by mouth every morning. 03/31/20  Yes Danelle Berry, PA-C  Multiple Vitamin (MULTIVITAMIN WITH MINERALS) TABS tablet Take 1 tablet by mouth daily.   Yes [provider]  predniSONE (STERAPRED UNI-PAK 21 TAB) 10 MG (21) TBPK tablet Take by mouth daily. Take 6 tabs by mouth daily  for 2 days, then 5 tabs for 2 days, then 4 tabs for 2 days, then 3 tabs for 2 days, 2 tabs for 2 days, then 1 tab by mouth daily for 2 days  07/17/20  Yes Delton See, MD  triamcinolone (NASACORT) 55 MCG/ACT AERO nasal inhaler Place 2 sprays into the nose daily.   Yes [provider]  Vitamin D, Ergocalciferol, (DRISDOL) 1.25 MG (50000 UNIT) CAPS capsule Take 1 capsule (50,000 Units total) by mouth every 7 (seven) days. 03/10/20  Yes Nadara Mustard, MD  acetaminophen (TYLENOL) 325 MG tablet Take 650 mg by mouth as needed.    [provider]  benzonatate (TESSALON) 200 MG capsule Take 1 capsule (200 mg total) by mouth 2 (two) times daily as needed for cough. 06/10/20   Rodriguez-Southworth, Nettie Elm, PA-C  fexofenadine (ALLEGRA) 180 MG tablet Take 180 mg by mouth daily.    [provider]  fluticasone  (FLONASE) 50 MCG/ACT nasal spray Place 2 sprays into both nostrils daily. 06/10/20   Rodriguez-Southworth, Nettie Elm, PA-C  phentermine (ADIPEX-P) 37.5 MG tablet Take 1/2 tab po with breakfast daily, hold if BP >140/80 05/21/20 06/10/20  Danelle Berry, PA-C    Family History Family History  Problem Relation Age of Onset  . Stroke Mother   . Hypertension Mother   . Heart murmur Mother   . Hypertension Father   . Breast cancer Maternal Aunt   . Ovarian cancer Maternal Aunt   . Lupus Sister   . Lupus Daughter   . Liver cancer Maternal Grandfather   . Cirrhosis Maternal Grandfather   . Pancreatic cancer Maternal Grandfather   . Hypertension Paternal Grandmother   . Sickle cell anemia Sister   . Thyroid cancer Sister   . Seizures Sister   . Hashimoto's thyroiditis Sister   . Fibromyalgia Sister     Social History Social History   Tobacco Use  . Smoking status: Never Smoker  . Smokeless tobacco: Never Used  Vaping Use  . Vaping Use: Never used  Substance Use Topics  . Alcohol use: No  . Drug use: No     Allergies   Erythromycin, Ibuprofen, and Vibramycin [doxycycline calcium]   Review of Systems Review of Systems  Constitutional: Negative for appetite change, chills, diaphoresis and fever.  HENT: Negative.  Negative for congestion, ear pain, facial swelling, rhinorrhea, sinus pressure, sinus pain and sore throat.   Eyes: Negative for pain.  Respiratory: Negative for apnea, cough, chest tightness, shortness of breath and wheezing.   Cardiovascular: Negative for chest pain and palpitations.  Gastrointestinal: Negative for abdominal pain.  Genitourinary: Negative for dysuria.  Musculoskeletal: Negative for myalgias.  Skin: Negative for color change, pallor, rash and wound.  Allergic/Immunologic: Positive for environmental allergies and food allergies.  Neurological: Negative for dizziness, syncope, light-headedness and headaches.     Physical Exam Triage Vital  Signs ED Triage Vitals  Enc Vitals Group     BP 07/17/20 1150 126/78     Pulse Rate 07/17/20 1150 68     Resp 07/17/20 1150 14     Temp 07/17/20 1150 98.3 F (36.8 C)     Temp Source 07/17/20 1150 Oral     SpO2 07/17/20 1150 100 %     Weight 07/17/20 1146 200 lb (90.7 kg)     Height 07/17/20 1146 5\' 6"  (1.676 m)     Head Circumference --      Peak Flow --      Pain Score 07/17/20 1146 0     Pain Loc --      Pain Edu? --      Excl. in GC? --    No data found.  Updated Vital Signs BP  126/78 (BP Location: Right Arm)   Pulse 68   Temp 98.3 F (36.8 C) (Oral)   Resp 14   Ht 5\' 6"  (1.676 m)   Wt 90.7 kg   SpO2 100%   BMI 32.28 kg/m   Visual Acuity Right Eye Distance:   Left Eye Distance:   Bilateral Distance:    Right Eye Near:   Left Eye Near:    Bilateral Near:     Physical Exam Vitals and nursing note reviewed.  Constitutional:      Appearance: Normal appearance.  HENT:     Head: Normocephalic and atraumatic.     Nose: Nose normal.     Mouth/Throat:     Mouth: Mucous membranes are moist.     Pharynx: No oropharyngeal exudate or posterior oropharyngeal erythema.  Eyes:     Extraocular Movements: Extraocular movements intact.     Pupils: Pupils are equal, round, and reactive to light.  Cardiovascular:     Rate and Rhythm: Normal rate and regular rhythm.     Pulses: Normal pulses.     Heart sounds: Normal heart sounds. No murmur heard. No friction rub. No gallop.   Pulmonary:     Effort: Pulmonary effort is normal. No respiratory distress.     Breath sounds: Normal breath sounds. No wheezing, rhonchi or rales.  Chest:     Chest wall: No tenderness.  Musculoskeletal:     Cervical back: Normal range of motion and neck supple.  Lymphadenopathy:     Cervical: No cervical adenopathy.  Skin:    General: Skin is warm and dry.     Capillary Refill: Capillary refill takes less than 2 seconds.     Findings: No rash.  Neurological:     General: No focal  deficit present.     Mental Status: She is alert and oriented to person, place, and time.  Psychiatric:        Mood and Affect: Mood normal.        Behavior: Behavior normal.      UC Treatments / Results  Labs (all labs ordered are listed, but only abnormal results are displayed) Labs Reviewed - No data to display  EKG   Radiology No results found.  Procedures Procedures (including critical care time)  Medications Ordered in UC Medications  methylPREDNISolone sodium succinate (SOLU-MEDROL) 125 mg/2 mL injection 125 mg (has no administration in time range)    Initial Impression / Assessment and Plan / UC Course  I have reviewed the triage vital signs and the nursing notes.  Pertinent labs & imaging results that were available during my care of the patient were reviewed by me and considered in my medical decision making (see chart for details).  Clinical pression: Allergic reaction to spicy ginger at a .  Patient has no anaphylaxis.  She reports her tongue is still tingling.  This is on a background history of some atopy.  Treatment plan: 1.  The findings and treatment plan were discussed in detail with the patient.  Patient was in agreement. 2.  We will give her Solu-Medrol 125 mg IM. 3.  Give her a Sterapred Dosepak which she will begin tomorrow. 4.  Continue with the Benadryl.  She can also consider a histamine blocker or an allergy medicine such as Allegra Zyrtec or Claritin. 5.  Educational handout was provided. 6.  I instructed her that if she got any shortness of breath or a sensation of tightening in her throat to go  directly to the nearest emergency room or call 911.  She voiced verbal understanding. 7.  Work note was not needed. 8.  Follow-up here as needed.    Final Clinical Impressions(s) / UC Diagnoses   Final diagnoses:  Allergic reaction to food, initial encounter  Hives     Discharge Instructions     He received a steroid shot  today. Please start the oral steroids tomorrow. I provided educational handouts on allergic reaction including hives and food allergies. Continue with the Benadryl.  Consider histamine blocker, or allergy medicine such as Allegra, Zyrtec, or Claritin. If you develop any shortness of breath or sensation her throat is tightening please call 911 or go directly to your nearest emergency room.  I will be get feeling better, Dr. Zachery Dauer    ED Prescriptions    Medication Sig Dispense Auth. Provider   predniSONE (STERAPRED UNI-PAK 21 TAB) 10 MG (21) TBPK tablet Take by mouth daily. Take 6 tabs by mouth daily  for 2 days, then 5 tabs for 2 days, then 4 tabs for 2 days, then 3 tabs for 2 days, 2 tabs for 2 days, then 1 tab by mouth daily for 2 days 42 tablet Delton See, MD     PDMP not reviewed this encounter.   Delton See, MD 07/18/20 [phone]

## 2020-07-17 NOTE — ED Triage Notes (Signed)
Patient states that she had some [place] food Thursday night.  Patient states that she woke up yesterday morning with hives.  Patient reports discoloring on her tongue and numbness in the roof of her mouth and at the back of her tongue that started yesterday.  Patient has taken Benadryl yesterday.  Patient denies difficulty breathing or swallowing.

## 2020-07-17 NOTE — Discharge Instructions (Addendum)
He received a steroid shot today. Please start the oral steroids tomorrow. I provided educational handouts on allergic reaction including hives and food allergies. Continue with the Benadryl.  Consider histamine blocker, or allergy medicine such as Allegra, Zyrtec, or Claritin. If you develop any shortness of breath or sensation her throat is tightening please call 911 or go directly to your nearest emergency room.  I will be get feeling better, Dr. Zachery Dauer

## 2020-08-03 ENCOUNTER — Other Ambulatory Visit (HOSPITAL_COMMUNITY): Payer: Self-pay | Admitting: Dermatology

## 2020-08-04 MED FILL — CLOBETASOL 0.05% SOLUTION: 0.05 | 30 days supply | Qty: 50 | Fill #0

## 2020-08-06 ENCOUNTER — Other Ambulatory Visit (HOSPITAL_COMMUNITY): Payer: Self-pay | Admitting: Family Medicine

## 2020-08-06 ENCOUNTER — Telehealth: Payer: Self-pay | Admitting: Family Medicine

## 2020-08-06 DIAGNOSIS — N760 Acute vaginitis: Secondary | ICD-10-CM

## 2020-08-06 MED ORDER — FLUCONAZOLE 150 MG PO TABS: 150.0000 mg | ORAL_TABLET | ORAL | 0 refills | Status: DC | PRN

## 2020-08-06 MED FILL — FLUCONAZOLE 150 MG TABS: 150 | 2 days supply | Qty: 2 | Fill #0

## 2020-08-06 NOTE — Telephone Encounter (Signed)
Please advise 

## 2020-08-06 NOTE — Telephone Encounter (Addendum)
Pt is calling and would like a new rx for diflucan. Pt went to [facility] on 07-17-2020 with allergic reaction to [place] food and was given 21 days of prednisone and now has vaginal itching, light discharge and sometimes pain when she urinates. Pt stop taking prednisone about week early. Pt would like rx to be sent  [facility] on [address] in [place] phone number [phone]. Pt forgot to ask md at urgent care to give her some diflucan

## 2020-08-25 ENCOUNTER — Ambulatory Visit: Payer: No Typology Code available for payment source | Admitting: Family Medicine

## 2020-09-10 MED FILL — LOSARTAN-HCTZ 50-12.5 MG TA: 50-12.5 | 30 days supply | Qty: 30 | Fill #3

## 2020-09-24 ENCOUNTER — Ambulatory Visit (INDEPENDENT_AMBULATORY_CARE_PROVIDER_SITE_OTHER): Payer: No Typology Code available for payment source | Admitting: Family Medicine

## 2020-09-24 ENCOUNTER — Other Ambulatory Visit (HOSPITAL_COMMUNITY): Payer: Self-pay

## 2020-09-24 ENCOUNTER — Other Ambulatory Visit: Payer: Self-pay

## 2020-09-24 ENCOUNTER — Encounter: Payer: Self-pay | Admitting: Family Medicine

## 2020-09-24 VITALS — BP 126/72 | HR 83 | Temp 98.1°F | Resp 16 | Ht 66.0 in | Wt 211.4 lb

## 2020-09-24 DIAGNOSIS — I10 Essential (primary) hypertension: Secondary | ICD-10-CM

## 2020-09-24 DIAGNOSIS — E669 Obesity, unspecified: Secondary | ICD-10-CM

## 2020-09-24 DIAGNOSIS — H1013 Acute atopic conjunctivitis, bilateral: Secondary | ICD-10-CM

## 2020-09-24 DIAGNOSIS — D582 Other hemoglobinopathies: Secondary | ICD-10-CM | POA: Diagnosis not present

## 2020-09-24 MED ORDER — HYDROCHLOROTHIAZIDE 12.5 MG PO TABS
12.5000 mg | ORAL_TABLET | Freq: Every day | ORAL | 0 refills | Status: DC
  Filled 2020-09-24: qty 90, 90d supply, fill #0

## 2020-09-24 MED ORDER — OLOPATADINE HCL 0.1 % OP SOLN
1.0000 [drp] | Freq: Two times a day (BID) | OPHTHALMIC | 2 refills | Status: AC
  Filled 2020-09-24: qty 5, 30d supply, fill #0

## 2020-09-24 NOTE — Progress Notes (Signed)
Name: Debra Stephens   MRN: [id number]    DOB: May 23, 1971   Date:09/24/2020       Progress Note  Subjective  Chief Complaint  Chief Complaint  Patient presents with  . Hypertension    HPI  HTN: she has been avoiding processed food, she has been keeping bp in the 120's/70's at work, sometimes she skips medication and bp still within normal limits. She did not take bp medication yesterday or today and bp is still at goal. She denies chest pain , palpitation of sob  Obesity: she was given phentermine by Sheliah Mends but she has not started it yet. She states her weight was normal in her teens and young adult age, she went up to 150 lbs after birth of her second child at age 41. She states after hysterectomy in her early 5's, she is going up since. She likes sodas bu she she gradually cut down and over the past 2 weeks no sodas. Her max weight is today at 211 lbs. She states even though she stopped sodas she has been eating more sweets. 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 No symptoms  Allergies: she has noticed increase irritation around her eye, itchy, sometimes crusty in the mornings. She takes allergy medications  Patient Active Problem List   Diagnosis Date Noted  . 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 Hypertension 04/22/2020  . Vitamin B12 deficiency 01/29/2018  . Recurrent sinusitis 12/14/2017  . Dysmetabolic syndrome 11/08/2017  . Chronic idiopathic constipation 08/13/2017  . Obesity (BMI 30-39.9) 07/27/2017  . 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 Vitamin D deficiency 11/22/2016  . ANA positive 05/24/2016  . H/O urticaria 09/08/2015  . Angio-edema 09/08/2015    Past Surgical History:  Procedure Laterality Date  . BREAST EXCISIONAL BIOPSY Left    age 75  . COLONOSCOPY  WITH PROPOFOL N/A 11/02/2017   Procedure: COLONOSCOPY WITH PROPOFOL;  Surgeon: Pasty Spillers, MD;  Location: [facility];  Service: Endoscopy;  Laterality: N/A;  . TUBAL LIGATION    . VAGINAL HYSTERECTOMY      Family History  Problem Relation Age of Onset  . Stroke Mother   . Hypertension Mother   . Heart murmur Mother   . Hypertension Father   . Breast cancer Maternal Aunt   . Ovarian cancer Maternal Aunt   . Lupus Sister   . Lupus Daughter   . Liver cancer Maternal Grandfather   . Cirrhosis Maternal Grandfather   . Pancreatic cancer Maternal Grandfather   . Hypertension Paternal Grandmother   . Sickle cell anemia Sister   . Thyroid cancer Sister   . Seizures Sister   . Hashimoto's thyroiditis Sister   . Fibromyalgia Sister     Social History   Tobacco Use  . Smoking status: Never Smoker  . Smokeless tobacco: Never Used  Substance Use Topics  . Alcohol use: No     Current Outpatient Medications:  .  acetaminophen (TYLENOL) 325 MG tablet, Take 650 mg by mouth as needed., Disp: , Rfl:  .  clobetasol (TEMOVATE) 0.05 % external solution, APPLY TO SCALP TWICE A DAY, Disp: 50 mL, Rfl: 1 .  fexofenadine (ALLEGRA) 180 MG tablet, Take 180 mg by mouth daily., Disp: , Rfl:  .  Multiple Vitamin (MULTIVITAMIN WITH MINERALS) TABS  tablet, Take 1 tablet by mouth daily., Disp: , Rfl:  .  triamcinolone (NASACORT) 55 MCG/ACT AERO nasal inhaler, Place 2 sprays into the nose daily., Disp: , Rfl:  .  Vitamin D, Ergocalciferol, (DRISDOL) 1.25 MG (50000 UNIT) CAPS capsule, Take 1 capsule (50,000 Units total) by mouth every 7 (seven) days. (Patient not taking: Reported on 09/24/2020), Disp: 4 capsule, Rfl: 2  Allergies  Allergen Reactions  . Erythromycin Other (See Comments)  . Ibuprofen Hives  . Vibramycin [Doxycycline Calcium] Hives    I personally reviewed active problem list, medication list, allergies, family history, social history with the patient/caregiver  today.   ROS  Constitutional: Negative for fever or weight change.  Respiratory: Negative for cough and shortness of breath.   Cardiovascular: Negative for chest pain or palpitations.  Gastrointestinal: Negative for abdominal pain, no bowel changes.  Musculoskeletal: Negative for gait problem or joint swelling.  Skin: Negative for rash.  Neurological: Negative for dizziness or headache.  No other specific complaints in a complete review of systems (except as listed in HPI above).  Objective  Vitals:   09/24/20 0712  BP: 126/72  Pulse: 83  Resp: 16  Temp: 98.1 F (36.7 C)  TempSrc: Oral  SpO2: 99%  Weight: 211 lb 6.4 oz (95.9 kg)  Height: 5\' 6"  (1.676 m)    Body mass index is 34.12 kg/m.  Physical Exam  Constitutional: Patient appears well-developed and well-nourished. Obese No distress.  HEENT: head atraumatic, normocephalic, pupils equal and reactive to light,  neck supple Cardiovascular: Normal rate, regular rhythm and normal heart sounds.  No murmur heard. No BLE edema. Pulmonary/Chest: Effort normal and breath sounds normal. No respiratory distress. Abdominal: Soft.  There is no tenderness. Psychiatric: Patient has a normal mood and affect. behavior is normal. Judgment and thought content normal.  PHQ2/9: Depression screen [facility] 2/9 09/24/2020 06/25/2020 05/21/2020 05/13/2020 04/08/2020  Decreased Interest 0 0 0 0 0  Down, Depressed, Hopeless 0 0 0 0 0  PHQ - 2 Score 0 0 0 0 0  Altered sleeping - - - - -  Tired, decreased energy - - - - -  Change in appetite - - - - -  Feeling bad or failure about yourself  - - - - -  Trouble concentrating - - - - -  Moving slowly or fidgety/restless - - - - -  Suicidal thoughts - - - - -  PHQ-9 Score - - - - -  Difficult doing work/chores - - - - -    phq 9 is negative   Fall Risk: Fall Risk  09/24/2020 06/25/2020 05/21/2020 05/13/2020 04/08/2020  Falls in the past year? 0 0 0 0 0  Number falls in past yr: 0 0 0 0 0  Injury  with Fall? 0 - 0 0 0  Follow up Falls evaluation completed Falls evaluation completed - - Falls evaluation completed    Functional Status Survey: Is the patient deaf or have difficulty hearing?: No Does the patient have difficulty seeing, even when wearing glasses/contacts?: No Does the patient have difficulty concentrating, remembering, or making decisions?: No Does the patient have difficulty walking or climbing stairs?: No Does the patient have difficulty dressing or bathing?: No Does the patient have difficulty doing errands alone such as visiting a doctor's office or shopping?: No    Assessment & Plan  1. Hemoglobin C (Hb-C) (HCC)  Keep follow up with Dr. 04/10/2020   2. Obesity (BMI 30-39.9)  Discussed with the patient  the risk posed by an increased BMI. Discussed importance of portion control, calorie counting and at least 150 minutes of physical activity weekly. Avoid sweet beverages and drink more water. Eat at least 6 servings of fruit and vegetables daily   3. Hypertension, benign  - hydrochlorothiazide (HYDRODIURIL) 12.5 MG tablet; Take 1 tablet (12.5 mg total) by mouth daily. In place of losartan  hctz  Dispense: 90 tablet; Refill: 0  4. Allergic conjunctivitis of both eyes  - olopatadine (PATANOL) 0.1 % ophthalmic solution; Place 1 drop into both eyes 2 (two) times daily.  Dispense: 5 mL; Refill: 2

## 2020-10-01 NOTE — Progress Notes (Deleted)
[place]  [phone] Fax:([phone]  ID: Debra Stephens OB: [birth year]  MR#: [id number]  DPO#:[id number]  Patient Care Team: Delsa Grana, PA-C as PCP - General (Family Medicine) Rico Junker, RN as Registered Nurse Theodore Demark, RN as Registered Nurse  I connected with Debra Stephens on 10/01/20 at  2:00 PM EDT by {Blank single:19197::"video enabled telemedicine visit","telephone visit"} and verified that I am speaking with the correct person using two identifiers.   I discussed the limitations, risks, security and privacy concerns of performing an evaluation and management service by telemedicine and the availability of in-person appointments. I also discussed with the patient that there may be a patient responsible charge related to this service. The patient expressed understanding and agreed to proceed.   Other persons participating in the visit and their role in the encounter: Patient, MD.  Patient's location: Home. Provider's location: Clinic.  CHIEF COMPLAINT: Hemoglobin C, leukopenia.  INTERVAL HISTORY: Patient agreed to video assisted telemedicine visit for further evaluation and discussion of her laboratory results.  She continues to feel well and remains asymptomatic.  She does not complain of any weakness or fatigue. She has no neurologic complaints.  She denies any recent fevers or illnesses.  She has a good appetite and denies weight loss.  She has no chest pain, shortness of breath, cough, or hemoptysis.  She denies any nausea, vomiting, constipation, or diarrhea.  She has no melena or hematochezia.  She has no urinary complaints.  Patient offers no specific complaints today.  REVIEW OF SYSTEMS:   Review of Systems  Constitutional: Negative.  Negative for fever, malaise/fatigue and weight loss.  Respiratory: Negative.  Negative for cough, hemoptysis and shortness of breath.   Cardiovascular: Negative.  Negative for chest  pain and leg swelling.  Gastrointestinal: Negative.  Negative for abdominal pain, blood in stool and melena.  Genitourinary: Negative.  Negative for hematuria.  Musculoskeletal: Negative.  Negative for back pain.  Skin: Negative.  Negative for rash.  Neurological: Negative.  Negative for dizziness, focal weakness, weakness and headaches.  Psychiatric/Behavioral: Negative.  The patient is not nervous/anxious.     As per HPI. Otherwise, a complete review of systems is negative.  PAST MEDICAL HISTORY: Past Medical History:  Diagnosis Date  . Abdominal tenderness, right upper quadrant   . Abnormal Pap smear of cervix   . Allergic rhinitis, cause unspecified   . Anemia   . Atypical chest pain 06/24/2013  . Chronic headaches   . Cough   . Family history of breast cancer   . Family history of pancreatic cancer    9/21 cancer genetic testing letter sent  . Lumbago   . Other and unspecified hyperlipidemia   . Swelling   . Thoracic or lumbosacral neuritis or radiculitis, unspecified   . Unspecified symptom associated with female genital organs   . Unspecified vitamin D deficiency     PAST SURGICAL HISTORY: Past Surgical History:  Procedure Laterality Date  . BREAST EXCISIONAL BIOPSY Left    age 50  . COLONOSCOPY WITH PROPOFOL N/A 11/02/2017   Procedure: COLONOSCOPY WITH PROPOFOL;  Surgeon: Virgel Manifold, MD;  Location: [facility] ENDOSCOPY;  Service: Endoscopy;  Laterality: N/A;  . TUBAL LIGATION    . VAGINAL HYSTERECTOMY      FAMILY HISTORY: Family History  Problem Relation Age of Onset  . Stroke Mother   . Hypertension Mother   . Heart murmur Mother   . Hypertension Father   .  Breast cancer Maternal Aunt   . Ovarian cancer Maternal Aunt   . Lupus Sister   . Lupus Daughter   . Liver cancer Maternal Grandfather   . Cirrhosis Maternal Grandfather   . Pancreatic cancer Maternal Grandfather   . Hypertension Paternal Grandmother   . Sickle cell anemia Sister   . Thyroid  cancer Sister   . Seizures Sister   . Hashimoto's thyroiditis Sister   . Fibromyalgia Sister     ADVANCED DIRECTIVES (Y/N):  N  HEALTH MAINTENANCE: Social History   Tobacco Use  . Smoking status: Never Smoker  . Smokeless tobacco: Never Used  Vaping Use  . Vaping Use: Never used  Substance Use Topics  . Alcohol use: No  . Drug use: No     Colonoscopy:  PAP:  Bone density:  Lipid panel:  Allergies  Allergen Reactions  . Erythromycin Other (See Comments)  . Ibuprofen Hives  . Vibramycin [Doxycycline Calcium] Hives    Current Outpatient Medications  Medication Sig Dispense Refill  . acetaminophen (TYLENOL) 325 MG tablet Take 650 mg by mouth as needed.    . clobetasol (TEMOVATE) 0.05 % external solution APPLY TO SCALP TWICE A DAY 50 mL 1  . fexofenadine (ALLEGRA) 180 MG tablet Take 180 mg by mouth daily.    . hydrochlorothiazide (HYDRODIURIL) 12.5 MG tablet Take 1 tablet (12.5 mg total) by mouth daily. In place of losartan  hctz 90 tablet 0  . Multiple Vitamin (MULTIVITAMIN WITH MINERALS) TABS tablet Take 1 tablet by mouth daily.    Marland Kitchen olopatadine (PATANOL) 0.1 % ophthalmic solution Place 1 drop into both eyes 2 (two) times daily. 5 mL 2  . triamcinolone (NASACORT) 55 MCG/ACT AERO nasal inhaler Place 2 sprays into the nose daily.    . Vitamin D, Ergocalciferol, (DRISDOL) 1.25 MG (50000 UNIT) CAPS capsule Take 1 capsule (50,000 Units total) by mouth every 7 (seven) days. (Patient not taking: Reported on 09/24/2020) 4 capsule 2   No current facility-administered medications for this visit.    OBJECTIVE: There were no vitals filed for this visit.   There is no height or weight on file to calculate BMI.    ECOG FS:0 - Asymptomatic  General: Well-developed, well-nourished, no acute distress. HEENT: Normocephalic. Neuro: Alert, answering all questions appropriately. Cranial nerves grossly intact. Psych: Normal affect.   LAB RESULTS:  Lab Results  Component Value Date    NA 141 06/25/2020   K 4.4 06/25/2020   CL 105 06/25/2020   CO2 29 06/25/2020   GLUCOSE 84 06/25/2020   BUN 11 06/25/2020   CREATININE 0.98 06/25/2020   CALCIUM 9.6 06/25/2020   PROT 6.9 06/25/2020   ALBUMIN 3.9 03/09/2020   AST 21 06/25/2020   ALT 17 06/25/2020   ALKPHOS 63 03/09/2020   BILITOT 0.9 06/25/2020   GFRNONAA 68 06/25/2020   GFRAA 79 06/25/2020    Lab Results  Component Value Date   WBC 3.7 (L) 06/10/2020   NEUTROABS 1.7 06/10/2020   HGB 12.1 06/10/2020   HCT 37.3 06/10/2020   MCV 71.7 (L) 06/10/2020   PLT 184 06/10/2020   Lab Results  Component Value Date   IRON 87 03/15/2020   TIBC 270 03/15/2020   IRONPCTSAT 32 (H) 03/15/2020   Lab Results  Component Value Date   FERRITIN 78 03/15/2020     STUDIES: No results found.  ASSESSMENT: Hemoglobin C, leukopenia.  PLAN:    1.  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  Clinically insignificant, but  explains patient's microcytosis.  All of her other laboratory work including iron stores is within normal limits.  No further intervention is needed. 2.  Leukopenia: Patient noted to have antineutrophil antibodies that may be causing her decreased white blood cell counts.  These can occasionally be transient in nature.  No intervention is needed.  Patient does not require bone marrow biopsy.  Return to clinic in 6 months with repeat laboratory work and further evaluation. 3.  Microcytosis: Secondary to hemoglobin C.  I provided *** minutes of {Blank single:19197::"face-to-face video visit time","non face-to-face telephone visit time"} during this encounter which included chart review, counseling, and coordination of care as documented above.    Patient expressed understanding and was in agreement with this plan. She also understands that She can call clinic at any time with any questions, concerns, or complaints.     Lloyd Huger, MD   10/01/2020 7:01 AM

## 2020-10-04 ENCOUNTER — Inpatient Hospital Stay: Payer: No Typology Code available for payment source

## 2020-10-05 ENCOUNTER — Inpatient Hospital Stay: Payer: No Typology Code available for payment source | Admitting: Oncology

## 2020-10-09 NOTE — Progress Notes (Signed)
[facility]  Telephone:(336) [phone] Fax:(336) [phone]  ID: Debra Stephens OB: 1971-02-25  MR#: [id number]  HGD#:[id number]  Patient Care Team: Danelle Berry, PA-C as PCP - General (Family Medicine) Jim Like, RN as Registered Nurse Scarlett Presto, RN as Registered Nurse  I connected with Debra Stephens on 10/14/20 at  3:30 PM EDT by video enabled telemedicine visit and verified that I am speaking with the correct person using two identifiers.   I discussed the limitations, risks, security and privacy concerns of performing an evaluation and management service by telemedicine and the availability of in-person appointments. I also discussed with the patient that there may be a patient responsible charge related to this service. The patient expressed understanding and agreed to proceed.   Other persons participating in the visit and their role in the encounter: Patient, MD.  Patient's location: Work. Provider's location: Clinic.  CHIEF COMPLAINT: Hemoglobin C, leukopenia.  INTERVAL HISTORY: Patient agreed to video assisted telemedicine visit for routine 14-month evaluation and discussion of her laboratory results.  She continues to feel well and remains asymptomatic.  She continues to be active and work full-time.  She denies any weakness or fatigue.  She has no neurologic complaints.  She denies any recent fevers or illnesses.  She has a good appetite and denies weight loss.  She has no chest pain, shortness of breath, cough, or hemoptysis.  She denies any nausea, vomiting, constipation, or diarrhea.  She has no melena or hematochezia.  She has no urinary complaints.  Patient offers no specific complaints today.  REVIEW OF SYSTEMS:   Review of Systems  Constitutional: Negative.  Negative for fever, malaise/fatigue and weight loss.  Respiratory: Negative.  Negative for cough, hemoptysis and shortness of breath.   Cardiovascular: Negative.  Negative for chest  pain and leg swelling.  Gastrointestinal: Negative.  Negative for abdominal pain, blood in stool and melena.  Genitourinary: Negative.  Negative for hematuria.  Musculoskeletal: Negative.  Negative for back pain.  Skin: Negative.  Negative for rash.  Neurological: Negative.  Negative for dizziness, focal weakness, weakness and headaches.  Psychiatric/Behavioral: Negative.  The patient is not nervous/anxious.     As per HPI. Otherwise, a complete review of systems is negative.  PAST MEDICAL HISTORY: Past Medical History:  Diagnosis Date  . Abdominal tenderness, right upper quadrant   . Abnormal Pap smear of cervix   . Allergic rhinitis, cause unspecified   . Anemia   . Atypical chest pain 06/24/2013  . Chronic headaches   . Cough   . Family history of breast cancer   . Family history of pancreatic cancer    9/21 cancer genetic testing letter sent  . Lumbago   . Other and unspecified hyperlipidemia   . Swelling   . Thoracic or lumbosacral neuritis or radiculitis, unspecified   . Unspecified symptom associated with female genital organs   . Unspecified vitamin D deficiency     PAST SURGICAL HISTORY: Past Surgical History:  Procedure Laterality Date  . BREAST EXCISIONAL BIOPSY Left    age 45  . COLONOSCOPY WITH PROPOFOL N/A 11/02/2017   Procedure: COLONOSCOPY WITH PROPOFOL;  Surgeon: Pasty Spillers, MD;  Location: [facility] ENDOSCOPY;  Service: Endoscopy;  Laterality: N/A;  . TUBAL LIGATION    . VAGINAL HYSTERECTOMY      FAMILY HISTORY: Family History  Problem Relation Age of Onset  . Stroke Mother   . Hypertension Mother   . Heart murmur Mother   .  Hypertension Father   . Breast cancer Maternal Aunt   . Ovarian cancer Maternal Aunt   . Lupus Sister   . Lupus Daughter   . Liver cancer Maternal Grandfather   . Cirrhosis Maternal Grandfather   . Pancreatic cancer Maternal Grandfather   . Hypertension Paternal Grandmother   . Sickle cell anemia Sister   . Thyroid  cancer Sister   . Seizures Sister   . Hashimoto's thyroiditis Sister   . Fibromyalgia Sister     ADVANCED DIRECTIVES (Y/N):  N  HEALTH MAINTENANCE: Social History   Tobacco Use  . Smoking status: Never Smoker  . Smokeless tobacco: Never Used  Vaping Use  . Vaping Use: Never used  Substance Use Topics  . Alcohol use: No  . Drug use: No     Colonoscopy:  PAP:  Bone density:  Lipid panel:  Allergies  Allergen Reactions  . Erythromycin Other (See Comments)  . Ibuprofen Hives  . Vibramycin [Doxycycline Calcium] Hives    Current Outpatient Medications  Medication Sig Dispense Refill  . acetaminophen (TYLENOL) 325 MG tablet Take 650 mg by mouth as needed.    . clobetasol (TEMOVATE) 0.05 % external solution APPLY TO SCALP TWICE A DAY 50 mL 1  . fexofenadine (ALLEGRA) 180 MG tablet Take 180 mg by mouth daily.    . hydrochlorothiazide (HYDRODIURIL) 12.5 MG tablet Take 1 tablet (12.5 mg total) by mouth daily. In place of losartan  hctz 90 tablet 0  . Multiple Vitamin (MULTIVITAMIN WITH MINERALS) TABS tablet Take 1 tablet by mouth daily.    Marland Kitchen olopatadine (PATANOL) 0.1 % ophthalmic solution Place 1 drop into both eyes 2 (two) times daily. 5 mL 2  . triamcinolone (NASACORT) 55 MCG/ACT AERO nasal inhaler Place 2 sprays into the nose daily.    . Vitamin D, Ergocalciferol, (DRISDOL) 1.25 MG (50000 UNIT) CAPS capsule Take 1 capsule (50,000 Units total) by mouth every 7 (seven) days. 4 capsule 2   No current facility-administered medications for this visit.    OBJECTIVE: There were no vitals filed for this visit.   There is no height or weight on file to calculate BMI.    ECOG FS:0 - Asymptomatic  General: Well-developed, well-nourished, no acute distress. HEENT: Normocephalic. Neuro: Alert, answering all questions appropriately. Cranial nerves grossly intact. Psych: Normal affect.  LAB RESULTS:  Lab Results  Component Value Date   NA 141 06/25/2020   K 4.4 06/25/2020   CL  105 06/25/2020   CO2 29 06/25/2020   GLUCOSE 84 06/25/2020   BUN 11 06/25/2020   CREATININE 0.98 06/25/2020   CALCIUM 9.6 06/25/2020   PROT 6.9 06/25/2020   ALBUMIN 3.9 03/09/2020   AST 21 06/25/2020   ALT 17 06/25/2020   ALKPHOS 63 03/09/2020   BILITOT 0.9 06/25/2020   GFRNONAA 68 06/25/2020   GFRAA 79 06/25/2020    Lab Results  Component Value Date   WBC 4.3 10/13/2020   NEUTROABS 2.0 10/13/2020   HGB 12.5 10/13/2020   HCT 38.4 10/13/2020   MCV 73.6 (L) 10/13/2020   PLT 199 10/13/2020   Lab Results  Component Value Date   IRON 87 03/15/2020   TIBC 270 03/15/2020   IRONPCTSAT 32 (H) 03/15/2020   Lab Results  Component Value Date   FERRITIN 78 03/15/2020     STUDIES: No results found.  ASSESSMENT: Hemoglobin C, leukopenia.  PLAN:    1.  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  Clinically insignificant, but explains patient's microcytosis.  All of her other laboratory work including iron stores is within normal limits.  No further intervention is needed. 2.  Leukopenia: Resolved.  Previously, patient noted to have antineutrophil antibodies that may be causing her decreased white blood cell counts.  These can be transient in nature and is likely clinically insignificant.  No further intervention is needed.  No further follow-up has been scheduled.  Please refer patient back if there are any questions or concerns. 3.  Microcytosis: Secondary to hemoglobin C.  I provided 20 minutes of face-to-face video visit time during this encounter which included chart review, counseling, and coordination of care as documented above.    Patient expressed understanding and was in agreement with this plan. She also understands that She can call clinic at any time with any questions, concerns, or complaints.     Jeralyn Ruths, MD   10/14/2020 3:57 PM

## 2020-10-13 ENCOUNTER — Inpatient Hospital Stay: Payer: No Typology Code available for payment source | Attending: Oncology

## 2020-10-13 DIAGNOSIS — D72819 Decreased white blood cell count, unspecified: Secondary | ICD-10-CM | POA: Insufficient documentation

## 2020-10-13 DIAGNOSIS — Z79899 Other long term (current) drug therapy: Secondary | ICD-10-CM | POA: Insufficient documentation

## 2020-10-13 LAB — CBC WITH DIFFERENTIAL/PLATELET
Abs Immature Granulocytes: 0 10*3/uL (ref 0.00–0.07)
Basophils Absolute: 0 10*3/uL (ref 0.0–0.1)
Basophils Relative: 1 %
Eosinophils Absolute: 0.1 10*3/uL (ref 0.0–0.5)
Eosinophils Relative: 2 %
HCT: 38.4 % (ref 36.0–46.0)
Hemoglobin: 12.5 g/dL (ref 12.0–15.0)
Immature Granulocytes: 0 %
Lymphocytes Relative: 37 %
Lymphs Abs: 1.6 10*3/uL (ref 0.7–4.0)
MCH: 23.9 pg — ABNORMAL LOW (ref 26.0–34.0)
MCHC: 32.6 g/dL (ref 30.0–36.0)
MCV: 73.6 fL — ABNORMAL LOW (ref 80.0–100.0)
Monocytes Absolute: 0.6 10*3/uL (ref 0.1–1.0)
Monocytes Relative: 15 %
Neutro Abs: 2 10*3/uL (ref 1.7–7.7)
Neutrophils Relative %: 45 %
Platelets: 199 10*3/uL (ref 150–400)
RBC: 5.22 MIL/uL — ABNORMAL HIGH (ref 3.87–5.11)
RDW: 15.9 % — ABNORMAL HIGH (ref 11.5–15.5)
WBC: 4.3 10*3/uL (ref 4.0–10.5)
nRBC: 0 % (ref 0.0–0.2)

## 2020-10-14 ENCOUNTER — Inpatient Hospital Stay (HOSPITAL_BASED_OUTPATIENT_CLINIC_OR_DEPARTMENT_OTHER): Payer: No Typology Code available for payment source | Admitting: Oncology

## 2020-10-14 ENCOUNTER — Encounter: Payer: Self-pay | Admitting: Oncology

## 2020-10-14 DIAGNOSIS — D72819 Decreased white blood cell count, unspecified: Secondary | ICD-10-CM

## 2020-11-29 ENCOUNTER — Other Ambulatory Visit (HOSPITAL_COMMUNITY): Payer: Self-pay

## 2020-11-29 ENCOUNTER — Other Ambulatory Visit: Payer: Self-pay

## 2020-11-29 ENCOUNTER — Ambulatory Visit (INDEPENDENT_AMBULATORY_CARE_PROVIDER_SITE_OTHER): Payer: No Typology Code available for payment source | Admitting: Family Medicine

## 2020-11-29 ENCOUNTER — Encounter: Payer: Self-pay | Admitting: Family Medicine

## 2020-11-29 VITALS — BP 120/72 | HR 81 | Temp 98.2°F | Resp 16 | Ht 66.0 in | Wt 211.5 lb

## 2020-11-29 DIAGNOSIS — Z1159 Encounter for screening for other viral diseases: Secondary | ICD-10-CM

## 2020-11-29 DIAGNOSIS — E785 Hyperlipidemia, unspecified: Secondary | ICD-10-CM | POA: Diagnosis not present

## 2020-11-29 DIAGNOSIS — I10 Essential (primary) hypertension: Secondary | ICD-10-CM

## 2020-11-29 DIAGNOSIS — E8881 Metabolic syndrome: Secondary | ICD-10-CM | POA: Diagnosis not present

## 2020-11-29 DIAGNOSIS — D582 Other hemoglobinopathies: Secondary | ICD-10-CM

## 2020-11-29 DIAGNOSIS — E559 Vitamin D deficiency, unspecified: Secondary | ICD-10-CM

## 2020-11-29 DIAGNOSIS — E669 Obesity, unspecified: Secondary | ICD-10-CM

## 2020-11-29 DIAGNOSIS — Z1231 Encounter for screening mammogram for malignant neoplasm of breast: Secondary | ICD-10-CM

## 2020-11-29 DIAGNOSIS — Z23 Encounter for immunization: Secondary | ICD-10-CM

## 2020-11-29 MED ORDER — HYDROCHLOROTHIAZIDE 12.5 MG PO TABS
12.5000 mg | ORAL_TABLET | Freq: Every day | ORAL | 3 refills | Status: DC
  Filled 2020-11-29: qty 90, 90d supply, fill #0

## 2020-11-29 NOTE — Progress Notes (Signed)
Name: Debra Stephens   MRN: [id number]    DOB: 01/02/1971   Date:11/29/2020       Progress Note  Chief Complaint  Patient presents with   Hypertension     Subjective:   Debra Stephens is a 50 y.o. female, presents to clinic for routine f/up on HTN  Hypertension:  Currently managed on HCTZ 12.5 mg daily Pt reports good med compliance and denies any SE.   Blood pressure today is well controlled. BP Readings from Last 3 Encounters:  11/29/20 120/72  09/24/20 126/72  07/17/20 126/78   Pt denies CP, SOB, exertional sx, LE edema, palpitation, Ha's, visual disturbances, lightheadedness, hypotension, syncope. Dietary efforts for BP?  Still working on low salt diet and being active  Vit D deficiency - still taking supplement Last labs Vit D normal Last vitamin D Lab Results  Component Value Date   VD25OH 62 06/25/2020   Obesity- Pt has not worked out as much as she would like to recently, goal is to get back to the gym more regularly The end of last year she was making progress with weight loss, however she got COVID beginning of 2022 and that's when she stopped exercising as much and regained weight back, low of 198, today 211, same as 2 months ago  Wt Readings from Last 10 Encounters:  11/29/20 211 lb 8 oz (95.9 kg)  09/24/20 211 lb 6.4 oz (95.9 kg)  07/17/20 200 lb (90.7 kg)  06/25/20 202 lb 8 oz (91.9 kg)  06/10/20 198 lb (89.8 kg)  05/21/20 209 lb 9.6 oz (95.1 kg)  04/08/20 206 lb 4.8 oz (93.6 kg)  03/15/20 204 lb 14.4 oz (92.9 kg)  03/15/20 205 lb 11.2 oz (93.3 kg)  03/09/20 204 lb (92.5 kg)   BMI Readings from Last 5 Encounters:  11/29/20 34.14 kg/m  09/24/20 34.12 kg/m  07/17/20 32.28 kg/m  06/25/20 32.68 kg/m  06/10/20 31.96 kg/m   She was prescribed phentermine in the past, she was considering saxenda but is afraid to do shots  Hgb C w/o anemia - she was seeing hematology Last labs reviewed: Hemoglobin  Date Value Ref Range Status   10/13/2020 12.5 12.0 - 15.0 g/dL Final  23/76/2831 51.7 12.0 - 15.0 g/dL Final  61/60/7371 06.2 12.0 - 15.0 g/dL Final  69/48/5462 70.3 11.1 - 15.9 g/dL Final  50/02/3817 29.9 12.0 - 16.0 g/dL Final   HGB  Date Value Ref Range Status  05/13/2013 12.1 12.0 - 16.0 g/dL Final  37/16/9678 93.8 12.0 - 16.0 g/dL Final  03/28/5101 58.5 12.0 - 16.0 g/dL Final   MCV at her baseline She has low WBC count but last labs were normal as well Due for hep C screening Wants shingles shot      Current Outpatient Medications:    acetaminophen (TYLENOL) 325 MG tablet, Take 650 mg by mouth as needed., Disp: , Rfl:    clobetasol (TEMOVATE) 0.05 % external solution, APPLY TO SCALP TWICE A DAY, Disp: 50 mL, Rfl: 1   fexofenadine (ALLEGRA) 180 MG tablet, Take 180 mg by mouth daily., Disp: , Rfl:    hydrochlorothiazide (HYDRODIURIL) 12.5 MG tablet, Take 1 tablet (12.5 mg total) by mouth daily. In place of losartan  hctz, Disp: 90 tablet, Rfl: 0   Multiple Vitamin (MULTIVITAMIN WITH MINERALS) TABS tablet, Take 1 tablet by mouth daily., Disp: , Rfl:    olopatadine (PATANOL) 0.1 % ophthalmic solution, Place 1 drop into both eyes 2 (two) times daily.,  Disp: 5 mL, Rfl: 2   triamcinolone (NASACORT) 55 MCG/ACT AERO nasal inhaler, Place 2 sprays into the nose daily., Disp: , Rfl:    Vitamin D, Ergocalciferol, (DRISDOL) 1.25 MG (50000 UNIT) CAPS capsule, Take 1 capsule (50,000 Units total) by mouth every 7 (seven) days., Disp: 4 capsule, Rfl: 2  Patient Active Problem List   Diagnosis Date Noted   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   Hypertension 04/22/2020   Vitamin B12 deficiency 01/29/2018   Recurrent sinusitis 12/14/2017   Dysmetabolic syndrome 11/08/2017   Chronic idiopathic constipation 08/13/2017   Obesity (BMI 30-39.9) 07/27/2017   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   Vitamin D deficiency 11/22/2016   ANA positive 05/24/2016   H/O urticaria 09/08/2015   Angio-edema 09/08/2015    Past  Surgical History:  Procedure Laterality Date   BREAST EXCISIONAL BIOPSY Left    age 41   COLONOSCOPY WITH PROPOFOL N/A 11/02/2017   Procedure: COLONOSCOPY WITH PROPOFOL;  Surgeon: Pasty Spillers, MD;  Location: [facility] END[facility]COPY;  Service: Endoscopy;  Laterality: N/A;   TUBAL LIGATION     VAGINAL HYSTERECTOMY      Family History  Problem Relation Age of Onset   Stroke Mother    Hypertension Mother    Heart murmur Mother    Hypertension Father    Breast cancer Maternal Aunt    Ovarian cancer Maternal Aunt    Lupus Sister    Lupus Daughter    Liver cancer Maternal Grandfather    Cirrhosis Maternal Grandfather    Pancreatic cancer Maternal Grandfather    Hypertension Paternal Grandmother    Sickle cell anemia Sister    Thyroid cancer Sister    Seizures Sister    Hashimoto's thyroiditis Sister    Fibromyalgia Sister     Social History   Tobacco Use   Smoking status: Never   Smokeless tobacco: Never  Vaping Use   Vaping Use: Never used  Substance Use Topics   Alcohol use: No   Drug use: No     Allergies  Allergen Reactions   Erythromycin Other (See Comments)   Ibuprofen Hives   Vibramycin [Doxycycline Calcium] Hives    Health Maintenance  Topic Date Due   Hepatitis C Screening  Never done   COVID-19 Vaccine (3 - Booster for Pfizer series) 02/20/2020   Pneumococcal Vaccine 98-34 Years old (1 - PCV) 12/09/2020 (Originally 11/29/1976)   Zoster Vaccines- Shingrix (1 of 2) 12/09/2020 (Originally 11/29/1989)   INFLUENZA VACCINE  01/10/2021   MAMMOGRAM  04/14/2022   COLONOSCOPY (Pts 45-62yrs Insurance coverage will need to be confirmed)  11/03/2027   TETANUS/TDAP  01/30/2028   HIV Screening  Completed   HPV VACCINES  Aged Out   PAP SMEAR-Modifier  Discontinued    Chart Review Today: I personally reviewed active problem list, medication list, allergies, family history, social history, health maintenance, notes from last encounter, lab results, imaging with the  patient/caregiver today. Personally reviewed last hematology labs/OV  Review of Systems  Constitutional: Negative.   HENT: Negative.    Eyes: Negative.   Respiratory: Negative.    Cardiovascular: Negative.   Gastrointestinal: Negative.   Endocrine: Negative.   Genitourinary: Negative.   Musculoskeletal: Negative.   Skin: Negative.   Allergic/Immunologic: Negative.   Neurological: Negative.   Hematological: Negative.   Psychiatric/Behavioral: Negative.    All other systems reviewed and are negative.   Objective:   Vitals:   11/29/20 0910  BP: 120/72  Pulse: 81  Resp: 16  Temp: 98.2 F (36.8 C)  SpO2: 99%  Weight: 211 lb 8 oz (95.9 kg)  Height: 5\' 6"  (1.676 m)    Body mass index is 34.14 kg/m.  Physical Exam Vitals and nursing note reviewed.  Constitutional:      General: She is not in acute distress.    Appearance: Normal appearance. She is obese. She is not ill-appearing, toxic-appearing or diaphoretic.  HENT:     Head: Normocephalic and atraumatic.  Eyes:     General:        Right eye: No discharge.        Left eye: No discharge.     Conjunctiva/sclera: Conjunctivae normal.  Cardiovascular:     Rate and Rhythm: Normal rate and regular rhythm.     Pulses: Normal pulses.     Heart sounds: Normal heart sounds.  Pulmonary:     Effort: Pulmonary effort is normal. No respiratory distress.     Breath sounds: Normal breath sounds. No wheezing, rhonchi or rales.  Abdominal:     General: Bowel sounds are normal.     Palpations: Abdomen is soft.  Musculoskeletal:     Cervical back: Normal range of motion.     Right lower leg: No edema.     Left lower leg: No edema.  Skin:    General: Skin is warm and dry.     Coloration: Skin is not jaundiced or pale.     Findings: No lesion or rash.  Neurological:     Mental Status: She is alert. Mental status is at baseline.     Gait: Gait normal.  Psychiatric:        Mood and Affect: Mood normal.        Behavior:  Behavior normal.        Assessment & Plan:     ICD-10-CM   1. Hypertension, benign  I10 BASIC METABOLIC PANEL WITH GFR    hydrochlorothiazide (HYDRODIURIL) 12.5 MG tablet    CANCELED: BASIC METABOLIC PANEL WITH GFR   stable, well controlled, BP at goal, good compliance with HCTZ 12.5 mg dose, no SE or concerns, will recheck labs    2. Encounter for hepatitis C screening test for low risk patient  Z11.59 Hepatitis C Antibody    3. Hyperlipidemia, unspecified hyperlipidemia type  E78.5    will recheck at next CPE    4. Dysmetabolic syndrome  E88.81    see below - GLP-1 a good option    5. Obesity (BMI 30-39.9)  E66.9    discussed saxenda, pt wants to try and hesitant to do shots - discussed options, can order and do PA, she chooses to work on diet/exercise    6. 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 Vitamin D deficiency  E55.9    reviewed supplementation and last labs which showed Vit D at normal (even high) level    8. Encounter for screening mammogram for malignant neoplasm of breast  Z12.31 MM 3D SCREEN BREAST BILATERAL    9. Need for shingles vaccine  Z23    will return to get in clinic       Return in about 6 months (around 05/31/2021) for Annual Physical, Routine follow-up.   06/02/2021, PA-C 11/29/20 9:17 AM

## 2020-11-29 NOTE — Patient Instructions (Signed)
Results for orders placed or performed in visit on 10/13/20  CBC with Differential/Platelet  Result Value Ref Range   WBC 4.3 4.0 - 10.5 K/uL   RBC 5.22 (H) 3.87 - 5.11 MIL/uL   Hemoglobin 12.5 12.0 - 15.0 g/dL   HCT 02.7 74.1 - 28.7 %   MCV 73.6 (L) 80.0 - 100.0 fL   MCH 23.9 (L) 26.0 - 34.0 pg   MCHC 32.6 30.0 - 36.0 g/dL   RDW 86.7 (H) 67.2 - 09.4 %   Platelets 199 150 - 400 K/uL   nRBC 0.0 0.0 - 0.2 %   Neutrophils Relative % 45 %   Neutro Abs 2.0 1.7 - 7.7 K/uL   Lymphocytes Relative 37 %   Lymphs Abs 1.6 0.7 - 4.0 K/uL   Monocytes Relative 15 %   Monocytes Absolute 0.6 0.1 - 1.0 K/uL   Eosinophils Relative 2 %   Eosinophils Absolute 0.1 0.0 - 0.5 K/uL   Basophils Relative 1 %   Basophils Absolute 0.0 0.0 - 0.1 K/uL   Immature Granulocytes 0 %   Abs Immature Granulocytes 0.00 0.00 - 0.07 K/uL

## 2020-12-06 ENCOUNTER — Other Ambulatory Visit (HOSPITAL_COMMUNITY): Payer: Self-pay

## 2020-12-07 ENCOUNTER — Other Ambulatory Visit (HOSPITAL_COMMUNITY): Payer: Self-pay

## 2020-12-27 ENCOUNTER — Other Ambulatory Visit (HOSPITAL_COMMUNITY): Payer: Self-pay

## 2021-01-10 ENCOUNTER — Other Ambulatory Visit: Payer: Self-pay

## 2021-01-10 ENCOUNTER — Encounter: Payer: Self-pay | Admitting: Family Medicine

## 2021-01-10 ENCOUNTER — Ambulatory Visit (INDEPENDENT_AMBULATORY_CARE_PROVIDER_SITE_OTHER): Payer: No Typology Code available for payment source | Admitting: Family Medicine

## 2021-01-10 VITALS — BP 138/86 | HR 94 | Temp 98.2°F | Resp 16 | Ht 66.0 in | Wt 215.0 lb

## 2021-01-10 DIAGNOSIS — Z91018 Allergy to other foods: Secondary | ICD-10-CM | POA: Diagnosis not present

## 2021-01-10 DIAGNOSIS — H0100B Unspecified blepharitis left eye, upper and lower eyelids: Secondary | ICD-10-CM

## 2021-01-10 DIAGNOSIS — H0100A Unspecified blepharitis right eye, upper and lower eyelids: Secondary | ICD-10-CM

## 2021-01-10 DIAGNOSIS — H1031 Unspecified acute conjunctivitis, right eye: Secondary | ICD-10-CM | POA: Diagnosis not present

## 2021-01-10 MED ORDER — CIPROFLOXACIN HCL 0.3 % OP SOLN
1.0000 [drp] | OPHTHALMIC | 0 refills | Status: DC
  Filled 2021-01-10: qty 5, 9d supply, fill #0

## 2021-01-10 MED ORDER — EPINEPHRINE 0.3 MG/0.3ML IJ SOAJ
0.3000 mg | INTRAMUSCULAR | 0 refills | Status: DC | PRN
  Filled 2021-01-10: qty 2, 2d supply, fill #0

## 2021-01-10 NOTE — Progress Notes (Signed)
Name: Debra Stephens   MRN: [id number]    DOB: 09/16/1970   Date:01/10/2021       Progress Note  Subjective  Chief Complaint  Chief Complaint  Patient presents with   Conjunctivitis    HPI  Angioedema: she has food allergy, skin of red apple, grass, some trees, pet dander She saw allergist a few years ago at [facility] She has gone to [facility] in the past with her lips swollen, she has broken up in hives after drinking alcohol ( but she stopped drinking anything 6 years ago) . She never had wheezing  Right eye: started last week, but getting progressively worse, conjunctiva was red, yellow crust when waking up, feels like there is a film covering her eye, not as clear from right side. It feels sore on top eyelash and also the eye itself. No fever or chills. Does not wear contacts. No trauma . She has been using warm compresses and seems to help She is using clear tears and Zanitor - but it caused her eye to burn. 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   Vitamin D deficiency 11/22/2016   ANA positive 05/24/2016   H/O urticaria 09/08/2015   Angio-edema 09/08/2015    Past Surgical History:  Procedure Laterality Date   BREAST EXCISIONAL BIOPSY Left    age 72   COLONOSCOPY WITH PROPOFOL N/A 11/02/2017   Procedure: COLONOSCOPY WITH PROPOFOL;  Surgeon: Pasty Spillers, MD;  Location: [facility] ENDOSCOPY;  Service: Endoscopy;  Laterality: N/A;   TUBAL LIGATION     VAGINAL HYSTERECTOMY      Family History  Problem Relation Age of Onset   Stroke Mother    Hypertension Mother    Heart murmur Mother    Hypertension  Father    Breast cancer Maternal Aunt    Ovarian cancer Maternal Aunt    Lupus Sister    Lupus Daughter    Liver cancer Maternal Grandfather    Cirrhosis Maternal Grandfather    Pancreatic cancer Maternal Grandfather    Hypertension Paternal Grandmother    Sickle cell anemia Sister    Thyroid cancer Sister    Seizures Sister    Hashimoto's thyroiditis Sister    Fibromyalgia Sister     Social History   Tobacco Use   Smoking status: Never   Smokeless tobacco: Never  Substance Use Topics   Alcohol use: No     Current Outpatient Medications:    acetaminophen (TYLENOL) 325 MG tablet, Take 650 mg by mouth as needed., Disp: , Rfl:    clobetasol (TEMOVATE) 0.05 % external solution, APPLY TO SCALP TWICE A DAY, Disp: 50 mL, Rfl: 1   fexofenadine (ALLEGRA) 180 MG tablet, Take 180 mg by mouth daily., Disp: , Rfl:    hydrochlorothiazide (HYDRODIURIL) 12.5 MG tablet, Take 1 tablet (12.5 mg total) by mouth daily., Disp: 90 tablet, Rfl: 3   Multiple Vitamin (MULTIVITAMIN WITH MINERALS) TABS tablet, Take 1 tablet by mouth daily., Disp: , Rfl:    olopatadine (PATANOL) 0.1 % ophthalmic solution, Place 1 drop into both eyes 2 (two) times daily., Disp: 5 mL, Rfl: 2  triamcinolone (NASACORT) 55 MCG/ACT AERO nasal inhaler, Place 2 sprays into the nose daily., Disp: , Rfl:    Vitamin D, Ergocalciferol, (DRISDOL) 1.25 MG (50000 UNIT) CAPS capsule, Take 1 capsule (50,000 Units total) by mouth every 7 (seven) days., Disp: 4 capsule, Rfl: 2  Allergies  Allergen Reactions   Erythromycin Other (See Comments)   Ibuprofen Hives   Vibramycin [Doxycycline Calcium] Hives    I personally reviewed active problem list, medication list, allergies, family history, social history, health maintenance with the patient/caregiver today.   ROS  Ten systems reviewed and is negative except as mentioned in HPI   Objective  Vitals:   01/10/21 0854  BP: 138/86  Pulse: 94  Resp: 16  Temp: 98.2 F (36.8 C)   SpO2: 97%  Weight: 215 lb (97.5 kg)  Height: 5\' 6"  (1.676 m)    Body mass index is 34.7 kg/m.  Physical Exam  Constitutional: Patient appears well-developed and well-nourished. Obese  No distress.  HEENT: head atraumatic, normocephalic, pupils equal and reactive to light, neck supple, no injection of conjunctiva, erythema and bumps on right lower lid, redness on eye lids Cardiovascular: Normal rate, regular rhythm and normal heart sounds.  No murmur heard. No BLE edema. Pulmonary/Chest: Effort normal and breath sounds normal. No respiratory distress. Abdominal: Soft.  There is no tenderness. Psychiatric: Patient has a normal mood and affect. behavior is normal. Judgment and thought content normal.   Recent Results (from the past 2160 hour(s))  CBC with Differential/Platelet     Status: Abnormal   Collection Time: 10/13/20  8:35 AM  Result Value Ref Range   WBC 4.3 4.0 - 10.5 K/uL   RBC 5.22 (H) 3.87 - 5.11 MIL/uL   Hemoglobin 12.5 12.0 - 15.0 g/dL   HCT 12/13/20 24.4 - 01.0 %   MCV 73.6 (L) 80.0 - 100.0 fL   MCH 23.9 (L) 26.0 - 34.0 pg   MCHC 32.6 30.0 - 36.0 g/dL   RDW 27.2 (H) 53.6 - 64.4 %   Platelets 199 150 - 400 K/uL   nRBC 0.0 0.0 - 0.2 %   Neutrophils Relative % 45 %   Neutro Abs 2.0 1.7 - 7.7 K/uL   Lymphocytes Relative 37 %   Lymphs Abs 1.6 0.7 - 4.0 K/uL   Monocytes Relative 15 %   Monocytes Absolute 0.6 0.1 - 1.0 K/uL   Eosinophils Relative 2 %   Eosinophils Absolute 0.1 0.0 - 0.5 K/uL   Basophils Relative 1 %   Basophils Absolute 0.0 0.0 - 0.1 K/uL   Immature Granulocytes 0 %   Abs Immature Granulocytes 0.00 0.00 - 0.07 K/uL    Comment: Performed at [facility], [address] [place], [place]     PHQ2/9: Depression screen [facility] 2/9 01/10/2021 11/29/2020 09/24/2020 06/25/2020 05/21/2020  Decreased Interest 0 0 0 0 0  Down, Depressed, Hopeless 0 0 0 0 0  PHQ - 2 Score 0 0 0 0 0  Altered sleeping - - - - -  Tired, decreased energy - - - - -   Change in appetite - - - - -  Feeling bad or failure about yourself  - - - - -  Trouble concentrating - - - - -  Moving slowly or fidgety/restless - - - - -  Suicidal thoughts - - - - -  PHQ-9 Score - - - - -  Difficult doing work/chores - - - - -    phq 9 is negative  Fall Risk: Fall Risk  01/10/2021 11/29/2020 09/24/2020 06/25/2020 05/21/2020  Falls in the past year? 0 0 0 0 0  Number falls in past yr: 0 0 0 0 0  Injury with Fall? 0 0 0 - 0  Follow up - Falls evaluation completed Falls evaluation completed Falls evaluation completed -      Functional Status Survey: Is the patient deaf or have difficulty hearing?: No Does the patient have difficulty seeing, even when wearing glasses/contacts?: Yes Does the patient have difficulty concentrating, remembering, or making decisions?: No Does the patient have difficulty walking or climbing stairs?: No Does the patient have difficulty dressing or bathing?: No Does the patient have difficulty doing errands alone such as visiting a doctor's office or shopping?: No    Assessment & Plan  1. Acute bacterial conjunctivitis of right eye  - ciprofloxacin (CILOXAN) 0.3 % ophthalmic solution; Place 1 drop into the right eye every 2 (two) hours. Administer 1 drop, every 2 hours, while awake, for 2 days. Then 1 drop, every 4 hours, while awake, for the next 5 days.  Dispense: 5 mL; Refill: 0  2. 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  Dispense: 2 each; Refill: 0   3. Blepharitis of upper and lower eyelids of both eyes, unspecified type  Use warm compresses , keep it clean, go to ophthalmologist if no improvement in a couple of days

## 2021-01-18 ENCOUNTER — Other Ambulatory Visit (HOSPITAL_BASED_OUTPATIENT_CLINIC_OR_DEPARTMENT_OTHER): Payer: Self-pay

## 2021-01-18 MED ORDER — NEOMYCIN-POLYMYXIN-DEXAMETH 3.5-10000-0.1 OP SUSP
OPHTHALMIC | 0 refills | Status: DC
  Filled 2021-01-18: qty 5, 14d supply, fill #0
  Filled 2021-01-19: qty 5, 25d supply, fill #0

## 2021-01-19 ENCOUNTER — Other Ambulatory Visit (HOSPITAL_COMMUNITY): Payer: Self-pay

## 2021-01-19 ENCOUNTER — Other Ambulatory Visit (HOSPITAL_BASED_OUTPATIENT_CLINIC_OR_DEPARTMENT_OTHER): Payer: Self-pay

## 2021-01-28 ENCOUNTER — Telehealth: Payer: Self-pay

## 2021-01-28 NOTE — Telephone Encounter (Signed)
Copied from CRM [id number]. Topic: General - Other >> Jan 28, 2021 11:05 AM Wyonia Hough E wrote: Reason for CRM: pt called due to getting a new job and wanting to see if her TB test and the Hep B from 2021 is still good or if she needs to get another annually/ if she this done again she will need a script for [facility] to have this done today asap / please advise

## 2021-01-28 NOTE — Telephone Encounter (Signed)
Pt notified we do not have these records

## 2021-02-07 ENCOUNTER — Telehealth: Payer: Self-pay

## 2021-02-07 NOTE — Telephone Encounter (Signed)
Debra Stephens would want her to schedule an appt.

## 2021-02-07 NOTE — Telephone Encounter (Signed)
Made patient appt with sowles on wed 02-09-2021. Pt requesting a wed appt

## 2021-02-07 NOTE — Telephone Encounter (Signed)
Copied from CRM [id number]. Topic: General - Other >> Feb 07, 2021  8:04 AM Jaquita Rector A wrote: Reason for CRM: Patient called in to inform Danelle Berry that her headaches have returned and she think it is from an elevated BP. Say that on the week of 01/27/21 she had her BP check and the BP was elevated. Say that she keep a constant headache. Asking for an increase in her BP medication please call patient. Please send Rx to [facility] Phone:  [phone]    Fax:  [phone]   Ph# [phone]

## 2021-02-08 ENCOUNTER — Ambulatory Visit: Payer: No Typology Code available for payment source | Admitting: Nurse Practitioner

## 2021-02-08 NOTE — Progress Notes (Signed)
Name: Debra Stephens   MRN: [id number]    DOB: Apr 11, 1971   Date:02/09/2021       Progress Note  Subjective  Chief Complaint  Headaches  HPI  HTN: patient has a history of HTN used to take Losartan hctz 50/12.5 mg however bp improved and was switched to 12.5 mg last year. She was doing well, however started a new job last week, doing a lot of classes online and noticed a daily headache. She had similar symptoms when she was first diagnosed with HTN. She took husbands half dose of losartan 100 mg on Sunday and headache resolved, she did it again Monday and felt well, tried to skip dose yesterday but headache returned. No focal deficit. 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   Vitamin D deficiency 11/22/2016   ANA positive 05/24/2016   H/O urticaria 09/08/2015   Angio-edema 09/08/2015    Past Surgical History:  Procedure Laterality Date   BREAST EXCISIONAL BIOPSY Left    age 33   COLONOSCOPY WITH PROPOFOL N/A 11/02/2017   Procedure: COLONOSCOPY WITH PROPOFOL;  Surgeon: Pasty Spillers, MD;  Location: [facility] ENDOSCOPY;  Service: Endoscopy;  Laterality: N/A;   TUBAL LIGATION     VAGINAL HYSTERECTOMY      Family History  Problem Relation Age of Onset   Stroke Mother    Hypertension Mother    Heart murmur Mother    Hypertension Father    Breast cancer Maternal Aunt    Ovarian cancer Maternal Aunt    Lupus Sister    Lupus Daughter    Liver cancer  Maternal Grandfather    Cirrhosis Maternal Grandfather    Pancreatic cancer Maternal Grandfather    Hypertension Paternal Grandmother    Sickle cell anemia Sister    Thyroid cancer Sister    Seizures Sister    Hashimoto's thyroiditis Sister    Fibromyalgia Sister     Social History   Tobacco Use   Smoking status: Never   Smokeless tobacco: Never  Substance Use Topics   Alcohol use: No     Current Outpatient Medications:    acetaminophen (TYLENOL) 325 MG tablet, Take 650 mg by mouth as needed., Disp: , Rfl:    ciprofloxacin (CILOXAN) 0.3 % ophthalmic solution, Administer 1 drop in affected eye(s) every 2 hours, while awake, for 2 days. Then 1 drop, every 4 hours, while awake, for the next 5 days., Disp: 5 mL, Rfl: 0   clobetasol (TEMOVATE) 0.05 % external solution, APPLY TO SCALP TWICE A DAY, Disp: 50 mL, Rfl: 1   EPINEPHrine (EPIPEN 2-PAK) 0.3 mg/0.3 mL IJ SOAJ injection, Inject 0.3 mg into the muscle as needed for anaphylaxis., Disp: 2 each, Rfl: 0   fexofenadine (ALLEGRA) 180 MG tablet, Take 180 mg by mouth daily., Disp: , Rfl:    hydrochlorothiazide (HYDRODIURIL) 12.5 MG tablet, Take 1 tablet (12.5 mg total) by mouth daily., Disp: 90 tablet, Rfl: 3   Multiple Vitamin (MULTIVITAMIN WITH MINERALS) TABS tablet, Take 1 tablet  by mouth daily., Disp: , Rfl:    neomycin-polymyxin b-dexamethasone (MAXITROL) 3.5-10000-0.1 SUSP, Apply 1 drop into the right eye three times a day., Disp: 5 mL, Rfl: 0   olopatadine (PATANOL) 0.1 % ophthalmic solution, Place 1 drop into both eyes 2 (two) times daily., Disp: 5 mL, Rfl: 2   triamcinolone (NASACORT) 55 MCG/ACT AERO nasal inhaler, Place 2 sprays into the nose daily., Disp: , Rfl:    Vitamin D, Ergocalciferol, (DRISDOL) 1.25 MG (50000 UNIT) CAPS capsule, Take 1 capsule (50,000 Units total) by mouth every 7 (seven) days., Disp: 4 capsule, Rfl: 2  Allergies  Allergen Reactions   Erythromycin Other (See Comments)   Ibuprofen Hives   Vibramycin  [Doxycycline Calcium] Hives    I personally reviewed active problem list, medication list, allergies, family history, social history, health maintenance with the patient/caregiver today.   ROS  Constitutional: Negative for fever or weight change.  Respiratory: Negative for cough and shortness of breath.   Cardiovascular: Negative for chest pain or palpitations.  Gastrointestinal: Negative for abdominal pain, no bowel changes.  Musculoskeletal: Negative for gait problem or joint swelling.  Skin: Negative for rash.  Neurological: Negative for dizziness , positive for  headache.  No other specific complaints in a complete review of systems (except as listed in HPI above).   Objective  Vitals:   02/09/21 1131  BP: 130/76  Pulse: 94  Resp: 16  Temp: 98 F (36.7 C)  SpO2: 99%  Weight: 209 lb (94.8 kg)  Height: 5\' 6"  (1.676 m)    Body mass index is 33.73 kg/m.  Physical Exam  Constitutional: Patient appears well-developed and well-nourished. Obese  No distress.  HEENT: head atraumatic, normocephalic, pupils equal and reactive to light, neck supple, right eye lid swollen ( angioedema)  Cardiovascular: Normal rate, regular rhythm and normal heart sounds.  No murmur heard. No BLE edema. Pulmonary/Chest: Effort normal and breath sounds normal. No respiratory distress. Abdominal: Soft.  There is no tenderness. Psychiatric: Patient has a normal mood and affect. behavior is normal. Judgment and thought content normal.   PHQ2/9: Depression screen [facility] 2/9 02/09/2021 01/10/2021 11/29/2020 09/24/2020 06/25/2020  Decreased Interest 0 0 0 0 0  Down, Depressed, Hopeless 0 0 0 0 0  PHQ - 2 Score 0 0 0 0 0  Altered sleeping - - - - -  Tired, decreased energy - - - - -  Change in appetite - - - - -  Feeling bad or failure about yourself  - - - - -  Trouble concentrating - - - - -  Moving slowly or fidgety/restless - - - - -  Suicidal thoughts - - - - -  PHQ-9 Score - - - - -  Difficult  doing work/chores - - - - -    phq 9 is negative   Fall Risk: Fall Risk  02/09/2021 01/10/2021 11/29/2020 09/24/2020 06/25/2020  Falls in the past year? 0 0 0 0 0  Number falls in past yr: 0 0 0 0 0  Injury with Fall? 0 0 0 0 -  Follow up - - Falls evaluation completed Falls evaluation completed Falls evaluation completed      Functional Status Survey: Is the patient deaf or have difficulty hearing?: No Does the patient have difficulty seeing, even when wearing glasses/contacts?: Yes Does the patient have difficulty concentrating, remembering, or making decisions?: No Does the patient have difficulty walking or climbing stairs?: No Does the patient have difficulty dressing or bathing?: No  Does the patient have difficulty doing errands alone such as visiting a doctor's office or shopping?: No    Assessment & Plan  1. Hypertension, benign  - losartan-hydrochlorothiazide (HYZAAR) 50-12.5 MG tablet; Take 1 tablet by mouth daily.  Dispense: 90 tablet; Refill: 1   Advised to only take medications prescribed to her

## 2021-02-09 ENCOUNTER — Encounter: Payer: Self-pay | Admitting: Family Medicine

## 2021-02-09 ENCOUNTER — Ambulatory Visit (INDEPENDENT_AMBULATORY_CARE_PROVIDER_SITE_OTHER): Payer: No Typology Code available for payment source | Admitting: Family Medicine

## 2021-02-09 ENCOUNTER — Other Ambulatory Visit: Payer: Self-pay

## 2021-02-09 VITALS — BP 130/76 | HR 94 | Temp 98.0°F | Resp 16 | Ht 66.0 in | Wt 209.0 lb

## 2021-02-09 DIAGNOSIS — Z23 Encounter for immunization: Secondary | ICD-10-CM | POA: Diagnosis not present

## 2021-02-09 DIAGNOSIS — I10 Essential (primary) hypertension: Secondary | ICD-10-CM | POA: Diagnosis not present

## 2021-02-09 MED ORDER — LOSARTAN POTASSIUM-HCTZ 50-12.5 MG PO TABS
1.0000 | ORAL_TABLET | Freq: Every day | ORAL | 1 refills | Status: DC
  Filled 2021-02-09: qty 90, 90d supply, fill #0

## 2021-03-10 ENCOUNTER — Ambulatory Visit (INDEPENDENT_AMBULATORY_CARE_PROVIDER_SITE_OTHER): Payer: No Typology Code available for payment source | Admitting: Family Medicine

## 2021-03-10 ENCOUNTER — Encounter: Payer: Self-pay | Admitting: Family Medicine

## 2021-03-10 DIAGNOSIS — Z0000 Encounter for general adult medical examination without abnormal findings: Secondary | ICD-10-CM

## 2021-03-10 DIAGNOSIS — E8881 Metabolic syndrome: Secondary | ICD-10-CM

## 2021-03-10 DIAGNOSIS — E785 Hyperlipidemia, unspecified: Secondary | ICD-10-CM

## 2021-03-10 DIAGNOSIS — I10 Essential (primary) hypertension: Secondary | ICD-10-CM

## 2021-03-10 DIAGNOSIS — Z5329 Procedure and treatment not carried out because of patient's decision for other reasons: Secondary | ICD-10-CM

## 2021-03-10 DIAGNOSIS — D582 Other hemoglobinopathies: Secondary | ICD-10-CM

## 2021-03-10 DIAGNOSIS — E538 Deficiency of other specified B group vitamins: Secondary | ICD-10-CM

## 2021-03-10 DIAGNOSIS — Z1231 Encounter for screening mammogram for malignant neoplasm of breast: Secondary | ICD-10-CM

## 2021-03-10 DIAGNOSIS — E559 Vitamin D deficiency, unspecified: Secondary | ICD-10-CM

## 2021-03-10 DIAGNOSIS — Z1159 Encounter for screening for other viral diseases: Secondary | ICD-10-CM

## 2021-03-10 DIAGNOSIS — E669 Obesity, unspecified: Secondary | ICD-10-CM

## 2021-03-10 NOTE — Patient Instructions (Signed)
Health Maintenance  Topic Date Due   Hepatitis C Screening: USPSTF Recommendation to screen - Ages 14-50 yo.  Never done   Zoster (Shingles) Vaccine (1 of 2) Never done   COVID-19 Vaccine (3 - Booster for Pfizer series) 02/20/2020   Mammogram  04/14/2021   Colon Cancer Screening  11/03/2027   Tetanus Vaccine  01/30/2028   Flu Shot  Completed   HIV Screening  Completed   HPV Vaccine  Aged Out   Pap Smear  Discontinued   [facility] [place],  [place]  [address] Get Driving Directions Main: [phone]  Preventive Care 9-47 Years Old, Female Preventive care refers to lifestyle choices and visits with your health care provider that can promote health and wellness. This includes: A yearly physical exam. This is also called an annual wellness visit. Regular dental and eye exams. Immunizations. Screening for certain conditions. Healthy lifestyle choices, such as: Eating a healthy diet. Getting regular exercise. Not using drugs or products that contain nicotine and tobacco. Limiting alcohol use. What can I expect for my preventive care visit? Physical exam Your health care provider will check your: Height and weight. These may be used to calculate your BMI (body mass index). BMI is a measurement that tells if you are at a healthy weight. Heart rate and blood pressure. Body temperature. Skin for abnormal spots. Counseling Your health care provider may ask you questions about your: Past medical problems. Family's medical history. Alcohol, tobacco, and drug use. Emotional well-being. Home life and relationship well-being. Sexual activity. Diet, exercise, and sleep habits. Work and work Statistician. Access to firearms. Method of birth control. Menstrual cycle. Pregnancy history. What immunizations do I need? Vaccines are usually given at various ages, according to a schedule. Your health care provider will recommend  vaccines for you based on your age, medical history, and lifestyle or other factors, such as travel or where you work. What tests do I need? Blood tests Lipid and cholesterol levels. These may be checked every 5 years, or more often if you are over 63 years old. Hepatitis C test. Hepatitis B test. Screening Lung cancer screening. You may have this screening every year starting at age 30 if you have a 30-pack-year history of smoking and currently smoke or have quit within the past 15 years. Colorectal cancer screening. All adults should have this screening starting at age 53 and continuing until age 40. Your health care provider may recommend screening at age 32 if you are at increased risk. You will have tests every 1-10 years, depending on your results and the type of screening test. Diabetes screening. This is done by checking your blood sugar (glucose) after you have not eaten for a while (fasting). You may have this done every 1-3 years. Mammogram. This may be done every 1-2 years. Talk with your health care provider about when you should start having regular mammograms. This may depend on whether you have a family history of breast cancer. BRCA-related cancer screening. This may be done if you have a family history of breast, ovarian, tubal, or peritoneal cancers. Pelvic exam and Pap test. This may be done every 3 years starting at age 2. Starting at age 69, this may be done every 5 years if you have a Pap test in combination with an HPV test. Other tests STD (sexually transmitted disease) testing, if you are at risk. Bone density scan. This is done to screen for osteoporosis. You may  have this scan if you are at high risk for osteoporosis. Talk with your health care provider about your test results, treatment options, and if necessary, the need for more tests. Follow these instructions at home: Eating and drinking  Eat a diet that includes fresh fruits and vegetables, whole  grains, lean protein, and low-fat dairy products. Take vitamin and mineral supplements as recommended by your health care provider. Do not drink alcohol if: Your health care provider tells you not to drink. You are pregnant, may be pregnant, or are planning to become pregnant. If you drink alcohol: Limit how much you have to 0-1 drink a day. Be aware of how much alcohol is in your drink. In the [place] one drink equals one 12 oz bottle of beer (355 mL), one 5 oz glass of wine (148 mL), or one 1 oz glass of hard liquor (44 mL). Lifestyle Take daily care of your teeth and gums. Brush your teeth every morning and night with fluoride toothpaste. Floss one time each day. Stay active. Exercise for at least 30 minutes 5 or more days each week. Do not use any products that contain nicotine or tobacco, such as cigarettes, e-cigarettes, and chewing tobacco. If you need help quitting, ask your health care provider. Do not use drugs. If you are sexually active, practice safe sex. Use a condom or other form of protection to prevent STIs (sexually transmitted infections). If you do not wish to become pregnant, use a form of birth control. If you plan to become pregnant, see your health care provider for a prepregnancy visit. If told by your health care provider, take low-dose aspirin daily starting at age 49. Find healthy ways to cope with stress, such as: Meditation, yoga, or listening to music. Journaling. Talking to a trusted person. Spending time with friends and family. Safety Always wear your seat belt while driving or riding in a vehicle. Do not drive: If you have been drinking alcohol. Do not ride with someone who has been drinking. When you are tired or distracted. While texting. Wear a helmet and other protective equipment during sports activities. If you have firearms in your house, make sure you follow all gun safety procedures. What's next? Visit your health care provider once a year for  an annual wellness visit. Ask your health care provider how often you should have your eyes and teeth checked. Stay up to date on all vaccines. This information is not intended to replace advice given to you by your health care provider. Make sure you discuss any questions you have with your health care provider. Document Revised: 08/06/2020 Document Reviewed: 02/07/2018 Elsevier Patient Education  2022 [employer].

## 2021-03-10 NOTE — Progress Notes (Signed)
Patient: Debra Stephens, Female    DOB: 06/25/1970, 50 y.o.   MRN: [id number] Debra Grana, PA-C Visit Date: 03/10/2021  Today's Provider: Delsa Grana, PA-C   No chief complaint on file.   Pt no show for appt - labs signed for if and when she reschedules   Subjective:   Annual physical exam:  Debra Stephens is a 50 y.o. female who presents today for complete physical exam:  Exercise/Activity: Diet/nutrition: Sleep:   SDOH Screenings   Alcohol Screen: Low Risk    Last Alcohol Screening Score (AUDIT): 0  Depression (PHQ2-9): Low Risk    PHQ-2 Score: 0  Financial Resource Strain: Not on file  Food Insecurity: Not on file  Housing: Not on file  Physical Activity: Not on file  Social Connections: Not on file  Stress: Not on file  Tobacco Use: Low Risk    Smoking Tobacco Use: Never   Smokeless Tobacco Use: Never  Transportation Needs: Not on file    HTN - on losartan HCTZ BP Readings from Last 3 Encounters:  02/09/21 130/76  01/10/21 138/86  11/29/20 120/72   HLD not on meds The 10-year ASCVD risk score (Arnett DK, et al., 2019) is: 3.7%   Values used to calculate the score:     Age: 59 years     Sex: Female     Is Non-Hispanic African American: Yes     Diabetic: No     Tobacco smoker: No     Systolic Blood Pressure: 646 mmHg     Is BP treated: Yes     HDL Cholesterol: 53 mg/dL     Total Cholesterol: 190 mg/dL  Vit D deficiency and B 12 deficiency   USPSTF grade A and B recommendations - reviewed and addressed today  Depression:  Phq 9 completed today by patient, was reviewed by me with patient in the room PHQ score is , pt feels  PHQ 2/9 Scores 02/09/2021 01/10/2021 11/29/2020 09/24/2020  PHQ - 2 Score 0 0 0 0  PHQ- 9 Score - - - -   Depression screen [facility] 2/9 02/09/2021 01/10/2021 11/29/2020 09/24/2020 06/25/2020  Decreased Interest 0 0 0 0 0  Down, Depressed, Hopeless 0 0 0 0 0  PHQ - 2 Score 0 0 0 0 0  Altered sleeping - - - - -  Tired, decreased  energy - - - - -  Change in appetite - - - - -  Feeling bad or failure about yourself  - - - - -  Trouble concentrating - - - - -  Moving slowly or fidgety/restless - - - - -  Suicidal thoughts - - - - -  PHQ-9 Score - - - - -  Difficult doing work/chores - - - - -    Alcohol screening: [place] Office Visit from 11/29/2020 in [facility]  AUDIT-C Score 0       Immunizations and Health Maintenance: Health Maintenance  Topic Date Due   Hepatitis C Screening  Never done   Zoster Vaccines- Shingrix (1 of 2) Never done   COVID-19 Vaccine (3 - Booster for [place] series) 02/20/2020   MAMMOGRAM  04/14/2021   COLONOSCOPY (Pts 45-77yr Insurance coverage will need to be confirmed)  11/03/2027   TETANUS/TDAP  01/30/2028   INFLUENZA VACCINE  Completed   HIV Screening  Completed   HPV VACCINES  Aged Out   PAP SMEAR-Modifier  Discontinued     Hep C Screening: due  STD testing and prevention (HIV/chl/gon/syphilis):  see above, no additional testing desired by pt today  Intimate partner violence:  Sexual History/Pain during Intercourse: Married  Menstrual History/LMP/Abnormal Bleeding:  No LMP recorded. Patient has had a hysterectomy.  Incontinence Symptoms:   Breast cancer: due nov already ordered Last Mammogram: *see HM list above BRCA gene screening:   Cervical cancer screening: n/a vaginal hysterectomy  Pt  family hx of cancers - breast, ovarian, uterine, colon:     Osteoporosis:   Discussion on osteoporosis per age, including high calcium and vitamin D supplementation, weight bearing exercises Pt is  supplementing with daily calcium/Vit D.  Bone scan/dexa Roughly experienced menopause at age   Skin cancer:  Hx of skin CA -  NO Discussed atypical lesions   Colorectal cancer:   Colonoscopy is UTD Discussed concerning signs and sx of CRC, pt denies   Lung cancer:   Low Dose CT Chest recommended if Age 44-80 years, 20 pack-year currently  smoking OR have quit w/in 15years. Patient does not qualify.    Social History   Tobacco Use   Smoking status: Never   Smokeless tobacco: Never  Vaping Use   Vaping Use: Never used  Substance Use Topics   Alcohol use: No   Drug use: No     Flowsheet Row Office Visit from 11/29/2020 in [facility]  AUDIT-C Score 0       Family History  Problem Relation Age of Onset   Stroke Mother    Hypertension Mother    Heart murmur Mother    Hypertension Father    Breast cancer Maternal Aunt    Ovarian cancer Maternal Aunt    Lupus Sister    Lupus Daughter    Liver cancer Maternal Grandfather    Cirrhosis Maternal Grandfather    Pancreatic cancer Maternal Grandfather    Hypertension Paternal Grandmother    Sickle cell anemia Sister    Thyroid cancer Sister    Seizures Sister    Hashimoto's thyroiditis Sister    Fibromyalgia Sister      Blood pressure/Hypertension: BP Readings from Last 3 Encounters:  02/09/21 130/76  01/10/21 138/86  11/29/20 120/72    Weight/Obesity: Wt Readings from Last 3 Encounters:  02/09/21 209 lb (94.8 kg)  01/10/21 215 lb (97.5 kg)  11/29/20 211 lb 8 oz (95.9 kg)   BMI Readings from Last 3 Encounters:  02/09/21 33.73 kg/m  01/10/21 34.70 kg/m  11/29/20 34.14 kg/m     Lipids:  Lab Results  Component Value Date   CHOL 190 03/09/2020   CHOL 192 07/27/2017   Lab Results  Component Value Date   HDL 53 03/09/2020   HDL 54 07/27/2017   Lab Results  Component Value Date   LDLCALC 122 (H) 03/09/2020   LDLCALC 123 (H) 07/27/2017   Lab Results  Component Value Date   TRIG 81 03/09/2020   TRIG 55 07/27/2017   Lab Results  Component Value Date   CHOLHDL 3.6 03/09/2020   CHOLHDL 3.6 07/27/2017   No results found for: LDLDIRECT Based on the results of lipid panel his/her cardiovascular risk factor ( using [place] )  in the next 10 years is: The 10-year ASCVD risk score (Arnett DK, et al., 2019) is: 3.7%    Values used to calculate the score:     Age: 44 years     Sex: Female     Is Non-Hispanic African American: Yes     Diabetic: No  Tobacco smoker: No     Systolic Blood Pressure: 397 mmHg     Is BP treated: Yes     HDL Cholesterol: 53 mg/dL     Total Cholesterol: 190 mg/dL  Glucose:  Glucose  Date Value Ref Range Status  05/13/2013 96 65 - 99 mg/dL Final  10/11/2012 96 65 - 99 mg/dL Final  09/21/2011 75 65 - 99 mg/dL Final   Glucose, Bld  Date Value Ref Range Status  06/25/2020 84 65 - 99 mg/dL Final    Comment:    .            Fasting reference interval .   09/28/2017 104 (H) 65 - 99 mg/dL Final  07/27/2017 79 65 - 99 mg/dL Final    Comment:    .            Fasting reference interval .     Advanced Care Planning:  A voluntary discussion about advance care planning including the explanation and discussion of advance directives.   Discussed health care proxy and Living will, and the patient was able to identify a health care proxy as .   Patient have a living will at present time.   Social History       Social History   Socioeconomic History   Marital status: Married    Spouse name: Not on file   Number of children: Not on file   Years of education: Not on file   Highest education level: Not on file  Occupational History   Not on file  Tobacco Use   Smoking status: Never   Smokeless tobacco: Never  Vaping Use   Vaping Use: Never used  Substance and Sexual Activity   Alcohol use: No   Drug use: No   Sexual activity: Yes  Other Topics Concern   Not on file  Social History Narrative   Not on file   Social Determinants of Health   Financial Resource Strain: Not on file  Food Insecurity: Not on file  Transportation Needs: Not on file  Physical Activity: Not on file  Stress: Not on file  Social Connections: Not on file    Family History        Family History  Problem Relation Age of Onset   Stroke Mother    Hypertension Mother    Heart  murmur Mother    Hypertension Father    Breast cancer Maternal Aunt    Ovarian cancer Maternal Aunt    Lupus Sister    Lupus Daughter    Liver cancer Maternal Grandfather    Cirrhosis Maternal Grandfather    Pancreatic cancer Maternal Grandfather    Hypertension Paternal Grandmother    Sickle cell anemia Sister    Thyroid cancer Sister    Seizures Sister    Hashimoto's thyroiditis Sister    Fibromyalgia Sister     Patient Active Problem List   Diagnosis Date Noted   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   Hypertension, benign 04/22/2020   Vitamin B12 deficiency 01/29/2018   Recurrent sinusitis 67/34/1937   Dysmetabolic syndrome 90/24/0973   Chronic idiopathic constipation 08/13/2017   Obesity (BMI 30-39.9) 07/27/2017   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   Vitamin D deficiency 11/22/2016   ANA positive 05/24/2016   H/O urticaria 09/08/2015   Angio-edema 09/08/2015    Past Surgical History:  Procedure Laterality Date   BREAST EXCISIONAL BIOPSY Left    age 84   COLONOSCOPY WITH PROPOFOL N/A 11/02/2017  Procedure: COLONOSCOPY WITH PROPOFOL;  Surgeon: Virgel Manifold, MD;  Location: [facility] ENDOSCOPY;  Service: Endoscopy;  Laterality: N/A;   TUBAL LIGATION     VAGINAL HYSTERECTOMY       Current Outpatient Medications:    acetaminophen (TYLENOL) 325 MG tablet, Take 650 mg by mouth as needed., Disp: , Rfl:    clobetasol (TEMOVATE) 0.05 % external solution, APPLY TO SCALP TWICE A DAY, Disp: 50 mL, Rfl: 1   EPINEPHrine (EPIPEN 2-PAK) 0.3 mg/0.3 mL IJ SOAJ injection, Inject 0.3 mg into the muscle as needed for anaphylaxis., Disp: 2 each, Rfl: 0   fexofenadine (ALLEGRA) 180 MG tablet, Take 180 mg by mouth daily., Disp: , Rfl:    losartan-hydrochlorothiazide (HYZAAR) 50-12.5 MG tablet, Take 1 tablet by mouth daily., Disp: 90 tablet, Rfl: 1   Multiple Vitamin (MULTIVITAMIN WITH MINERALS) TABS tablet, Take 1 tablet by mouth daily., Disp: , Rfl:    neomycin-polymyxin  b-dexamethasone (MAXITROL) 3.5-10000-0.1 SUSP, Apply 1 drop into the right eye three times a day., Disp: 5 mL, Rfl: 0   olopatadine (PATANOL) 0.1 % ophthalmic solution, Place 1 drop into both eyes 2 (two) times daily., Disp: 5 mL, Rfl: 2   triamcinolone (NASACORT) 55 MCG/ACT AERO nasal inhaler, Place 2 sprays into the nose daily., Disp: , Rfl:    Vitamin D, Ergocalciferol, (DRISDOL) 1.25 MG (50000 UNIT) CAPS capsule, Take 1 capsule (50,000 Units total) by mouth every 7 (seven) days., Disp: 4 capsule, Rfl: 2  Allergies  Allergen Reactions   Erythromycin Other (See Comments)   Ibuprofen Hives   Vibramycin [Doxycycline Calcium] Hives    Patient Care Team: Debra Grana, PA-C as PCP - General (Family Medicine) Rico Junker, RN as Registered Nurse Theodore Demark, RN as Registered Nurse   Chart Review: I personally reviewed active problem list, medication list, allergies, family history, social history, health maintenance, notes from last encounter, lab results, imaging with the patient/caregiver today.   Review of Systems        Objective:   Vitals:  There were no vitals filed for this visit.  There is no height or weight on file to calculate BMI.  Physical Exam    Fall Risk: Fall Risk  02/09/2021 01/10/2021 11/29/2020 09/24/2020 06/25/2020  Falls in the past year? 0 0 0 0 0  Number falls in past yr: 0 0 0 0 0  Injury with Fall? 0 0 0 0 -  Follow up - - Falls evaluation completed Falls evaluation completed Falls evaluation completed    Functional Status Survey:     Assessment & Plan:    CPE completed today  USPSTF grade A and B recommendations reviewed with patient; age-appropriate recommendations, preventive care, screening tests, etc discussed and encouraged; healthy living encouraged; see AVS for patient education given to patient  Discussed importance of 150 minutes of physical activity weekly, AHA exercise recommendations given to pt in AVS/handout  Discussed  importance of healthy diet:  eating lean meats and proteins, avoiding trans fats and saturated fats, avoid simple sugars and excessive carbs in diet, eat 6 servings of fruit/vegetables daily and drink plenty of water and avoid sweet beverages.    Recommended pt to do annual eye exam and routine dental exams/cleanings  Depression, alcohol, fall screening completed as documented above and per flowsheets  Advance Care planning information and packet discussed and offered today, encouraged pt to discuss with family members/spouse/partner/friends and complete Advanced directive packet and bring copy to office   Reviewed Health Maintenance:  Health Maintenance  Topic Date Due   Hepatitis C Screening  Never done   Zoster Vaccines- Shingrix (1 of 2) Never done   COVID-19 Vaccine (3 - Booster for [employer] series) 02/20/2020   MAMMOGRAM  04/14/2021   COLONOSCOPY (Pts 45-50yr Insurance coverage will need to be confirmed)  11/03/2027   TETANUS/TDAP  01/30/2028   INFLUENZA VACCINE  Completed   HIV Screening  Completed   HPV VACCINES  Aged Out   PAP SMEAR-Modifier  Discontinued    Immunizations: Immunization History  Administered Date(s) Administered   Influenza,inj,Quad PF,6+ Mos 07/27/2017, 01/29/2018, 03/09/2020, 02/09/2021   PFIZER(Purple Top)SARS-COV-2 Vaccination 08/30/2019, 09/20/2019   Tdap 01/29/2018   Vaccines:  HPV: up to at age 797, ask insurance if age between 262-45 Shingrix: 531-64yo and ask insurance if covered when patient above 631yo Pneumonia: n/a Flu:  educated and discussed with patient. COVID:      ICD-10-CM   1. Annual physical exam  Z00.00     2. Hyperlipidemia, unspecified hyperlipidemia type  E78.5    last ldl 122    3. Dysmetabolic syndrome  EY38.36    4. Obesity (BMI 30-39.9)  E66.9     5. Encounter for hepatitis C screening test for low risk patient  Z11.59     6. Vitamin D deficiency  E55.9    Rx supplements from GYN    7. Hypertension, benign  I10      8. Vitamin B12 deficiency  E53.8    supplemented, recheck    9. 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 Encounter for screening mammogram for malignant neoplasm of breast  Z12.31    previously ordered, due in a few months          LDelsa Grana PA-C 03/10/21 3:02 PM  [place]

## 2021-03-14 ENCOUNTER — Encounter: Payer: Self-pay | Admitting: Family Medicine

## 2021-03-14 ENCOUNTER — Ambulatory Visit (INDEPENDENT_AMBULATORY_CARE_PROVIDER_SITE_OTHER): Payer: 59 | Admitting: Family Medicine

## 2021-03-14 ENCOUNTER — Other Ambulatory Visit: Payer: Self-pay

## 2021-03-14 VITALS — BP 126/78 | HR 92 | Temp 98.0°F | Resp 16 | Ht 66.0 in | Wt 211.4 lb

## 2021-03-14 DIAGNOSIS — E559 Vitamin D deficiency, unspecified: Secondary | ICD-10-CM

## 2021-03-14 DIAGNOSIS — R5383 Other fatigue: Secondary | ICD-10-CM

## 2021-03-14 DIAGNOSIS — Z0000 Encounter for general adult medical examination without abnormal findings: Secondary | ICD-10-CM

## 2021-03-14 DIAGNOSIS — E8881 Metabolic syndrome: Secondary | ICD-10-CM

## 2021-03-14 DIAGNOSIS — E663 Overweight: Secondary | ICD-10-CM

## 2021-03-14 DIAGNOSIS — T148XXA Other injury of unspecified body region, initial encounter: Secondary | ICD-10-CM

## 2021-03-14 DIAGNOSIS — I10 Essential (primary) hypertension: Secondary | ICD-10-CM

## 2021-03-14 DIAGNOSIS — E538 Deficiency of other specified B group vitamins: Secondary | ICD-10-CM

## 2021-03-14 DIAGNOSIS — E669 Obesity, unspecified: Secondary | ICD-10-CM

## 2021-03-14 DIAGNOSIS — D582 Other hemoglobinopathies: Secondary | ICD-10-CM

## 2021-03-14 NOTE — Patient Instructions (Signed)
Preventive Care 40-50 Years Old, Female Preventive care refers to lifestyle choices and visits with your health care provider that can promote health and wellness. This includes: A yearly physical exam. This is also called an annual wellness visit. Regular dental and eye exams. Immunizations. Screening for certain conditions. Healthy lifestyle choices, such as: Eating a healthy diet. Getting regular exercise. Not using drugs or products that contain nicotine and tobacco. Limiting alcohol use. What can I expect for my preventive care visit? Physical exam Your health care provider will check your: Height and weight. These may be used to calculate your BMI (body mass index). BMI is a measurement that tells if you are at a healthy weight. Heart rate and blood pressure. Body temperature. Skin for abnormal spots. Counseling Your health care provider may ask you questions about your: Past medical problems. Family's medical history. Alcohol, tobacco, and drug use. Emotional well-being. Home life and relationship well-being. Sexual activity. Diet, exercise, and sleep habits. Work and work environment. Access to firearms. Method of birth control. Menstrual cycle. Pregnancy history. What immunizations do I need? Vaccines are usually given at various ages, according to a schedule. Your health care provider will recommend vaccines for you based on your age, medical history, and lifestyle or other factors, such as travel or where you work. What tests do I need? Blood tests Lipid and cholesterol levels. These may be checked every 5 years, or more often if you are over 50 years old. Hepatitis C test. Hepatitis B test. Screening Lung cancer screening. You may have this screening every year starting at age 55 if you have a 30-pack-year history of smoking and currently smoke or have quit within the past 15 years. Colorectal cancer screening. All adults should have this screening starting at  age 50 and continuing until age 75. Your health care provider may recommend screening at age 45 if you are at increased risk. You will have tests every 1-10 years, depending on your results and the type of screening test. Diabetes screening. This is done by checking your blood sugar (glucose) after you have not eaten for a while (fasting). You may have this done every 1-3 years. Mammogram. This may be done every 1-2 years. Talk with your health care provider about when you should start having regular mammograms. This may depend on whether you have a family history of breast cancer. BRCA-related cancer screening. This may be done if you have a family history of breast, ovarian, tubal, or peritoneal cancers. Pelvic exam and Pap test. This may be done every 3 years starting at age 21. Starting at age 30, this may be done every 5 years if you have a Pap test in combination with an HPV test. Other tests STD (sexually transmitted disease) testing, if you are at risk. Bone density scan. This is done to screen for osteoporosis. You may have this scan if you are at high risk for osteoporosis. Talk with your health care provider about your test results, treatment options, and if necessary, the need for more tests. Follow these instructions at home: Eating and drinking  Eat a diet that includes fresh fruits and vegetables, whole grains, lean protein, and low-fat dairy products. Take vitamin and mineral supplements as recommended by your health care provider. Do not drink alcohol if: Your health care provider tells you not to drink. You are pregnant, may be pregnant, or are planning to become pregnant. If you drink alcohol: Limit how much you have to 0-1 drink a day. Be   aware of how much alcohol is in your drink. In the [place] one drink equals one 12 oz bottle of beer (355 mL), one 5 oz glass of wine (148 mL), or one 1 oz glass of hard liquor (44 mL). Lifestyle Take daily care of your teeth and  gums. Brush your teeth every morning and night with fluoride toothpaste. Floss one time each day. Stay active. Exercise for at least 30 minutes 5 or more days each week. Do not use any products that contain nicotine or tobacco, such as cigarettes, e-cigarettes, and chewing tobacco. If you need help quitting, ask your health care provider. Do not use drugs. If you are sexually active, practice safe sex. Use a condom or other form of protection to prevent STIs (sexually transmitted infections). If you do not wish to become pregnant, use a form of birth control. If you plan to become pregnant, see your health care provider for a prepregnancy visit. If told by your health care provider, take low-dose aspirin daily starting at age 63. Find healthy ways to cope with stress, such as: Meditation, yoga, or listening to music. Journaling. Talking to a trusted person. Spending time with friends and family. Safety Always wear your seat belt while driving or riding in a vehicle. Do not drive: If you have been drinking alcohol. Do not ride with someone who has been drinking. When you are tired or distracted. While texting. Wear a helmet and other protective equipment during sports activities. If you have firearms in your house, make sure you follow all gun safety procedures. What's next? Visit your health care provider once a year for an annual wellness visit. Ask your health care provider how often you should have your eyes and teeth checked. Stay up to date on all vaccines. This information is not intended to replace advice given to you by your health care provider. Make sure you discuss any questions you have with your health care provider. Document Revised: 08/06/2020 Document Reviewed: 02/07/2018 Elsevier Patient Education  2022 [employer].

## 2021-03-14 NOTE — Progress Notes (Signed)
BP 126/78   Pulse 92   Temp 98 F (36.7 C) (Oral)   Resp 16   Ht 5\' 6"  (1.676 m)   Wt 211 lb 6.4 oz (95.9 kg)   SpO2 99%   BMI 34.12 kg/m    Subjective:    Patient ID: , female    DOB: 1970-11-11, 50 y.o.   MRN: [id number]  HPI: Debra Stephens is a 51 y.o. female presenting on 03/14/2021 for comprehensive medical examination. Current medical complaints include:none  Hypertension: - Medications: losartan-HCTZ - Compliance: usually one missed dose per week - Checking BP at home: yes, 120s SBP - Denies any LE edema, medication SEs, or symptoms of hypotension  Easy bruising - reports easy bruising when hitting something. wants CBC, ferritin checked. No prior h/o clotting d/o. Prior abnormal ANA with f/u testing wnl. H/o Hb C with microcytosis with prior w/u via Heme/Onc. No prior transfusions.  Menopausal symptoms - hot flashes, sweating, mood swings. S/p hysterectomy.   She currently lives with: husband Menopausal Symptoms: yes  Depression Screen done today and results listed below:  Depression screen [facility] 2/9 03/14/2021 02/09/2021 01/10/2021 11/29/2020 09/24/2020  Decreased Interest 0 0 0 0 0  Down, Depressed, Hopeless 0 0 0 0 0  PHQ - 2 Score 0 0 0 0 0  Altered sleeping - - - - -  Tired, decreased energy - - - - -  Change in appetite - - - - -  Feeling bad or failure about yourself  - - - - -  Trouble concentrating - - - - -  Moving slowly or fidgety/restless - - - - -  Suicidal thoughts - - - - -  PHQ-9 Score - - - - -  Difficult doing work/chores - - - - -    Past Medical History:  Past Medical History:  Diagnosis Date   Abdominal tenderness, right upper quadrant    Abnormal Pap smear of cervix    Allergic rhinitis, cause unspecified    Anemia    Atypical chest pain 06/24/2013   Chronic headaches    Cough    Family history of breast cancer    Family history of pancreatic cancer    9/21 cancer genetic testing letter sent   Lumbago    Other  and unspecified hyperlipidemia    Swelling    Thoracic or lumbosacral neuritis or radiculitis, unspecified    Unspecified symptom associated with female genital organs    Unspecified vitamin D deficiency     Surgical History:  Past Surgical History:  Procedure Laterality Date   BREAST EXCISIONAL BIOPSY Left    age 48   COLONOSCOPY WITH PROPOFOL N/A 11/02/2017   Procedure: COLONOSCOPY WITH PROPOFOL;  Surgeon: 11/04/2017, MD;  Location: [facility] ENDOSCOPY;  Service: Endoscopy;  Laterality: N/A;   TUBAL LIGATION     VAGINAL HYSTERECTOMY      Medications:  Current Outpatient Medications on File Prior to Visit  Medication Sig   acetaminophen (TYLENOL) 325 MG tablet Take 650 mg by mouth as needed.   clobetasol (TEMOVATE) 0.05 % external solution APPLY TO SCALP TWICE A DAY   EPINEPHrine (EPIPEN 2-PAK) 0.3 mg/0.3 mL IJ SOAJ injection Inject 0.3 mg into the muscle as needed for anaphylaxis.   fexofenadine (ALLEGRA) 180 MG tablet Take 180 mg by mouth daily.   losartan-hydrochlorothiazide (HYZAAR) 50-12.5 MG tablet Take 1 tablet by mouth daily.   Multiple Vitamin (MULTIVITAMIN WITH MINERALS) TABS tablet Take 1 tablet by  mouth daily.   neomycin-polymyxin b-dexamethasone (MAXITROL) 3.5-10000-0.1 SUSP Apply 1 drop into the right eye three times a day.   olopatadine (PATANOL) 0.1 % ophthalmic solution Place 1 drop into both eyes 2 (two) times daily.   triamcinolone (NASACORT) 55 MCG/ACT AERO nasal inhaler Place 2 sprays into the nose daily.   Vitamin D, Ergocalciferol, (DRISDOL) 1.25 MG (50000 UNIT) CAPS capsule Take 1 capsule (50,000 Units total) by mouth every 7 (seven) days.   [DISCONTINUED] phentermine (ADIPEX-P) 37.5 MG tablet Take 1/2 tab po with breakfast daily, hold if BP >140/80   No current facility-administered medications on file prior to visit.    Allergies:  Allergies  Allergen Reactions   Erythromycin Other (See Comments)   Ibuprofen Hives   Vibramycin [Doxycycline  Calcium] Hives    Social History:  Social History   Socioeconomic History   Marital status: Married    Spouse name: Not on file   Number of children: Not on file   Years of education: Not on file   Highest education level: Not on file  Occupational History   Not on file  Tobacco Use   Smoking status: Never   Smokeless tobacco: Never  Vaping Use   Vaping Use: Never used  Substance and Sexual Activity   Alcohol use: No   Drug use: No   Sexual activity: Yes  Other Topics Concern   Not on file  Social History Narrative   Not on file   Social Determinants of Health   Financial Resource Strain: Low Risk    Difficulty of Paying Living Expenses: Not hard at all  Food Insecurity: No Food Insecurity   Worried About Programme researcher, broadcasting/film/video in the Last Year: Never true   Ran Out of Food in the Last Year: Never true  Transportation Needs: No Transportation Needs   Lack of Transportation (Medical): No   Lack of Transportation (Non-Medical): No  Physical Activity: Insufficiently Active   Days of Exercise per Week: 1 day   Minutes of Exercise per Session: 20 min  Stress: No Stress Concern Present   Feeling of Stress : Only a little  Social Connections: Press photographer of Communication with Friends and Family: More than three times a week   Frequency of Social Gatherings with Friends and Family: Once a week   Attends Religious Services: 1 to 4 times per year   Active Member of [employer] or Organizations: Yes   Attends Engineer, structural: More than 4 times per year   Marital Status: Married  Catering manager Violence: Not At Risk   Fear of Current or Ex-Partner: No   Emotionally Abused: No   Physically Abused: No   Sexually Abused: No   Social History   Tobacco Use  Smoking Status Never  Smokeless Tobacco Never   Social History   Substance and Sexual Activity  Alcohol Use No    Family History:  Family History  Problem Relation Age of Onset    Stroke Mother    Hypertension Mother    Heart murmur Mother    Hypertension Father    Breast cancer Maternal Aunt    Ovarian cancer Maternal Aunt    Lupus Sister    Lupus Daughter    Liver cancer Maternal Grandfather    Cirrhosis Maternal Grandfather    Pancreatic cancer Maternal Grandfather    Hypertension Paternal Grandmother    Sickle cell anemia Sister    Thyroid cancer Sister    Seizures Sister  Hashimoto's thyroiditis Sister    Fibromyalgia Sister     Past medical history, surgical history, medications, allergies, family history and social history reviewed with patient today and changes made to appropriate areas of the chart.      Objective:    BP 126/78   Pulse 92   Temp 98 F (36.7 C) (Oral)   Resp 16   Ht 5\' 6"  (1.676 m)   Wt 211 lb 6.4 oz (95.9 kg)   SpO2 99%   BMI 34.12 kg/m   Wt Readings from Last 3 Encounters:  03/14/21 211 lb 6.4 oz (95.9 kg)  02/09/21 209 lb (94.8 kg)  01/10/21 215 lb (97.5 kg)    Physical Exam Vitals reviewed.  Constitutional:      General: She is not in acute distress.    Appearance: Normal appearance.  HENT:     Head: Normocephalic.     Right Ear: External ear normal.     Left Ear: External ear normal.  Eyes:     Extraocular Movements: Extraocular movements intact.  Cardiovascular:     Rate and Rhythm: Normal rate and regular rhythm.     Heart sounds: Normal heart sounds. No murmur heard. Pulmonary:     Effort: Pulmonary effort is normal.     Breath sounds: Normal breath sounds.  Musculoskeletal:        General: Normal range of motion.     Right lower leg: No edema.     Left lower leg: No edema.  Skin:    General: Skin is warm and dry.  Neurological:     Mental Status: She is alert and oriented to person, place, and time. Mental status is at baseline.  Psychiatric:        Mood and Affect: Mood normal.        Behavior: Behavior normal.    Results for orders placed or performed in visit on 10/13/20  CBC with  Differential/Platelet  Result Value Ref Range   WBC 4.3 4.0 - 10.5 K/uL   RBC 5.22 (H) 3.87 - 5.11 MIL/uL   Hemoglobin 12.5 12.0 - 15.0 g/dL   HCT 12/13/20 54.6 - 27.0 %   MCV 73.6 (L) 80.0 - 100.0 fL   MCH 23.9 (L) 26.0 - 34.0 pg   MCHC 32.6 30.0 - 36.0 g/dL   RDW 35.0 (H) 09.3 - 81.8 %   Platelets 199 150 - 400 K/uL   nRBC 0.0 0.0 - 0.2 %   Neutrophils Relative % 45 %   Neutro Abs 2.0 1.7 - 7.7 K/uL   Lymphocytes Relative 37 %   Lymphs Abs 1.6 0.7 - 4.0 K/uL   Monocytes Relative 15 %   Monocytes Absolute 0.6 0.1 - 1.0 K/uL   Eosinophils Relative 2 %   Eosinophils Absolute 0.1 0.0 - 0.5 K/uL   Basophils Relative 1 %   Basophils Absolute 0.0 0.0 - 0.1 K/uL   Immature Granulocytes 0 %   Abs Immature Granulocytes 0.00 0.00 - 0.07 K/uL      Assessment & Plan:   Problem List Items Addressed This Visit       Cardiovascular and Mediastinum   Hypertension, benign    Doing well on current regimen, no changes made today. Recheck labs.        Other   Obesity (BMI 30-39.9)   Dysmetabolic syndrome   Vitamin D deficiency   Vitamin B12 deficiency    Recheck levels      Hemoglobin C (Hb-C) (HCC)  Other Visit Diagnoses     Bruising    -  Primary   Relevant Orders   Ferritin   Fatigue, unspecified type       Relevant Orders   Thyroid Panel With TSH   Overweight       Relevant Orders   Hemoglobin A1c       Follow up plan: Return in about 6 months (around 09/12/2021).   LABORATORY TESTING:  - Pap smear: done elsewhere  IMMUNIZATIONS:   - Tdap: Tetanus vaccination status reviewed: last tetanus booster within 10 years. - Influenza: Up to date - Pneumovax: Not applicable - Prevnar: Not applicable - HPV: Not applicable - Shingrix vaccine: Refused - COVID vaccine: has received 2 doses of mRNA vaccine  SCREENING: -Mammogram:  ordered   - Colonoscopy: Up to date  - Bone Density: Not applicable  - Lung Cancer Screening: Not applicable   Hep C Screening: due STD  testing and prevention (HIV/chl/gon/syphilis): UTD Menstrual History/LMP/Abnormal Bleeding: s/p hysterectomy  Osteoporosis: Discussed high calcium and vitamin D supplementation, weight bearing exercises  PATIENT COUNSELING:   Advised to take 1 mg of folate supplement per day if capable of pregnancy.   Sexuality: Discussed sexually transmitted diseases, partner selection, use of condoms, avoidance of unintended pregnancy  and contraceptive alternatives.   Advised to avoid cigarette smoking.  I discussed with the patient that most people either abstain from alcohol or drink within safe limits (<=14/week and <=4 drinks/occasion for males, <=7/weeks and <= 3 drinks/occasion for females) and that the risk for alcohol disorders and other health effects rises proportionally with the number of drinks per week and how often a drinker exceeds daily limits.  Discussed cessation/primary prevention of drug use and availability of treatment for abuse.   Diet: Encouraged to adjust caloric intake to maintain  or achieve ideal body weight, to reduce intake of dietary saturated fat and total fat, to limit sodium intake by avoiding high sodium foods and not adding table salt, and to maintain adequate dietary potassium and calcium preferably from fresh fruits, vegetables, and low-fat dairy products.    stressed the importance of regular exercise  Injury prevention: Discussed safety belts, safety helmets, smoke detector, smoking near bedding or upholstery.   Dental health: Discussed importance of regular tooth brushing, flossing, and dental visits.    NEXT PREVENTATIVE PHYSICAL DUE IN 1 YEAR. Return in about 6 months (around 09/12/2021).

## 2021-03-14 NOTE — Assessment & Plan Note (Signed)
Recheck levels 

## 2021-03-14 NOTE — Assessment & Plan Note (Addendum)
Doing well on current regimen, no changes made today. Recheck labs.

## 2021-03-15 LAB — THYROID PANEL WITH TSH
Free Thyroxine Index: 1.9 (ref 1.4–3.8)
T3 Uptake: 33 % (ref 22–35)
T4, Total: 5.8 ug/dL (ref 5.1–11.9)
TSH: 0.82 mIU/L

## 2021-03-15 LAB — FERRITIN: Ferritin: 130 ng/mL (ref 16–232)

## 2021-03-15 LAB — HEMOGLOBIN A1C W/OUT EAG: Hgb A1c MFr Bld: 4.9 % of total Hgb (ref <5.7)

## 2021-04-04 ENCOUNTER — Ambulatory Visit (INDEPENDENT_AMBULATORY_CARE_PROVIDER_SITE_OTHER): Payer: 59 | Admitting: Obstetrics & Gynecology

## 2021-04-04 ENCOUNTER — Encounter: Payer: Self-pay | Admitting: Obstetrics & Gynecology

## 2021-04-04 ENCOUNTER — Other Ambulatory Visit: Payer: Self-pay

## 2021-04-04 VITALS — BP 122/82 | Ht 66.0 in | Wt 213.0 lb

## 2021-04-04 DIAGNOSIS — Z1329 Encounter for screening for other suspected endocrine disorder: Secondary | ICD-10-CM

## 2021-04-04 DIAGNOSIS — Z131 Encounter for screening for diabetes mellitus: Secondary | ICD-10-CM

## 2021-04-04 DIAGNOSIS — E669 Obesity, unspecified: Secondary | ICD-10-CM

## 2021-04-04 DIAGNOSIS — Z13220 Encounter for screening for lipoid disorders: Secondary | ICD-10-CM | POA: Diagnosis not present

## 2021-04-04 DIAGNOSIS — Z1321 Encounter for screening for nutritional disorder: Secondary | ICD-10-CM

## 2021-04-04 DIAGNOSIS — Z01419 Encounter for gynecological examination (general) (routine) without abnormal findings: Secondary | ICD-10-CM | POA: Diagnosis not present

## 2021-04-04 NOTE — Patient Instructions (Addendum)
PAP every 5 years Mammogram every year    Call [phone] to schedule at [facility] Colonoscopy every 10 years Labs yearly (with PCP)  Thank you for choosing [facility]. As part of our ongoing efforts to improve patient experience, we would appreciate your feedback. Please fill out the short survey that you will receive by mail or MyChart. Your opinion is important to [place]! - Dr. Pricilla Handler Cohosh, Cimicifuga racemosa oral dosage forms What is this medication? BLACK COHOSH (blak KOH hosh) or Cimicifuga racemosa is a dietary supplement. It is promoted to relieve symptoms of menopause, such as hot flashes. The FDA has not approved this supplement for any medical use. This supplement may be used for other purposes; ask your health care provider or pharmacist if you have questions. This medicine may be used for other purposes; ask your health care provider or pharmacist if you have questions. What should I tell my care team before I take this medication? They need to know if you have any of these conditions: breast cancer cervical, ovarian or uterine cancer high blood pressure infertility liver disease menstrual changes or irregular periods unusual vaginal or uterine bleeding an unusual or allergic reaction to black cohosh, soybeans, tartrazine dye (yellow dye number 5), other medicines, foods, dyes, or preservatives pregnant or trying to get pregnant breast-feeding How should I use this medication? Take this herb by mouth with a glass of water. Follow the directions on the package labeling, or talk to your health care professional. Do not use for longer than 6 months without the advice of a health care professional. Do not use if you are pregnant or breast-feeding. Talk to your obstetrician-gynecologist or certified nurse-midwife. This herb is not for use in children under the age of 18 years. Overdosage: If you think you have taken too much of this medicine contact a poison control  center or emergency room at once. NOTE: This medicine is only for you. Do not share this medicine with others. What if I miss a dose? If you miss a dose, take it as soon as you can. If it is almost time for your next dose, take only that dose. Do not take double or extra doses. What may interact with this medication? atorvastatin cisplatin fertility treatments This list may not describe all possible interactions. Give your health care provider a list of all the medicines, herbs, non-prescription drugs, or dietary supplements you use. Also tell them if you smoke, drink alcohol, or use illegal drugs. Some items may interact with your medicine. What should I watch for while using this medication? Since this herb is derived from a plant, allergic reactions are possible. Stop using this herb if you develop a rash. You may need to see your health care professional, or inform them that this occurred. Report any unusual side effects promptly. If you are taking this herb for menstrual or menopausal symptoms, visit your doctor or health care professional for regular checks on your progress. You should have a complete check-up every 6 months. You will need a regular breast and pelvic exam while on this therapy. Follow the advice of your doctor or health care professional. Women should inform their doctor if they wish to become pregnant or think they might be pregnant. If you have any reason to think you are pregnant, stop taking this herb at once and contact your doctor or health care professional. Herbal or dietary supplements are not regulated like medicines. Rigid quality control standards are not required for  dietary supplements. The purity and strength of these products can vary. The safety and effect of this dietary supplement for a certain disease or illness is not well known. This product is not intended to diagnose, treat, cure or prevent any disease. The Food and Drug Administration suggests the  following to help consumers protect themselves: Always read product labels and follow directions. Natural does not mean a product is safe for humans to take. Look for products that include USP after the ingredient name. This means that the manufacturer followed the standards of the [place] Pharmacopoeia. Supplements made or sold by a nationally known food or drug company are more likely to be made under tight controls. You can write to the company for more information about how the product was made. What side effects may I notice from receiving this medication? Side effects that you should report to your doctor or health care professional as soon as possible: allergic reactions like skin rash, itching or hives, swelling of the face, lips, or tongue breathing problems dizziness palpitations signs and symptoms of liver injury like dark yellow or brown urine; general ill feeling or flu-like symptoms; light-colored stools; loss of appetite; nausea; right upper belly pain; unusually weak or tired; yellowing of the eyes or skin unusual vaginal bleeding Side effects that usually do not require medical attention (report to your doctor or health care professional if they continue or are bothersome): breast tenderness headache nausea upset stomach This list may not describe all possible side effects. Call your doctor for medical advice about side effects. You may report side effects to FDA at 1-800-FDA-1088. Where should I keep my medication? Keep out of the reach of children. Store at room temperature between 15 and 30 degrees C (59 and 86 degrees C). Throw away any unused herb after the expiration date. NOTE: This sheet is a summary. It may not cover all possible information. If you have questions about this medicine, talk to your doctor, pharmacist, or health care provider.  2022 Elsevier/Gold Standard (2015-12-08 14:35:09)

## 2021-04-04 NOTE — Progress Notes (Signed)
HPI:      [name] is a 50 y.o. No obstetric history on file. who LMP was No LMP recorded. Patient has had a hysterectomy., she presents today for her annual examination. The patient has complaints today of feeling more episodes of hot, and mood lability.  Weight gain again.  The patient is sexually active. Her last pap: approximate date 2021 and was normal and last mammogram: approximate date 2021 and was normal. The patient does perform self breast exams.  There is no notable family history of breast or ovarian cancer in her family.  The patient has regular exercise: yes.  The patient denies current symptoms of depression.    GYN History: Contraception: status post hysterectomy  PMHx: Past Medical History:  Diagnosis Date   Abdominal tenderness, right upper quadrant    Abnormal Pap smear of cervix    Allergic rhinitis, cause unspecified    Anemia    Atypical chest pain 06/24/2013   Chronic headaches    Cough    Family history of breast cancer    Family history of pancreatic cancer    9/21 cancer genetic testing letter sent   Lumbago    Other and unspecified hyperlipidemia    Swelling    Thoracic or lumbosacral neuritis or radiculitis, unspecified    Unspecified symptom associated with female genital organs    Unspecified vitamin D deficiency    Past Surgical History:  Procedure Laterality Date   BREAST EXCISIONAL BIOPSY Left    age 50   COLONOSCOPY WITH PROPOFOL N/A 11/02/2017   Procedure: COLONOSCOPY WITH PROPOFOL;  Surgeon: Pasty Spillers, MD;  Location: [facility] END[facility]COPY;  Service: Endoscopy;  Laterality: N/A;   TUBAL LIGATION     VAGINAL HYSTERECTOMY     Family History  Problem Relation Age of Onset   Stroke Mother    Hypertension Mother    Heart murmur Mother    Hypertension Father    Breast cancer Maternal Aunt    Ovarian cancer Maternal Aunt    Lupus Sister    Lupus Daughter    Liver cancer Maternal Grandfather    Cirrhosis Maternal Grandfather     Pancreatic cancer Maternal Grandfather    Hypertension Paternal Grandmother    Sickle cell anemia Sister    Thyroid cancer Sister    Seizures Sister    Hashimoto's thyroiditis Sister    Fibromyalgia Sister    Social History   Tobacco Use   Smoking status: Never   Smokeless tobacco: Never  Vaping Use   Vaping Use: Never used  Substance Use Topics   Alcohol use: No   Drug use: No    Current Outpatient Medications:    acetaminophen (TYLENOL) 325 MG tablet, Take 650 mg by mouth as needed., Disp: , Rfl:    clobetasol (TEMOVATE) 0.05 % external solution, APPLY TO SCALP TWICE A DAY, Disp: 50 mL, Rfl: 1   EPINEPHrine (EPIPEN 2-PAK) 0.3 mg/0.3 mL IJ SOAJ injection, Inject 0.3 mg into the muscle as needed for anaphylaxis., Disp: 2 each, Rfl: 0   fexofenadine (ALLEGRA) 180 MG tablet, Take 180 mg by mouth daily., Disp: , Rfl:    losartan-hydrochlorothiazide (HYZAAR) 50-12.5 MG tablet, Take 1 tablet by mouth daily., Disp: 90 tablet, Rfl: 1   Multiple Vitamin (MULTIVITAMIN WITH MINERALS) TABS tablet, Take 1 tablet by mouth daily., Disp: , Rfl:    neomycin-polymyxin b-dexamethasone (MAXITROL) 3.5-10000-0.1 SUSP, Apply 1 drop into the right eye three times a day., Disp: 5 mL,  Rfl: 0   olopatadine (PATANOL) 0.1 % ophthalmic solution, Place 1 drop into both eyes 2 (two) times daily., Disp: 5 mL, Rfl: 2   triamcinolone (NASACORT) 55 MCG/ACT AERO nasal inhaler, Place 2 sprays into the nose daily., Disp: , Rfl:    Vitamin D, Ergocalciferol, (DRISDOL) 1.25 MG (50000 UNIT) CAPS capsule, Take 1 capsule (50,000 Units total) by mouth every 7 (seven) days., Disp: 4 capsule, Rfl: 2 Allergies: Erythromycin, Ibuprofen, and Vibramycin [doxycycline calcium]  Review of Systems  Constitutional:  Positive for malaise/fatigue. Negative for chills and fever.  HENT:  Negative for congestion, sinus pain and sore throat.   Eyes:  Negative for blurred vision and pain.  Respiratory:  Negative for cough and wheezing.    Cardiovascular:  Negative for chest pain and leg swelling.  Gastrointestinal:  Negative for abdominal pain, constipation, diarrhea, heartburn, nausea and vomiting.  Genitourinary:  Negative for dysuria, frequency, hematuria and urgency.  Musculoskeletal:  Negative for back pain, joint pain, myalgias and neck pain.  Skin:  Negative for itching and rash.  Neurological:  Negative for dizziness, tremors and weakness.  Endo/Heme/Allergies:  Does not bruise/bleed easily.  Psychiatric/Behavioral:  Negative for depression. The patient is not nervous/anxious and does not have insomnia.    Objective: BP 122/82   Ht 5\' 6"  (1.676 m)   Wt 213 lb (96.6 kg)   BMI 34.38 kg/m   Filed Weights   04/04/21 1500  Weight: 213 lb (96.6 kg)   Body mass index is 34.38 kg/m. Physical Exam Constitutional:      General: She is not in acute distress.    Appearance: She is well-developed.  Genitourinary:     Vulva, bladder, rectum and urethral meatus normal.     No lesions in the vagina.     Genitourinary Comments: Vaginal cuff well healed     Right Labia: No rash, tenderness or lesions.    Left Labia: No tenderness, lesions or rash.    No vaginal bleeding.      Right Adnexa: not tender and no mass present.    Left Adnexa: not tender and no mass present.    Cervix is absent.     Uterus is absent.     Pelvic exam was performed with patient in the lithotomy position.  Breasts:    Right: No mass, skin change or tenderness.     Left: No mass, skin change or tenderness.  HENT:     Head: Normocephalic and atraumatic. No laceration.     Right Ear: Hearing normal.     Left Ear: Hearing normal.     Mouth/Throat:     Pharynx: Uvula midline.  Eyes:     Pupils: Pupils are equal, round, and reactive to light.  Neck:     Thyroid: No thyromegaly.  Cardiovascular:     Rate and Rhythm: Normal rate and regular rhythm.     Heart sounds: No murmur heard.   No friction rub. No gallop.  Pulmonary:     Effort:  Pulmonary effort is normal. No respiratory distress.     Breath sounds: Normal breath sounds. No wheezing.  Abdominal:     General: Bowel sounds are normal. There is no distension.     Palpations: Abdomen is soft.     Tenderness: There is no abdominal tenderness. There is no rebound.  Musculoskeletal:        General: Normal range of motion.     Cervical back: Normal range of motion and neck supple.  Neurological:     Mental Status: She is alert and oriented to person, place, and time.     Cranial Nerves: No cranial nerve deficit.  Skin:    General: Skin is warm and dry.  Psychiatric:        Judgment: Judgment normal.  Vitals reviewed.    Assessment:  ANNUAL EXAM 1. Women's annual routine gynecological examination   2. Obesity (BMI 30-39.9)      Screening Plan:            1.  Cervical Screening-  Pap smear schedule reviewed with patient, Pap smear to be scheduled, every 5 yrs  2. Breast screening- Exam annually and mammogram>40 planned   3. Colonoscopy every 10 years, Hemoccult testing - after age 31  4. Labs managed by PCP  5. Obesity (BMI 30-39.9) Diet and exercise discussed   6. Menopause sx's.  [place] Coh[place] discussed.  Alternatives.      F/U  Return in about 1 year (around 04/04/2022) for Annual.  Annamarie Major, MD, [facility], [facility] 04/04/2021  3:34 PM

## 2021-04-04 NOTE — Addendum Note (Signed)
Addended by: Nadara Mustard on: 04/04/2021 03:39 PM   Modules accepted: Orders

## 2021-04-05 ENCOUNTER — Other Ambulatory Visit: Payer: 59

## 2021-04-06 ENCOUNTER — Other Ambulatory Visit: Payer: Self-pay

## 2021-04-06 ENCOUNTER — Other Ambulatory Visit: Payer: 59

## 2021-04-06 DIAGNOSIS — Z131 Encounter for screening for diabetes mellitus: Secondary | ICD-10-CM

## 2021-04-06 DIAGNOSIS — Z1321 Encounter for screening for nutritional disorder: Secondary | ICD-10-CM

## 2021-04-06 DIAGNOSIS — Z1159 Encounter for screening for other viral diseases: Secondary | ICD-10-CM

## 2021-04-06 DIAGNOSIS — E538 Deficiency of other specified B group vitamins: Secondary | ICD-10-CM

## 2021-04-06 DIAGNOSIS — I10 Essential (primary) hypertension: Secondary | ICD-10-CM

## 2021-04-06 DIAGNOSIS — E8881 Metabolic syndrome: Secondary | ICD-10-CM

## 2021-04-06 DIAGNOSIS — Z1329 Encounter for screening for other suspected endocrine disorder: Secondary | ICD-10-CM

## 2021-04-06 DIAGNOSIS — E559 Vitamin D deficiency, unspecified: Secondary | ICD-10-CM

## 2021-04-06 DIAGNOSIS — Z13220 Encounter for screening for lipoid disorders: Secondary | ICD-10-CM

## 2021-04-07 LAB — VITAMIN D 25 HYDROXY (VIT D DEFICIENCY, FRACTURES): Vit D, 25-Hydroxy: 46 ng/mL (ref 30.0–100.0)

## 2021-04-07 LAB — COMPREHENSIVE METABOLIC PANEL
ALT: 16 IU/L (ref 0–32)
AST: 23 IU/L (ref 0–40)
Albumin/Globulin Ratio: 1.7 (ref 1.2–2.2)
Albumin: 4.1 g/dL (ref 3.8–4.8)
Alkaline Phosphatase: 49 IU/L (ref 44–121)
BUN/Creatinine Ratio: 11 (ref 9–23)
BUN: 10 mg/dL (ref 6–24)
Bilirubin Total: 0.7 mg/dL (ref 0.0–1.2)
CO2: 25 mmol/L (ref 20–29)
Calcium: 9.2 mg/dL (ref 8.7–10.2)
Chloride: 102 mmol/L (ref 96–106)
Creatinine, Ser: 0.95 mg/dL (ref 0.57–1.00)
Globulin, Total: 2.4 g/dL (ref 1.5–4.5)
Glucose: 92 mg/dL (ref 70–99)
Potassium: 3.8 mmol/L (ref 3.5–5.2)
Sodium: 140 mmol/L (ref 134–144)
Total Protein: 6.5 g/dL (ref 6.0–8.5)
eGFR: 73 mL/min/{1.73_m2} (ref >59)

## 2021-04-07 LAB — CBC WITH DIFFERENTIAL/PLATELET
Basophils Absolute: 0 10*3/uL (ref 0.0–0.2)
Basos: 1 %
EOS (ABSOLUTE): 0.1 10*3/uL (ref 0.0–0.4)
Eos: 2 %
Hematocrit: 36.6 % (ref 34.0–46.6)
Hemoglobin: 11.9 g/dL (ref 11.1–15.9)
Immature Grans (Abs): 0 10*3/uL (ref 0.0–0.1)
Immature Granulocytes: 0 %
Lymphocytes Absolute: 1.3 10*3/uL (ref 0.7–3.1)
Lymphs: 34 %
MCH: 23.6 pg — ABNORMAL LOW (ref 26.6–33.0)
MCHC: 32.5 g/dL (ref 31.5–35.7)
MCV: 73 fL — ABNORMAL LOW (ref 79–97)
Monocytes Absolute: 0.6 10*3/uL (ref 0.1–0.9)
Monocytes: 15 %
Neutrophils Absolute: 1.8 10*3/uL (ref 1.4–7.0)
Neutrophils: 48 %
Platelets: 199 10*3/uL (ref 150–450)
RBC: 5.05 x10E6/uL (ref 3.77–5.28)
RDW: 14.7 % (ref 11.7–15.4)
WBC: 3.8 10*3/uL (ref 3.4–10.8)

## 2021-04-07 LAB — LIPID PANEL
Chol/HDL Ratio: 3.8 ratio (ref 0.0–4.4)
Cholesterol, Total: 206 mg/dL — ABNORMAL HIGH (ref 100–199)
HDL: 54 mg/dL
LDL Chol Calc (NIH): 138 mg/dL — ABNORMAL HIGH (ref 0–99)
Triglycerides: 77 mg/dL (ref 0–149)
VLDL Cholesterol Cal: 14 mg/dL (ref 5–40)

## 2021-04-07 LAB — GLUCOSE, FASTING: Glucose, Plasma: 93 mg/dL (ref 70–99)

## 2021-04-07 LAB — TSH: TSH: 1.15 u[IU]/mL (ref 0.450–4.500)

## 2021-04-19 ENCOUNTER — Telehealth: Payer: Self-pay

## 2021-04-19 NOTE — Telephone Encounter (Signed)
Copied from CRM [id number]. Topic: Appointment Scheduling - Scheduling Inquiry for Clinic >> Apr 19, 2021  3:51 PM Randol Kern wrote: Reason for CRM: Pt needs a diagnostic mammogram order to be seen please advise   Best contact: [phone]

## 2021-04-19 NOTE — Telephone Encounter (Signed)
Called back to pt, informed her there has already been a mammogram order placed by [facility] in July. Pt stated she called wrong office it was supposed to be from [facility].

## 2021-04-20 ENCOUNTER — Ambulatory Visit: Payer: No Typology Code available for payment source | Admitting: Obstetrics & Gynecology

## 2021-04-20 ENCOUNTER — Telehealth: Payer: Self-pay | Admitting: Obstetrics & Gynecology

## 2021-04-20 ENCOUNTER — Other Ambulatory Visit: Payer: Self-pay | Admitting: Obstetrics & Gynecology

## 2021-04-20 DIAGNOSIS — N644 Mastodynia: Secondary | ICD-10-CM

## 2021-04-20 NOTE — Telephone Encounter (Signed)
Pt aware.

## 2021-04-20 NOTE — Telephone Encounter (Signed)
This pt tried to schedule her mammogram at [facility].  She mentioned to them that she was having some tenderness in one of her breasts.  They told her that she would need a diagnostic mammogram in this case.  Will you place an order for the diagnostic mammogram?

## 2021-04-20 NOTE — Telephone Encounter (Signed)
Done, let her know

## 2021-04-25 ENCOUNTER — Other Ambulatory Visit (HOSPITAL_COMMUNITY): Payer: Self-pay

## 2021-04-25 ENCOUNTER — Encounter: Payer: Self-pay | Admitting: Family Medicine

## 2021-04-25 ENCOUNTER — Ambulatory Visit: Payer: 59 | Admitting: Family Medicine

## 2021-04-25 ENCOUNTER — Other Ambulatory Visit: Payer: Self-pay

## 2021-04-25 ENCOUNTER — Telehealth: Payer: Self-pay

## 2021-04-25 VITALS — BP 134/72 | HR 86 | Temp 98.0°F | Resp 16 | Ht 66.0 in | Wt 214.0 lb

## 2021-04-25 DIAGNOSIS — F410 Panic disorder [episodic paroxysmal anxiety] without agoraphobia: Secondary | ICD-10-CM

## 2021-04-25 DIAGNOSIS — F341 Dysthymic disorder: Secondary | ICD-10-CM | POA: Diagnosis not present

## 2021-04-25 MED ORDER — HYDROXYZINE HCL 10 MG PO TABS
10.0000 mg | ORAL_TABLET | Freq: Every evening | ORAL | 0 refills | Status: DC
Start: 2021-04-25 — End: 2021-05-16
  Filled 2021-04-25 (×2): qty 30, 15d supply, fill #0

## 2021-04-25 MED ORDER — ESCITALOPRAM OXALATE 10 MG PO TABS
10.0000 mg | ORAL_TABLET | Freq: Every morning | ORAL | 0 refills | Status: DC
  Filled 2021-04-25 (×2): qty 30, 30d supply, fill #0

## 2021-04-25 NOTE — Progress Notes (Signed)
Name: Debra Stephens   MRN: [id number]    DOB: 02-16-1971   Date:04/25/2021       Progress Note  Subjective  Chief Complaint  Anxiety  HPI  Dysthymia/Anxiety: she states she has been overwhelmed . Her mother has been very sick, still works by herself and she has to go back and forth to check on her mother. Her daughter is pregnant and expecting twins and recently admitted for pre term bleeding. She has been having to also take care of her grandchildren  ( 45 yo and 43 yo) she also worries about her husband that has uncontrolled DM, kidney function is dropping.  She states difficulty sleeping, feels nervous all the time, at times her heart races and unable to breath, sensation of doom at times. 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   She states able to work but the classes are at the end of the day and unable to focus.   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   Vitamin D deficiency 11/22/2016   ANA positive 05/24/2016   H/O urticaria 09/08/2015   Angio-edema 09/08/2015    Past Surgical History:  Procedure Laterality Date   BREAST EXCISIONAL BIOPSY Left    age 50   COLONOSCOPY WITH PROPOFOL N/A 11/02/2017   Procedure: COLONOSCOPY WITH PROPOFOL;  Surgeon: Virgel Manifold, MD;  Location: [facility] ENDOSCOPY;  Service: Endoscopy;  Laterality: N/A;   TUBAL LIGATION     VAGINAL HYSTERECTOMY      Family History  Problem Relation Age of Onset   Stroke Mother    Hypertension Mother    Heart murmur Mother    Hypertension Father    Breast cancer Maternal Aunt    Ovarian cancer Maternal Aunt    Lupus Sister     Lupus Daughter    Liver cancer Maternal Grandfather    Cirrhosis Maternal Grandfather    Pancreatic cancer Maternal Grandfather    Hypertension Paternal Grandmother    Sickle cell anemia Sister    Thyroid cancer Sister    Seizures Sister    Hashimoto's thyroiditis Sister    Fibromyalgia Sister     Social History   Tobacco Use   Smoking status: Never   Smokeless tobacco: Never  Substance Use Topics   Alcohol use: No     Current Outpatient Medications:    acetaminophen (TYLENOL) 325 MG tablet, Take 650 mg by mouth as needed., Disp: , Rfl:    clobetasol (TEMOVATE) 0.05 % external solution, APPLY TO SCALP TWICE A DAY, Disp: 50 mL, Rfl: 1   EPINEPHrine (EPIPEN 2-PAK) 0.3 mg/0.3 mL IJ SOAJ injection, Inject 0.3 mg into the muscle as needed for anaphylaxis., Disp: 2 each, Rfl: 0   fexofenadine (ALLEGRA) 180 MG tablet, Take 180 mg by mouth daily., Disp: , Rfl:    losartan-hydrochlorothiazide (HYZAAR) 50-12.5 MG tablet, Take 1 tablet by mouth daily., Disp: 90 tablet, Rfl: 1   Multiple Vitamin (MULTIVITAMIN WITH MINERALS) TABS tablet, Take 1 tablet by mouth daily., Disp: , Rfl:    neomycin-polymyxin b-dexamethasone (MAXITROL) 3.5-10000-0.1 SUSP, Apply 1 drop into the right eye three times a day., Disp: 5 mL, Rfl: 0  olopatadine (PATANOL) 0.1 % ophthalmic solution, Place 1 drop into both eyes 2 (two) times daily., Disp: 5 mL, Rfl: 2   triamcinolone (NASACORT) 55 MCG/ACT AERO nasal inhaler, Place 2 sprays into the nose daily., Disp: , Rfl:    Vitamin D, Ergocalciferol, (DRISDOL) 1.25 MG (50000 UNIT) CAPS capsule, Take 1 capsule (50,000 Units total) by mouth every 7 (seven) days., Disp: 4 capsule, Rfl: 2  Allergies  Allergen Reactions   Erythromycin Other (See Comments)   Ibuprofen Hives   Vibramycin [Doxycycline Calcium] Hives    I personally reviewed active problem list, medication list, allergies, family history, social history, health maintenance with the patient/caregiver  today.   ROS  Constitutional: Negative for fever or weight change.  Respiratory: Negative for cough and shortness of breath.   Cardiovascular: Negative for chest pain or palpitations.  Gastrointestinal: Negative for abdominal pain, no bowel changes.  Musculoskeletal: Negative for gait problem or joint swelling.  Skin: Negative for rash.  Neurological: Negative for dizziness or headache.  No other specific complaints in a complete review of systems (except as listed in HPI above).   Objective  Vitals:   04/25/21 1005  BP: 134/72  Pulse: 86  Resp: 16  Temp: 98 F (36.7 C)  SpO2: 99%  Weight: 214 lb (97.1 kg)  Height: _0  (1.676 m)    Body mass index is 34.54 kg/m.  Physical Exam  Constitutional: Patient appears well-developed and well-nourished.  No distress.  HEENT: head atraumatic, normocephalic, pupils equal and reactive to light, neck supple, throat within normal limits Cardiovascular: Normal rate, regular rhythm and normal heart sounds.  No murmur heard. No BLE edema. Pulmonary/Chest: Effort normal and breath sounds normal. No respiratory distress. Abdominal: Soft.  There is no tenderness. Psychiatric: Patient has a normal mood and affect. behavior is normal. Judgment and thought content normal.   Recent Results (from the past 2160 hour(s))  Ferritin     Status: None   Collection Time: 03/14/21 11:39 AM  Result Value Ref Range   Ferritin 130 16 - 232 ng/mL  Thyroid Panel With TSH     Status: None   Collection Time: 03/14/21 11:39 AM  Result Value Ref Range   T3 Uptake 33 22 - 35 %   T4, Total 5.8 5.1 - 11.9 mcg/dL   Free Thyroxine Index 1.9 1.4 - 3.8   TSH 0.82 mIU/L    Comment:           Reference Range .           > or = 20 Years  0.40-4.50 .                Pregnancy Ranges           First trimester    0.26-2.66           Second trimester   0.55-2.73           Third trimester    0.43-2.91   Hemoglobin A1C w/out eAG     Status: None   Collection  Time: 03/14/21 11:39 AM  Result Value Ref Range   Hgb A1c MFr Bld 4.9 <5.7 % of total Hgb    Comment: For the purpose of screening for the presence of diabetes: . <5.7%       Consistent with the absence of diabetes 5.7-6.4%    Consistent with increased risk for diabetes             (prediabetes) > or =6.5%  Consistent with diabetes . This assay result is consistent with a decreased risk of diabetes. . Currently, no consensus exists regarding use of hemoglobin A1c for diagnosis of diabetes in children. . According to American Diabetes Association (ADA) guidelines, hemoglobin A1c <7.0% represents optimal control in non-pregnant diabetic patients. Different metrics may apply to specific patient populations.  Standards of Medical Care in Diabetes(ADA). Marland Kitchen   VITAMIN D 25 Hydroxy (Vit-D Deficiency, Fractures)     Status: None   Collection Time: 04/06/21  9:14 AM  Result Value Ref Range   Vit D, 25-Hydroxy 46.0 30.0 - 100.0 ng/mL    Comment: Vitamin D deficiency has been defined by the [place] practice guideline as a level of serum 25-OH vitamin D less than 20 ng/mL (1,2). The Endocrine Society went on to further define vitamin D insufficiency as a level between 21 and 29 ng/mL (2). 1. IOM (Institute of Medicine). 2010. Dietary reference    intakes for calcium and D. [place]: The    [employer]. 2. Holick MF, Binkley [place], Bischoff-Ferrari HA, et al.    Evaluation, treatment, and prevention of vitamin D    deficiency: an Endocrine Society clinical practice    guideline. JCEM. 2011 Jul; 96(7):1911-30.   TSH     Status: None   Collection Time: 04/06/21  9:14 AM  Result Value Ref Range   TSH 1.150 0.450 - 4.500 uIU/mL  Glucose, fasting     Status: None   Collection Time: 04/06/21  9:14 AM  Result Value Ref Range   Glucose, Plasma 93 70 - 99 mg/dL    Comment:                         Please Note:                           Prediabetes        100 - 125                          Diabetes                >125   Lipid panel     Status: Abnormal   Collection Time: 04/06/21  9:14 AM  Result Value Ref Range   Cholesterol, Total 206 (H) 100 - 199 mg/dL   Triglycerides 77 0 - 149 mg/dL   HDL 54 >39 mg/dL   VLDL Cholesterol Cal 14 5 - 40 mg/dL   LDL Chol Calc (NIH) 138 (H) 0 - 99 mg/dL   Chol/HDL Ratio 3.8 0.0 - 4.4 ratio    Comment:                                   T. Chol/HDL Ratio                                             Men  Women                               1/2 Avg.Risk  3.4    3.3  Avg.Risk  5.0    4.4                                2X Avg.Risk  9.6    7.1                                3X Avg.Risk 23.4   11.0   Comprehensive Metabolic Panel (CMET)     Status: None   Collection Time: 04/06/21 10:49 AM  Result Value Ref Range   Glucose 92 70 - 99 mg/dL   BUN 10 6 - 24 mg/dL   Creatinine, Ser 0.95 0.57 - 1.00 mg/dL   eGFR 73 >59 mL/min/1.73   BUN/Creatinine Ratio 11 9 - 23   Sodium 140 134 - 144 mmol/L   Potassium 3.8 3.5 - 5.2 mmol/L   Chloride 102 96 - 106 mmol/L   CO2 25 20 - 29 mmol/L   Calcium 9.2 8.7 - 10.2 mg/dL   Total Protein 6.5 6.0 - 8.5 g/dL   Albumin 4.1 3.8 - 4.8 g/dL   Globulin, Total 2.4 1.5 - 4.5 g/dL   Albumin/Globulin Ratio 1.7 1.2 - 2.2   Bilirubin Total 0.7 0.0 - 1.2 mg/dL   Alkaline Phosphatase 49 44 - 121 IU/L   AST 23 0 - 40 IU/L   ALT 16 0 - 32 IU/L  CBC w/Diff/Platelet     Status: Abnormal   Collection Time: 04/06/21 10:49 AM  Result Value Ref Range   WBC 3.8 3.4 - 10.8 x10E3/uL   RBC 5.05 3.77 - 5.28 x10E6/uL   Hemoglobin 11.9 11.1 - 15.9 g/dL   Hematocrit 36.6 34.0 - 46.6 %   MCV 73 (L) 79 - 97 fL   MCH 23.6 (L) 26.6 - 33.0 pg   MCHC 32.5 31.5 - 35.7 g/dL   RDW 14.7 11.7 - 15.4 %   Platelets 199 150 - 450 x10E3/uL   Neutrophils 48 Not Estab. %   Lymphs 34 Not Estab. %   Monocytes 15 Not Estab. %   Eos 2 Not Estab. %    Basos 1 Not Estab. %   Neutrophils Absolute 1.8 1.4 - 7.0 x10E3/uL   Lymphocytes Absolute 1.3 0.7 - 3.1 x10E3/uL   Monocytes Absolute 0.6 0.1 - 0.9 x10E3/uL   EOS (ABSOLUTE) 0.1 0.0 - 0.4 x10E3/uL   Basophils Absolute 0.0 0.0 - 0.2 x10E3/uL   Immature Granulocytes 0 Not Estab. %   Immature Grans (Abs) 0.0 0.0 - 0.1 x10E3/uL     PHQ2/9: Depression screen [facility] 2/9 04/25/2021 03/14/2021 02/09/2021 01/10/2021 11/29/2020  Decreased Interest 1 0 0 0 0  Down, Depressed, Hopeless 2 0 0 0 0  PHQ - 2 Score 3 0 0 0 0  Altered sleeping 3 - - - -  Tired, decreased energy 3 - - - -  Change in appetite 3 - - - -  Feeling bad or failure about yourself  0 - - - -  Trouble concentrating 3 - - - -  Moving slowly or fidgety/restless 0 - - - -  Suicidal thoughts 0 - - - -  PHQ-9 Score 15 - - - -  Difficult doing work/chores - - - - -    phq 9 is positive  GAD 7 : Generalized Anxiety Score 04/25/2021  Nervous, Anxious, on Edge 3  Control/stop worrying 0  Worry too much -  different things 2  Trouble relaxing 3  Restless 3  Easily annoyed or irritable 2  Afraid - awful might happen 1  Total GAD 7 Score 14      Fall Risk: Fall Risk  04/25/2021 03/14/2021 02/09/2021 01/10/2021 11/29/2020  Falls in the past year? 0 0 0 0 0  Number falls in past yr: 0 0 0 0 0  Injury with Fall? 0 0 0 0 0  Risk for fall due to : No Fall Risks - - - -  Follow up Falls prevention discussed Falls evaluation completed - - Falls evaluation completed     Functional Status Survey: Is the patient deaf or have difficulty hearing?: No Does the patient have difficulty seeing, even when wearing glasses/contacts?: No Does the patient have difficulty concentrating, remembering, or making decisions?: Yes Does the patient have difficulty walking or climbing stairs?: Yes Does the patient have difficulty dressing or bathing?: No Does the patient have difficulty doing errands alone such as visiting a doctor's office or shopping?:  No    Assessment & Plan  1. Dysthymia  - escitalopram (LEXAPRO) 10 MG tablet; Take 1 tablet (10 mg total) by mouth in the morning.  Dispense: 30 tablet; Refill: 0  Discussed boundaries , getting therapy and discussed FMLA but she will try discussing it with her boss first since she is on her probational period   2. Panic attack  - hydrOXYzine (ATARAX/VISTARIL) 10 MG tablet; Take 1-2 tablets (10-20 mg total) by mouth every evening.  Dispense: 30 tablet; Refill: 0 .

## 2021-04-25 NOTE — Telephone Encounter (Signed)
I"m not able to see anything.

## 2021-04-28 ENCOUNTER — Other Ambulatory Visit: Payer: Self-pay | Admitting: Obstetrics & Gynecology

## 2021-04-28 DIAGNOSIS — N644 Mastodynia: Secondary | ICD-10-CM

## 2021-05-12 ENCOUNTER — Other Ambulatory Visit: Payer: Self-pay

## 2021-05-13 NOTE — Progress Notes (Signed)
Name: Debra Stephens   MRN: [id number]    DOB: [birth year]   Date:05/16/2021       Progress Note  Subjective  Chief Complaint  Follow Up  I connected with  Debra Stephens  on 05/16/21 at 11:40 AM EST by a video enabled telemedicine application and verified that I am speaking with the correct person using two identifiers.  I discussed the limitations of evaluation and management by telemedicine and the availability of in person appointments. The patient expressed understanding and agreed to proceed with the virtual visit  Staff also discussed with the patient that there may be a patient responsible charge related to this service. Patient Location: at home  Provider Location: [facility] Additional Individuals present: alone   HPI  Dysthymia/Anxiety: she saw me about 6 weeks ago and was feeling overwhelmed . Her mother has been very sick,and she was going back and forth to take care of her., but now she has a CNA and it has been very helpful. Her daughter is pregnant and expecting twins but is now 23 weeks and no longer in the hospital. She has not been responsible for her grandchildren - she told her daughter her boundaries and also taking medication and it has really helped.   She has been sleeping better, hydroxizine was too strong - causing drowsiness. Not sleeping well now due to URI    URI: symptoms started 4 days ago, she states the night before she was out in the cold. The following day she had a slight sore throat, followed by rhinorrhea, nasal congestion. She had feels, fatigue and is feeling very tired. She also has a dry cough and last night also noticed SOB. Home COVID-19 test was negative twice once on Friday and second one on Saturday.  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   Vitamin D deficiency 11/22/2016   ANA positive 05/24/2016   H/O urticaria 09/08/2015   Angio-edema 09/08/2015    Past Surgical History:  Procedure Laterality Date   BREAST EXCISIONAL BIOPSY Left    age 50   COLONOSCOPY WITH PROPOFOL N/A 11/02/2017   Procedure: COLONOSCOPY WITH PROPOFOL;  Surgeon: Pasty Spillers, MD;  Location: [facility] ENDOSCOPY;  Service: Endoscopy;  Laterality: N/A;   TUBAL LIGATION     VAGINAL HYSTERECTOMY      Family History  Problem Relation Age of Onset   Stroke Mother    Hypertension Mother    Heart murmur Mother    Hypertension Father    Breast cancer Maternal Aunt    Ovarian cancer Maternal Aunt    Lupus Sister    Lupus Daughter    Liver cancer Maternal Grandfather    Cirrhosis Maternal Grandfather    Pancreatic cancer Maternal Grandfather    Hypertension Paternal Grandmother    Sickle cell anemia Sister    Thyroid cancer Sister    Seizures Sister    Hashimoto's thyroiditis Sister    Fibromyalgia Sister     Social History   Socioeconomic History   Marital  status: Married    Spouse name: Not on file   Number of children: Not on file   Years of education: Not on file   Highest education level: Not on file  Occupational History   Not on file  Tobacco Use   Smoking status: Never   Smokeless tobacco: Never  Vaping Use   Vaping Use: Never used  Substance and Sexual Activity   Alcohol use: No   Drug use: No   Sexual activity: Yes  Other Topics Concern   Not on file  Social History Narrative   Not on file   Social Determinants of Health   Financial Resource Strain: Low Risk    Difficulty of Paying Living Expenses: Not hard at all  Food Insecurity: No Food Insecurity    Worried About Programme researcher, broadcasting/film/video in the Last Year: Never true   Ran Out of Food in the Last Year: Never true  Transportation Needs: No Transportation Needs   Lack of Transportation (Medical): No   Lack of Transportation (Non-Medical): No  Physical Activity: Insufficiently Active   Days of Exercise per Week: 1 day   Minutes of Exercise per Session: 20 min  Stress: No Stress Concern Present   Feeling of Stress : Only a little  Social Connections: Press photographer of Communication with Friends and Family: More than three times a week   Frequency of Social Gatherings with Friends and Family: Once a week   Attends Religious Services: 1 to 4 times per year   Active Member of [employer] or Organizations: Yes   Attends Engineer, structural: More than 4 times per year   Marital Status: Married  Catering manager Violence: Not At Risk   Fear of Current or Ex-Partner: No   Emotionally Abused: No   Physically Abused: No   Sexually Abused: No     Current Outpatient Medications:    acetaminophen (TYLENOL) 325 MG tablet, Take 650 mg by mouth as needed., Disp: , Rfl:    clobetasol (TEMOVATE) 0.05 % external solution, APPLY TO SCALP TWICE A DAY, Disp: 50 mL, Rfl: 1   EPINEPHrine (EPIPEN 2-PAK) 0.3 mg/0.3 mL IJ SOAJ injection, Inject 0.3 mg into the muscle as needed for anaphylaxis., Disp: 2 each, Rfl: 0   escitalopram (LEXAPRO) 10 MG tablet, Take 1 tablet (10 mg total) by mouth in the morning., Disp: 30 tablet, Rfl: 0   fexofenadine (ALLEGRA) 180 MG tablet, Take 180 mg by mouth daily., Disp: , Rfl:    losartan-hydrochlorothiazide (HYZAAR) 50-12.5 MG tablet, Take 1 tablet by mouth daily., Disp: 90 tablet, Rfl: 1   Multiple Vitamin (MULTIVITAMIN WITH MINERALS) TABS tablet, Take 1 tablet by mouth daily., Disp: , Rfl:    olopatadine (PATANOL) 0.1 % ophthalmic solution, Place 1 drop into both eyes 2 (two) times daily., Disp: 5 mL, Rfl: 2   triamcinolone (NASACORT) 55 MCG/ACT AERO nasal  inhaler, Place 2 sprays into the nose daily., Disp: , Rfl:    Vitamin D, Ergocalciferol, (DRISDOL) 1.25 MG (50000 UNIT) CAPS capsule, Take 1 capsule (50,000 Units total) by mouth every 7 (seven) days., Disp: 4 capsule, Rfl: 2  Allergies  Allergen Reactions   Erythromycin Other (See Comments)   Ibuprofen Hives   Vibramycin [Doxycycline Calcium] Hives    I personally reviewed active problem list, medication list, allergies, family history, social history, health maintenance with the patient/caregiver today.   ROS  Ten systems reviewed and is negative except as mentioned in HPI  Objective  Virtual encounter, vitals not obtained.  There is no height or weight on file to calculate BMI.  Physical Exam  Awake, alert and oriented  PHQ2/9: Depression screen [facility] 2/9 05/16/2021 04/25/2021 03/14/2021 02/09/2021 01/10/2021  Decreased Interest 0 1 0 0 0  Down, Depressed, Hopeless 0 2 0 0 0  PHQ - 2 Score 0 3 0 0 0  Altered sleeping 1 3 - - -  Tired, decreased energy 1 3 - - -  Change in appetite 0 3 - - -  Feeling bad or failure about yourself  0 0 - - -  Trouble concentrating 1 3 - - -  Moving slowly or fidgety/restless 0 0 - - -  Suicidal thoughts 0 0 - - -  PHQ-9 Score 3 15 - - -  Difficult doing work/chores - - - - -   PHQ-2/9 Result is positive    Fall Risk: Fall Risk  05/16/2021 04/25/2021 03/14/2021 02/09/2021 01/10/2021  Falls in the past year? 0 0 0 0 0  Number falls in past yr: 0 0 0 0 0  Injury with Fall? 0 0 0 0 0  Risk for fall due to : No Fall Risks No Fall Risks - - -  Follow up Falls prevention discussed Falls prevention discussed Falls evaluation completed - -     Assessment & Plan  1. Viral upper respiratory illness   2. Panic attack  - escitalopram (LEXAPRO) 10 MG tablet; Take 1 tablet (10 mg total) by mouth in the morning.  Dispense: 90 tablet; Refill: 0  3. Dysthymia  - escitalopram (LEXAPRO) 10 MG tablet; Take 1 tablet (10 mg total) by mouth in the  morning.  Dispense: 90 tablet; Refill: 0   I discussed the assessment and treatment plan with the patient. The patient was provided an opportunity to ask questions and all were answered. The patient agreed with the plan and demonstrated an understanding of the instructions.  The patient was advised to call back or seek an in-person evaluation if the symptoms worsen or if the condition fails to improve as anticipated.  I provided 20 minutes of non-face-to-face time during this encounter.

## 2021-05-16 ENCOUNTER — Encounter: Payer: Self-pay | Admitting: Family Medicine

## 2021-05-16 ENCOUNTER — Telehealth (INDEPENDENT_AMBULATORY_CARE_PROVIDER_SITE_OTHER): Payer: 59 | Admitting: Family Medicine

## 2021-05-16 DIAGNOSIS — J069 Acute upper respiratory infection, unspecified: Secondary | ICD-10-CM | POA: Diagnosis not present

## 2021-05-16 DIAGNOSIS — F410 Panic disorder [episodic paroxysmal anxiety] without agoraphobia: Secondary | ICD-10-CM

## 2021-05-16 DIAGNOSIS — F341 Dysthymic disorder: Secondary | ICD-10-CM | POA: Diagnosis not present

## 2021-05-16 MED ORDER — ESCITALOPRAM OXALATE 10 MG PO TABS: 10.0000 mg | ORAL_TABLET | Freq: Every morning | ORAL | 0 refills | Status: DC

## 2021-05-17 ENCOUNTER — Other Ambulatory Visit: Payer: Self-pay | Admitting: Family Medicine

## 2021-05-17 ENCOUNTER — Ambulatory Visit: Payer: Self-pay

## 2021-05-17 MED ORDER — BENZONATATE 100 MG PO CAPS: 100.0000 mg | ORAL_CAPSULE | Freq: Two times a day (BID) | ORAL | 0 refills | Status: DC | PRN

## 2021-05-17 NOTE — Telephone Encounter (Signed)
Pt called, states she is wanting refill on Tessalon perles and cough syrup with codeine because hers expired in 11/2020. Pt is still having dry cough and unable to sleep. She states she just a a VV yesterday and thought her medications were still in date to use. Pt is also asking if MD note can be sent in for today d/t missed work. Advised pt, I will send over message to provider and if medications can be sent in we will reach out. No other questions/concerns noted.   Reason for Disposition  [1] Pharmacy calling with prescription questions AND [2] triager unable to answer question  Answer Assessment - Initial Assessment Questions 1. DRUG NAME: "What medicine do you need to have refilled?"     Tessalon Perles and cough syrup with codeine 2. REFILLS REMAINING: "How many refills are remaining?" (Note: The label on the medicine or pill bottle will show how many refills are remaining. If there are no refills remaining, then a renewal may be needed.)     0 3. EXPIRATION DATE: "What is the expiration date?" (Note: The label states when the prescription will expire, and thus can no longer be refilled.)     11/2020 4. PRESCRIBING HCP: "Who prescribed it?" Reason: If prescribed by specialist, call should be referred to that group.     [facility]  5. SYMPTOMS: "Do you have any symptoms?"     Cough that is constant and affecting sleep 6. PREGNANCY: "Is there any chance that you are pregnant?" "When was your last menstrual period?"     No  Protocols used: Medication Refill and Renewal Call-A-AH

## 2021-05-17 NOTE — Telephone Encounter (Signed)
Dr.Sowles since you had the video visit with her could you help? Pt is requesting Tessalon but I see it was not prescribed from a provider here in the office. Please Advice.

## 2021-05-17 NOTE — Telephone Encounter (Signed)
Patient called, left VM to return the call to the office to let us know the name of the medication she is wanting.  Summary: needs meds called in appt yesterday   Pt had appt with Dr Carlynn Purl, Dr offered med and pt refused, Pt stated had med but states found it all expired. She wants to request for meds she has had before [phone]

## 2021-05-18 ENCOUNTER — Other Ambulatory Visit: Payer: Self-pay | Admitting: Family Medicine

## 2021-05-18 ENCOUNTER — Other Ambulatory Visit: Payer: Self-pay

## 2021-05-18 MED ORDER — BENZONATATE 100 MG PO CAPS
100.0000 mg | ORAL_CAPSULE | Freq: Two times a day (BID) | ORAL | 0 refills | Status: DC | PRN
  Filled 2021-05-18: qty 40, 10d supply, fill #0

## 2021-05-18 NOTE — Telephone Encounter (Signed)
Called pt to let know Tessalon was sent, pt asked to be sent to [facility] pharmacy and will resend it. Also sent work note through [employer].

## 2021-05-18 NOTE — Telephone Encounter (Signed)
Dr.Sowles, could you send it to [facility] pharmacy? Pt is requesting to be sent there. I tried but it does not show the e-receipt confirmation.

## 2021-05-18 NOTE — Addendum Note (Signed)
Addended by: Forde Radon on: 05/18/2021 08:32 AM   Modules accepted: Orders

## 2021-05-25 ENCOUNTER — Other Ambulatory Visit: Payer: Self-pay

## 2021-05-25 ENCOUNTER — Inpatient Hospital Stay: Admission: RE | Admit: 2021-05-25 | Payer: Self-pay | Source: Ambulatory Visit

## 2021-06-02 ENCOUNTER — Other Ambulatory Visit: Payer: Self-pay

## 2021-06-02 ENCOUNTER — Ambulatory Visit
Admission: RE | Admit: 2021-06-02 | Discharge: 2021-06-02 | Disposition: A | Discharge status: Home / Self Care | Payer: 59 | Source: Ambulatory Visit | Attending: Obstetrics & Gynecology | Admitting: Obstetrics & Gynecology

## 2021-06-02 ENCOUNTER — Encounter: Payer: Self-pay | Admitting: Nurse Practitioner

## 2021-06-02 ENCOUNTER — Ambulatory Visit: Payer: 59 | Admitting: Nurse Practitioner

## 2021-06-02 VITALS — BP 124/82 | HR 98 | Temp 98.5°F | Resp 16 | Ht 66.0 in | Wt 216.9 lb

## 2021-06-02 DIAGNOSIS — I10 Essential (primary) hypertension: Secondary | ICD-10-CM

## 2021-06-02 DIAGNOSIS — E785 Hyperlipidemia, unspecified: Secondary | ICD-10-CM | POA: Diagnosis not present

## 2021-06-02 DIAGNOSIS — N644 Mastodynia: Secondary | ICD-10-CM

## 2021-06-02 DIAGNOSIS — F419 Anxiety disorder, unspecified: Secondary | ICD-10-CM | POA: Diagnosis not present

## 2021-06-02 DIAGNOSIS — Z6835 Body mass index (BMI) 35.0-35.9, adult: Secondary | ICD-10-CM

## 2021-06-02 MED ORDER — LOSARTAN POTASSIUM-HCTZ 50-12.5 MG PO TABS
1.0000 | ORAL_TABLET | Freq: Every day | ORAL | 3 refills | Status: DC
  Filled 2021-06-02: qty 30, 30d supply, fill #0
  Filled 2021-07-28: qty 30, 30d supply, fill #1
  Filled 2021-08-31: qty 90, 90d supply, fill #2
  Filled 2022-01-02: qty 90, 90d supply, fill #3

## 2021-06-02 MED ORDER — PHENTERMINE HCL 37.5 MG PO CAPS: ORAL_CAPSULE | ORAL | 1 refills | Status: DC

## 2021-06-02 NOTE — Progress Notes (Addendum)
BP 124/82    Pulse 98    Temp 98.5 F (36.9 C) (Oral)    Resp 16    Ht 5' 6"  (1.676 m)    Wt 216 lb 14.4 oz (98.4 kg)    SpO2 98%    BMI 35.01 kg/m    Subjective:    Patient ID: Debra Stephens, female    DOB: [birth year], 50 y.o.   MRN: [id number]  HPI: Debra Stephens is a 50 y.o. female, here alone  Chief Complaint  Patient presents with   Hypertension   Anxiety    6 month follow up   HTN: Blood pressure today is 124/82.  She is currently taking losartan-hydrochlorothiazide 50-12.5 mg.  She says her blood pressures have been good at home.  She denies any chest pain, shortness of breath, headaches or blurred vision.   Hyperlipidemia: Her last LDL was 138 on 04/06/21. Discussed ways to reduce cholesterol.  Dr. Kenton Kingfisher recommended that she start omega-3 or red yeast rice.  She has not taken any of those. She is going to work on diet.  The 10-year ASCVD risk score (Arnett DK, et al., 2019) is: 3.2%   Values used to calculate the score:     Age: 66 years     Sex: Female     Is Non-Hispanic African American: Yes     Diabetic: No     Tobacco smoker: No     Systolic Blood Pressure: 300 mmHg     Is BP treated: Yes     HDL Cholesterol: 54 mg/dL     Total Cholesterol: 206 mg/dL   Anxiety: She is currently not taking the Lexapro.  She says she stopped taking it November 10.  She says that she feels like she is doing well.  She said it felt temporary due to circumstances.  She says that she got some help with her mother who requires a lot of assistance and that was causing her additional stress. She says she was just laid off and has been handling it well.  GAD 7 : Generalized Anxiety Score 06/02/2021 04/25/2021  Nervous, Anxious, on Edge 0 3  Control/stop worrying 0 0  Worry too much - different things 0 2  Trouble relaxing 0 3  Restless 0 3  Easily annoyed or irritable 0 2  Afraid - awful might happen 0 1  Total GAD 7 Score 0 14  Anxiety Difficulty Not difficult at all -      Depression screen [facility] 2/9 06/02/2021 05/16/2021 04/25/2021 03/14/2021 02/09/2021  Decreased Interest 0 0 1 0 0  Down, Depressed, Hopeless 0 0 2 0 0  PHQ - 2 Score 0 0 3 0 0  Altered sleeping 0 1 3 - -  Tired, decreased energy 0 1 3 - -  Change in appetite 0 0 3 - -  Feeling bad or failure about yourself  0 0 0 - -  Trouble concentrating 0 1 3 - -  Moving slowly or fidgety/restless 0 0 0 - -  Suicidal thoughts 0 0 0 - -  PHQ-9 Score 0 3 15 - -  Difficult doing work/chores Not difficult at all - - - -    Obesity: She says she used to take phentermine and it helped with her weight.  She says she has steadily gained weight over the last year.  Her current weight is 216 lbs with a BMI of 35.01. Last visit she weighed 214 lbs on 04/25/21.  Discussed  that phentermine can increase her blood pressure.  Advised her to not take if her blood pressure is elevated.   Relevant past medical, surgical, family and social history reviewed and updated as indicated. Interim medical history since our last visit reviewed. Allergies and medications reviewed and updated.  Review of Systems  Constitutional: Negative for fever, positive weight change.  Respiratory: Negative for cough and shortness of breath.   Cardiovascular: Negative for chest pain or palpitations.  Gastrointestinal: Negative for abdominal pain, no bowel changes.  Musculoskeletal: Negative for gait problem or joint swelling.  Skin: Negative for rash.  Neurological: Negative for dizziness or headache.  No other specific complaints in a complete review of systems (except as listed in HPI above).      Objective:    BP 124/82    Pulse 98    Temp 98.5 F (36.9 C) (Oral)    Resp 16    Ht 5' 6"  (1.676 m)    Wt 216 lb 14.4 oz (98.4 kg)    SpO2 98%    BMI 35.01 kg/m   Wt Readings from Last 3 Encounters:  06/02/21 216 lb 14.4 oz (98.4 kg)  04/25/21 214 lb (97.1 kg)  04/04/21 213 lb (96.6 kg)    Physical Exam  Constitutional: Patient appears  well-developed and well-nourished. Obese No distress.  HEENT: head atraumatic, normocephalic, pupils equal and reactive to light,  neck supple Cardiovascular: Normal rate, regular rhythm and normal heart sounds.  No murmur heard. No BLE edema. Pulmonary/Chest: Effort normal and breath sounds normal. No respiratory distress. Abdominal: Soft.  There is no tenderness. Psychiatric: Patient has a normal mood and affect. behavior is normal. Judgment and thought content normal.   Results for orders placed or performed in visit on 04/06/21  Comprehensive Metabolic Panel (CMET)  Result Value Ref Range   Glucose 92 70 - 99 mg/dL   BUN 10 6 - 24 mg/dL   Creatinine, Ser 0.95 0.57 - 1.00 mg/dL   eGFR 73 >59 mL/min/1.73   BUN/Creatinine Ratio 11 9 - 23   Sodium 140 134 - 144 mmol/L   Potassium 3.8 3.5 - 5.2 mmol/L   Chloride 102 96 - 106 mmol/L   CO2 25 20 - 29 mmol/L   Calcium 9.2 8.7 - 10.2 mg/dL   Total Protein 6.5 6.0 - 8.5 g/dL   Albumin 4.1 3.8 - 4.8 g/dL   Globulin, Total 2.4 1.5 - 4.5 g/dL   Albumin/Globulin Ratio 1.7 1.2 - 2.2   Bilirubin Total 0.7 0.0 - 1.2 mg/dL   Alkaline Phosphatase 49 44 - 121 IU/L   AST 23 0 - 40 IU/L   ALT 16 0 - 32 IU/L  CBC w/Diff/Platelet  Result Value Ref Range   WBC 3.8 3.4 - 10.8 x10E3/uL   RBC 5.05 3.77 - 5.28 x10E6/uL   Hemoglobin 11.9 11.1 - 15.9 g/dL   Hematocrit 36.6 34.0 - 46.6 %   MCV 73 (L) 79 - 97 fL   MCH 23.6 (L) 26.6 - 33.0 pg   MCHC 32.5 31.5 - 35.7 g/dL   RDW 14.7 11.7 - 15.4 %   Platelets 199 150 - 450 x10E3/uL   Neutrophils 48 Not Estab. %   Lymphs 34 Not Estab. %   Monocytes 15 Not Estab. %   Eos 2 Not Estab. %   Basos 1 Not Estab. %   Neutrophils Absolute 1.8 1.4 - 7.0 x10E3/uL   Lymphocytes Absolute 1.3 0.7 - 3.1 x10E3/uL   Monocytes Absolute 0.6  0.1 - 0.9 x10E3/uL   EOS (ABSOLUTE) 0.1 0.0 - 0.4 x10E3/uL   Basophils Absolute 0.0 0.0 - 0.2 x10E3/uL   Immature Granulocytes 0 Not Estab. %   Immature Grans (Abs) 0.0 0.0 - 0.1  x10E3/uL      Assessment & Plan:   1. Hypertension, benign  - losartan-hydrochlorothiazide (HYZAAR) 50-12.5 MG tablet; Take 1 tablet by mouth daily.  Dispense: 90 tablet; Refill: 3  2. Anxiety -continue to monitor  3. Hyperlipidemia, unspecified hyperlipidemia type  -continue to work on diet 4. Class 2 severe obesity due to excess calories with serious comorbidity and body mass index (BMI) of 35.0 to 35.9 in adult [facility]) -work on portion control and increasing physical activity - phentermine 37.5 MG capsule; Take 1/2 tab po with breakfast daily, hold if BP >140/80  Dispense: 30 capsule; Refill: 1   Follow up plan: Return in about 3 months (around 08/31/2021) for follow up.

## 2021-07-28 ENCOUNTER — Telehealth (INDEPENDENT_AMBULATORY_CARE_PROVIDER_SITE_OTHER): Payer: Managed Care, Other (non HMO) | Admitting: Internal Medicine

## 2021-07-28 ENCOUNTER — Encounter: Payer: Self-pay | Admitting: Internal Medicine

## 2021-07-28 ENCOUNTER — Other Ambulatory Visit: Payer: Self-pay

## 2021-07-28 VITALS — HR 94 | Temp 98.2°F | Resp 16 | Wt 214.2 lb

## 2021-07-28 DIAGNOSIS — R051 Acute cough: Secondary | ICD-10-CM

## 2021-07-28 DIAGNOSIS — J069 Acute upper respiratory infection, unspecified: Secondary | ICD-10-CM

## 2021-07-28 DIAGNOSIS — I10 Essential (primary) hypertension: Secondary | ICD-10-CM

## 2021-07-28 MED ORDER — FLUCONAZOLE 150 MG PO TABS: 150.0000 mg | ORAL_TABLET | Freq: Once | ORAL | 0 refills | Status: AC

## 2021-07-28 MED ORDER — METHYLPREDNISOLONE 4 MG PO TBPK: ORAL_TABLET | ORAL | 0 refills | Status: DC

## 2021-07-28 MED ORDER — AMOXICILLIN-POT CLAVULANATE 875-125 MG PO TABS: 1.0000 | ORAL_TABLET | Freq: Two times a day (BID) | ORAL | 0 refills | Status: AC

## 2021-07-28 NOTE — Progress Notes (Signed)
Acute Office Visit  Subjective:    Patient ID: Debra Stephens, female    DOB: 04-02-1971, 51 y.o.   MRN: [id number]  Chief Complaint  Patient presents with   Cough   Sinusitis    Pt believes has a sinus infection, Pt has a lot of pressure on forehead and ears. Pt did Home test- Negative 2/13   Nasal Congestion    HPI Patient is in today for sinus pressure couhg x 2 weeks.  URI Compliant:  -Fever: no -Cough: yes, productive clear but now yellow in color and thick  -Shortness of breath: no -Wheezing: no -Chest pain: no -Chest tightness: no -Chest congestion: yes -Nasal congestion: yes -Runny nose: yes, clear rhinorrhea  -Post nasal drip: yes -Sore throat: yes -Sinus pressure: yes -Headache: yes -Face pain: yes -Ear pain: no    -Ear pressure: yes bilateral -Sick contacts: yes -Context: worse -Recurrent sinusitis: yes -Relief with OTC cold/cough medications: no  -Treatments attempted:  Leta Baptist     Past Medical History:  Diagnosis Date   Abdominal tenderness, right upper quadrant    Abnormal Pap smear of cervix    Allergic rhinitis, cause unspecified    Anemia    Atypical chest pain 06/24/2013   Chronic headaches    Cough    Family history of breast cancer    Family history of pancreatic cancer    9/21 cancer genetic testing letter sent   Lumbago    Other and unspecified hyperlipidemia    Swelling    Thoracic or lumbosacral neuritis or radiculitis, unspecified    Unspecified symptom associated with female genital organs    Unspecified vitamin D deficiency     Past Surgical History:  Procedure Laterality Date   BREAST EXCISIONAL BIOPSY Left    age 50   COLONOSCOPY WITH PROPOFOL N/A 11/02/2017   Procedure: COLONOSCOPY WITH PROPOFOL;  Surgeon: Virgel Manifold, MD;  Location: [facility] ENDOSCOPY;  Service: Endoscopy;  Laterality: N/A;   TUBAL LIGATION     VAGINAL HYSTERECTOMY      Family History  Problem Relation Age of Onset   Stroke Mother     Hypertension Mother    Heart murmur Mother    Hypertension Father    Breast cancer Maternal Aunt    Ovarian cancer Maternal Aunt    Lupus Sister    Lupus Daughter    Liver cancer Maternal Grandfather    Cirrhosis Maternal Grandfather    Pancreatic cancer Maternal Grandfather    Hypertension Paternal Grandmother    Sickle cell anemia Sister    Thyroid cancer Sister    Seizures Sister    Hashimoto's thyroiditis Sister    Fibromyalgia Sister     Social History   Socioeconomic History   Marital status: Married    Spouse name: Not on file   Number of children: Not on file   Years of education: Not on file   Highest education level: Not on file  Occupational History   Not on file  Tobacco Use   Smoking status: Never   Smokeless tobacco: Never  Vaping Use   Vaping Use: Never used  Substance and Sexual Activity   Alcohol use: No   Drug use: No   Sexual activity: Yes  Other Topics Concern   Not on file  Social History Narrative   Not on file   Social Determinants of Health   Financial Resource Strain: Low Risk    Difficulty of Paying Living Expenses: Not hard at  all  Food Insecurity: No Food Insecurity   Worried About Charity fundraiser in the Last Year: Never true   Ran Out of Food in the Last Year: Never true  Transportation Needs: No Transportation Needs   Lack of Transportation (Medical): No   Lack of Transportation (Non-Medical): No  Physical Activity: Insufficiently Active   Days of Exercise per Week: 1 day   Minutes of Exercise per Session: 20 min  Stress: No Stress Concern Present   Feeling of Stress : Only a little  Social Connections: Engineer, building services of Communication with Friends and Family: More than three times a week   Frequency of Social Gatherings with Friends and Family: Once a week   Attends Religious Services: 1 to 4 times per year   Active Member of [employer] or Organizations: Yes   Attends Music therapist: More  than 4 times per year   Marital Status: Married  Human resources officer Violence: Not At Risk   Fear of Current or Ex-Partner: No   Emotionally Abused: No   Physically Abused: No   Sexually Abused: No    Outpatient Medications Prior to Visit  Medication Sig Dispense Refill   acetaminophen (TYLENOL) 325 MG tablet Take 650 mg by mouth as needed.     benzonatate (TESSALON) 100 MG capsule Take 1-2 capsules (100-200 mg total) by mouth 2 (two) times daily as needed. 40 capsule 0   clobetasol (TEMOVATE) 0.05 % external solution APPLY TO SCALP TWICE A DAY 50 mL 1   EPINEPHrine (EPIPEN 2-PAK) 0.3 mg/0.3 mL IJ SOAJ injection Inject 0.3 mg into the muscle as needed for anaphylaxis. 2 each 0   fexofenadine (ALLEGRA) 180 MG tablet Take 180 mg by mouth daily.     losartan-hydrochlorothiazide (HYZAAR) 50-12.5 MG tablet Take 1 tablet by mouth daily. 90 tablet 3   Multiple Vitamin (MULTIVITAMIN WITH MINERALS) TABS tablet Take 1 tablet by mouth daily.     olopatadine (PATANOL) 0.1 % ophthalmic solution Place 1 drop into both eyes 2 (two) times daily. 5 mL 2   phentermine 37.5 MG capsule Take 1/2 tab po with breakfast daily, hold if BP >140/80 30 capsule 1   triamcinolone (NASACORT) 55 MCG/ACT AERO nasal inhaler Place 2 sprays into the nose daily.     Vitamin D, Ergocalciferol, (DRISDOL) 1.25 MG (50000 UNIT) CAPS capsule Take 1 capsule (50,000 Units total) by mouth every 7 (seven) days. 4 capsule 2   No facility-administered medications prior to visit.    Allergies  Allergen Reactions   Erythromycin Other (See Comments)   Ibuprofen Hives   Vibramycin [Doxycycline Calcium] Hives    Review of Systems  Constitutional:  Negative for chills and fever.  HENT:  Positive for congestion, postnasal drip, rhinorrhea, sinus pressure, sinus pain and sore throat. Negative for ear discharge and ear pain.   Respiratory:  Positive for cough. Negative for shortness of breath and wheezing.   Cardiovascular:  Negative for  chest pain.  Gastrointestinal:  Negative for abdominal pain, diarrhea, nausea and vomiting.  Neurological:  Positive for headaches.      Objective:    Physical Exam Constitutional:      Appearance: Normal appearance.  HENT:     Head: Normocephalic and atraumatic.     Right Ear: Tympanic membrane, ear canal and external ear normal.     Left Ear: Tympanic membrane, ear canal and external ear normal.     Nose: Congestion present.     Mouth/Throat:  Mouth: Mucous membranes are moist.     Pharynx: Oropharynx is clear.  Eyes:     Conjunctiva/sclera: Conjunctivae normal.  Cardiovascular:     Rate and Rhythm: Normal rate and regular rhythm.  Pulmonary:     Effort: Pulmonary effort is normal.     Breath sounds: Normal breath sounds. No wheezing, rhonchi or rales.  Musculoskeletal:     Right lower leg: No edema.  Skin:    General: Skin is warm and dry.  Neurological:     General: No focal deficit present.     Mental Status: She is alert. Mental status is at baseline.  Psychiatric:        Mood and Affect: Mood normal.        Behavior: Behavior normal.    Pulse 94    Temp 98.2 F (36.8 C)    Resp 16    Wt 214 lb 3.2 oz (97.2 kg)    SpO2 99%    BMI 34.57 kg/m  Wt Readings from Last 3 Encounters:  07/28/21 214 lb 3.2 oz (97.2 kg)  06/02/21 216 lb 14.4 oz (98.4 kg)  04/25/21 214 lb (97.1 kg)    Health Maintenance Due  Topic Date Due   Hepatitis C Screening  Never done   COVID-19 Vaccine (3 - Booster for Pfizer series) 11/15/2019    There are no preventive care reminders to display for this patient.   Lab Results  Component Value Date   TSH 1.150 04/06/2021   Lab Results  Component Value Date   WBC 3.8 04/06/2021   HGB 11.9 04/06/2021   HCT 36.6 04/06/2021   MCV 73 (L) 04/06/2021   PLT 199 04/06/2021   Lab Results  Component Value Date   NA 140 04/06/2021   K 3.8 04/06/2021   CO2 25 04/06/2021   GLUCOSE 92 04/06/2021   BUN 10 04/06/2021   CREATININE 0.95  04/06/2021   BILITOT 0.7 04/06/2021   ALKPHOS 49 04/06/2021   AST 23 04/06/2021   ALT 16 04/06/2021   PROT 6.5 04/06/2021   ALBUMIN 4.1 04/06/2021   CALCIUM 9.2 04/06/2021   ANIONGAP 4 (L) 09/28/2017   EGFR 73 04/06/2021   Lab Results  Component Value Date   CHOL 206 (H) 04/06/2021   Lab Results  Component Value Date   HDL 54 04/06/2021   Lab Results  Component Value Date   LDLCALC 138 (H) 04/06/2021   Lab Results  Component Value Date   TRIG 77 04/06/2021   Lab Results  Component Value Date   CHOLHDL 3.8 04/06/2021   Lab Results  Component Value Date   HGBA1C 4.9 03/14/2021       Assessment & Plan:   1. Upper respiratory tract infection, unspecified type/Acute cough: Due to duration of symptoms and history of recurrent sinus infections, will treat with Augmentin x 5 days and a Medrol dosepack. Continue Flonase and Allegra. Sent in [place] as well as she is prone to yeast infections with antibiotics. Follow up as needed.  - Novel Coronavirus, NAA ([facility]) - amoxicillin-clavulanate (AUGMENTIN) 875-125 MG tablet; Take 1 tablet by mouth 2 (two) times daily for 5 days.  Dispense: 10 tablet; Refill: 0 - methylPREDNISolone (MEDROL DOSEPAK) 4 MG TBPK tablet; Day 1: Take 8 mg (2 tablets) before breakfast, 4 mg (1 tablet) after lunch, 4 mg (1 tablet) after supper, and 8 mg (2 tablets) at bedtime. Day 2:Take 4 mg (1 tablet) before breakfast, 4 mg (1 tablet) after lunch, 4 mg (  1 tablet) after supper, and 8 mg (2 tablets) at bedtime. Day 3: Take 4 mg (1 tablet) before breakfast, 4 mg (1 tablet) after lunch, 4 mg (1 tablet) after supper, and 4 mg (1 tablet) at bedtime. Day 4: Take 4 mg (1 tablet) before breakfast, 4 mg (1 tablet) after lunch, and 4 mg (1 tablet) at bedtime. Day 5: Take 4 mg (1 tablet) before breakfast and 4 mg (1 tablet) at bedtime. Day 6: Take 4 mg (1 tablet) before breakfast.  Dispense: 1 each; Refill: 0 - fluconazole (DIFLUCAN) 150 MG tablet; Take 1 tablet (150 mg  total) by mouth once for 1 dose.  Dispense: 3 tablet; Refill: 0   Teodora Medici, DO

## 2021-07-28 NOTE — Patient Instructions (Addendum)
It was great seeing you today!  Plan discussed at today's visit: -Antibiotic and steroid pack sent to pharmacy - take antibiotic twice a day for 5 days  -COVID results should come back tomorrow or Monday  -Can take Diflucan as needed for yeast infection   Follow up in: as needed   Take care and let us know if you have any questions or concerns prior to your next visit.  Dr. Rosana Berger

## 2021-07-29 LAB — SPECIMEN STATUS REPORT

## 2021-07-29 LAB — NOVEL CORONAVIRUS, NAA: SARS-CoV-2, NAA: NOT DETECTED

## 2021-08-11 NOTE — Progress Notes (Deleted)
Name: Debra Stephens   MRN: [id number]    DOB: 12-Nov-1970   Date:08/11/2021 ? ?     Progress Note ? ?Subjective ? ?Chief Complaint ? ?Follow Up ? ?HPI ? ?Dysthymia/Anxiety: she saw me about 6 weeks ago and was feeling overwhelmed . Her mother has been very sick,and she was going back and forth to take care of her., but now she has a CNA and it has been very helpful. Her daughter is pregnant and expecting twins but is now 23 weeks and no longer in the hospital. She has not been responsible for her grandchildren - she told her daughter her boundaries and also taking medication and it has really helped.   She has been sleeping better, hydroxizine was too strong - causing drowsiness. Not sleeping well now due to URI ?  ?Patient Active Problem List  ? Diagnosis Date Noted  ? 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 Hypertension, benign 04/22/2020  ? Vitamin B12 deficiency 01/29/2018  ? Recurrent sinusitis 12/14/2017  ? Dysmetabolic syndrome 11/08/2017  ? Chronic idiopathic constipation 08/13/2017  ? Obesity (BMI 30-39.9) 07/27/2017  ? 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 Vitamin D deficiency 11/22/2016  ? ANA positive 05/24/2016  ? H/O urticaria 09/08/2015  ? Angio-edema 09/08/2015  ? ? ?Past Surgical History:  ?Procedure Laterality Date  ? BREAST EXCISIONAL BIOPSY Left   ? age 51  ? COLONOSCOPY WITH PROPOFOL N/A 11/02/2017  ? Procedure: COLONOSCOPY WITH PROPOFOL;  Surgeon: Pasty Spillers, MD;  Location: [facility] END[facility]COPY;  Service: Endoscopy;  Laterality: N/A;  ? TUBAL LIGATION    ? VAGINAL HYSTERECTOMY    ? ? ?Family History  ?Problem Relation Age of Onset  ? Stroke Mother   ? Hypertension Mother   ? Heart murmur Mother   ? Hypertension Father   ? Breast cancer Maternal Aunt   ? Ovarian cancer Maternal Aunt   ? Lupus Sister   ? Lupus Daughter   ? Liver cancer Maternal Grandfather   ? Cirrhosis Maternal Grandfather   ? Pancreatic cancer Maternal Grandfather   ? Hypertension Paternal Grandmother   ? Sickle cell  anemia Sister   ? Thyroid cancer Sister   ? Seizures Sister   ? Hashimoto's thyroiditis Sister   ? Fibromyalgia Sister   ? ? ?Social History  ? ?Tobacco Use  ? Smoking status: Never  ? Smokeless tobacco: Never  ?Substance Use Topics  ? Alcohol use: No  ? ? ? ?Current Outpatient Medications:  ?  acetaminophen (TYLENOL) 325 MG tablet, Take 650 mg by mouth as needed., Disp: , Rfl:  ?  benzonatate (TESSALON) 100 MG capsule, Take 1-2 capsules (100-200 mg total) by mouth 2 (two) times daily as needed., Disp: 40 capsule, Rfl: 0 ?  EPINEPHrine (EPIPEN 2-PAK) 0.3 mg/0.3 mL IJ SOAJ injection, Inject 0.3 mg into the muscle as needed for anaphylaxis., Disp: 2 each, Rfl: 0 ?  fexofenadine (ALLEGRA) 180 MG tablet, Take 180 mg by mouth daily., Disp: , Rfl:  ?  losartan-hydrochlorothiazide (HYZAAR) 50-12.5 MG tablet, Take 1 tablet by mouth daily., Disp: 90 tablet, Rfl: 3 ?  methylPREDNISolone (MEDROL DOSEPAK) 4 MG TBPK tablet, Day 1: Take 8 mg (2 tablets) before breakfast, 4 mg (1 tablet) after lunch, 4 mg (1 tablet) after supper, and 8 mg (2 tablets) at bedtime. Day 2:Take 4 mg (1 tablet) before breakfast, 4 mg (1 tablet) after lunch, 4 mg (1 tablet) after supper, and 8 mg (2 tablets) at bedtime. Day 3: Take  4 mg (1 tablet) before breakfast, 4 mg (1 tablet) after lunch, 4 mg (1 tablet) after supper, and 4 mg (1 tablet) at bedtime. Day 4: Take 4 mg (1 tablet) before breakfast, 4 mg (1 tablet) after lunch, and 4 mg (1 tablet) at bedtime. Day 5: Take 4 mg (1 tablet) before breakfast and 4 mg (1 tablet) at bedtime. Day 6: Take 4 mg (1 tablet) before breakfast., Disp: 1 each, Rfl: 0 ?  Multiple Vitamin (MULTIVITAMIN WITH MINERALS) TABS tablet, Take 1 tablet by mouth daily., Disp: , Rfl:  ?  olopatadine (PATANOL) 0.1 % ophthalmic solution, Place 1 drop into both eyes 2 (two) times daily., Disp: 5 mL, Rfl: 2 ?  phentermine 37.5 MG capsule, Take 1/2 tab po with breakfast daily, hold if BP >140/80, Disp: 30 capsule, Rfl: 1 ?  triamcinolone  (NASACORT) 55 MCG/ACT AERO nasal inhaler, Place 2 sprays into the nose daily., Disp: , Rfl:  ?  Vitamin D, Ergocalciferol, (DRISDOL) 1.25 MG (50000 UNIT) CAPS capsule, Take 1 capsule (50,000 Units total) by mouth every 7 (seven) days., Disp: 4 capsule, Rfl: 2 ? ?Allergies  ?Allergen Reactions  ? Erythromycin Other (See Comments)  ? Ibuprofen Hives  ? Vibramycin [Doxycycline Calcium] Hives  ? ? ?I personally reviewed active problem list, medication list, allergies, family history, social history, health maintenance with the patient/caregiver today. ? ? ?ROS ? ?*** ? ?Objective ? ?There were no vitals filed for this visit. ? ?There is no height or weight on file to calculate BMI. ? ?Physical Exam ?*** ? ?Recent Results (from the past 2160 hour(s))  ?Novel Coronavirus, NAA ([facility])     Status: None  ? Collection Time: 07/28/21 12:00 AM  ? Specimen: Nasopharyngeal(NP) swabs in vial transport medium  ? Nasopharynge  Previous  ?Result Value Ref Range  ? SARS-CoV-2, NAA Not Detected Not Detected  ?  Comment: This nucleic acid amplification test was developed and its performance ?characteristics determined by [employer]. Nucleic acid ?amplification tests include RT-PCR and TMA. This test has not been ?FDA cleared or approved. This test has been authorized by FDA under ?an Emergency Use Authorization (EUA). This test is only authorized ?for the duration of time the declaration that circumstances exist ?justifying the authorization of the emergency use of in vitro ?diagnostic tests for detection of SARS-CoV-2 virus and/or diagnosis ?of COVID-19 infection under section 564(b)(1) of the Act, 21 U.S.C. ?360bbb-3(b) (1), unless the authorization is terminated or revoked ?sooner. ?When diagnostic testing is negative, the possibility of a false ?negative result should be considered in the context of a patient's ?recent exposures and the presence of clinical signs and symptoms ?consistent with COVID-19. An individual  without symptoms of COVID-19 ?and who is not shedding SARS-CoV-2 virus wo uld expect to have a ?negative (not detected) result in this assay. ?  ?Specimen status report     Status: None  ? Collection Time: 07/28/21 12:00 AM  ?Result Value Ref Range  ? specimen status report Comment   ?  Comment: Please note ?Please note ?The date and/or time of collection was not indicated on the ?requisition as required by state and federal law.  The date of ?receipt of the specimen was used as the collection date if not ?supplied. ?  ? ? ?PHQ2/9: ?Depression screen [facility] 2/9 07/28/2021 07/28/2021 06/02/2021 05/16/2021 04/25/2021  ?Decreased Interest 0 0 0 0 1  ?Down, Depressed, Hopeless 0 0 0 0 2  ?PHQ - 2 Score 0 0 0 0 3  ?  Altered sleeping 0 0 0 1 3  ?Tired, decreased energy 0 0 0 1 3  ?Change in appetite 0 0 0 0 3  ?Feeling bad or failure about yourself  0 0 0 0 0  ?Trouble concentrating 0 0 0 1 3  ?Moving slowly or fidgety/restless 0 0 0 0 0  ?Suicidal thoughts 0 0 0 0 0  ?PHQ-9 Score 0 0 0 3 15  ?Difficult doing work/chores Not difficult at all Not difficult at all Not difficult at all - -  ?Some recent data might be hidden  ?  ?phq 9 is {gen pos neg:[id number]} ? ? ?Fall Risk: ?Fall Risk  07/28/2021 07/28/2021 06/02/2021 05/16/2021 04/25/2021  ?Falls in the past year? 0 0 0 0 0  ?Number falls in past yr: 0 0 0 0 0  ?Injury with Fall? 0 0 0 0 0  ?Risk for fall due to : - No Fall Risks - No Fall Risks No Fall Risks  ?Follow up - Falls prevention discussed Falls evaluation completed Falls prevention discussed Falls prevention discussed  ? ? ? ? ?Functional Status Survey: ?  ? ? ? ?Assessment & Plan ? ?*** ?There are no diagnoses linked to this encounter.  ?

## 2021-08-12 ENCOUNTER — Ambulatory Visit: Payer: 59 | Admitting: Family Medicine

## 2021-08-12 NOTE — Progress Notes (Deleted)
Name: Debra Stephens   MRN: [id number]    DOB: September 26, 1970   Date:08/12/2021 ? ?     Progress Note ? ?Subjective ? ?Chief Complaint ? ?Follow up  ? ?HPI ? ?Dysthymia/Anxiety: she states she has been overwhelmed . Her mother has been very sick, still works by herself and she has to go back and forth to check on her mother. Her daughter is pregnant and expecting twins and recently admitted for pre term bleeding. She has been having to also take care of her grandchildren  ( 2 yo and 58 yo) she also worries about her husband that has uncontrolled DM, kidney function is dropping.  ?She states difficulty sleeping, feels nervous all the time, at times her heart races and unable to breath, sensation of doom at times. 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  ? ?She states able to work but the classes are at the end of the day and unable to focus.  ?Patient Active Problem List  ? Diagnosis Date Noted  ? 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 Hypertension, benign 04/22/2020  ? Vitamin B12 deficiency 01/29/2018  ? Recurrent sinusitis 12/14/2017  ? Dysmetabolic syndrome 11/08/2017  ? Chronic idiopathic constipation 08/13/2017  ? Obesity (BMI 30-39.9) 07/27/2017  ? 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 Vitamin D deficiency 11/22/2016  ? ANA positive 05/24/2016  ? H/O urticaria 09/08/2015  ? Angio-edema 09/08/2015  ? ? ?Past Surgical History:  ?Procedure Laterality Date  ? BREAST EXCISIONAL BIOPSY Left   ? age 24  ? COLONOSCOPY WITH PROPOFOL N/A 11/02/2017  ? Procedure: COLONOSCOPY WITH PROPOFOL;  Surgeon: Pasty Spillers, MD;  Location: [facility] END[facility];  Service: Endoscopy;  Laterality: N/A;  ? TUBAL LIGATION    ? VAGINAL HYSTERECTOMY    ? ? ?Family History  ?Problem Relation Age of Onset  ? Stroke Mother   ? Hypertension Mother   ? Heart murmur Mother   ? Hypertension Father   ? Breast cancer Maternal Aunt   ? Ovarian cancer Maternal Aunt   ? Lupus Sister   ?  Lupus Daughter   ? Liver cancer Maternal Grandfather   ? Cirrhosis Maternal Grandfather   ? Pancreatic cancer Maternal Grandfather   ? Hypertension Paternal Grandmother   ? Sickle cell anemia Sister   ? Thyroid cancer Sister   ? Seizures Sister   ? Hashimoto's thyroiditis Sister   ? Fibromyalgia Sister   ? ? ?Social History  ? ?Tobacco Use  ? Smoking status: Never  ? Smokeless tobacco: Never  ?Substance Use Topics  ? Alcohol use: No  ? ? ? ?Current Outpatient Medications:  ?  acetaminophen (TYLENOL) 325 MG tablet, Take 650 mg by mouth as needed., Disp: , Rfl:  ?  benzonatate (TESSALON) 100 MG capsule, Take 1-2 capsules (100-200 mg total) by mouth 2 (two) times daily as needed., Disp: 40 capsule, Rfl: 0 ?  EPINEPHrine (EPIPEN 2-PAK) 0.3 mg/0.3 mL IJ SOAJ injection, Inject 0.3 mg into the muscle as needed for anaphylaxis., Disp: 2 each, Rfl: 0 ?  fexofenadine (ALLEGRA) 180 MG tablet, Take 180 mg by mouth daily., Disp: , Rfl:  ?  losartan-hydrochlorothiazide (HYZAAR) 50-12.5 MG tablet, Take 1 tablet by mouth daily., Disp: 90 tablet, Rfl: 3 ?  methylPREDNISolone (MEDROL DOSEPAK) 4 MG TBPK tablet, Day 1: Take 8 mg (2 tablets) before breakfast, 4 mg (1 tablet) after lunch, 4 mg (1 tablet) after supper, and 8 mg (2 tablets)  at bedtime. Day 2:Take 4 mg (1 tablet) before breakfast, 4 mg (1 tablet) after lunch, 4 mg (1 tablet) after supper, and 8 mg (2 tablets) at bedtime. Day 3: Take 4 mg (1 tablet) before breakfast, 4 mg (1 tablet) after lunch, 4 mg (1 tablet) after supper, and 4 mg (1 tablet) at bedtime. Day 4: Take 4 mg (1 tablet) before breakfast, 4 mg (1 tablet) after lunch, and 4 mg (1 tablet) at bedtime. Day 5: Take 4 mg (1 tablet) before breakfast and 4 mg (1 tablet) at bedtime. Day 6: Take 4 mg (1 tablet) before breakfast., Disp: 1 each, Rfl: 0 ?  Multiple Vitamin (MULTIVITAMIN WITH MINERALS) TABS tablet, Take 1 tablet by mouth daily., Disp: , Rfl:  ?  olopatadine (PATANOL) 0.1 % ophthalmic solution, Place 1 drop  into both eyes 2 (two) times daily., Disp: 5 mL, Rfl: 2 ?  phentermine 37.5 MG capsule, Take 1/2 tab po with breakfast daily, hold if BP >140/80, Disp: 30 capsule, Rfl: 1 ?  triamcinolone (NASACORT) 55 MCG/ACT AERO nasal inhaler, Place 2 sprays into the nose daily., Disp: , Rfl:  ?  Vitamin D, Ergocalciferol, (DRISDOL) 1.25 MG (50000 UNIT) CAPS capsule, Take 1 capsule (50,000 Units total) by mouth every 7 (seven) days., Disp: 4 capsule, Rfl: 2 ? ?Allergies  ?Allergen Reactions  ? Erythromycin Other (See Comments)  ? Ibuprofen Hives  ? Vibramycin [Doxycycline Calcium] Hives  ? ? ?I personally reviewed {Reviewed:14835} with the patient/caregiver today. ? ? ?ROS ? ?*** ? ?Objective ? ?There were no vitals filed for this visit. ? ?There is no height or weight on file to calculate BMI. ? ?Physical Exam ?*** ? ?Recent Results (from the past 2160 hour(s))  ?Novel Coronavirus, NAA ([facility])     Status: None  ? Collection Time: 07/28/21 12:00 AM  ? Specimen: Nasopharyngeal(NP) swabs in vial transport medium  ? Nasopharynge  Previous  ?Result Value Ref Range  ? SARS-CoV-2, NAA Not Detected Not Detected  ?  Comment: This nucleic acid amplification test was developed and its performance ?characteristics determined by [employer]. Nucleic acid ?amplification tests include RT-PCR and TMA. This test has not been ?FDA cleared or approved. This test has been authorized by FDA under ?an Emergency Use Authorization (EUA). This test is only authorized ?for the duration of time the declaration that circumstances exist ?justifying the authorization of the emergency use of in vitro ?diagnostic tests for detection of SARS-CoV-2 virus and/or diagnosis ?of COVID-19 infection under section 564(b)(1) of the Act, 21 U.S.C. ?360bbb-3(b) (1), unless the authorization is terminated or revoked ?sooner. ?When diagnostic testing is negative, the possibility of a false ?negative result should be considered in the context of a  patient's ?recent exposures and the presence of clinical signs and symptoms ?consistent with COVID-19. An individual without symptoms of COVID-19 ?and who is not shedding SARS-CoV-2 virus wo uld expect to have a ?negative (not detected) result in this assay. ?  ?Specimen status report     Status: None  ? Collection Time: 07/28/21 12:00 AM  ?Result Value Ref Range  ? specimen status report Comment   ?  Comment: Please note ?Please note ?The date and/or time of collection was not indicated on the ?requisition as required by state and federal law.  The date of ?receipt of the specimen was used as the collection date if not ?supplied. ?  ? ? ?Diabetic Foot Exam: ?Diabetic Foot Exam - Simple   ?No data filed ?  ? ?*** ? ?  PHQ2/9: ?Depression screen [facility] 2/9 07/28/2021 07/28/2021 06/02/2021 05/16/2021 04/25/2021  ?Decreased Interest 0 0 0 0 1  ?Down, Depressed, Hopeless 0 0 0 0 2  ?PHQ - 2 Score 0 0 0 0 3  ?Altered sleeping 0 0 0 1 3  ?Tired, decreased energy 0 0 0 1 3  ?Change in appetite 0 0 0 0 3  ?Feeling bad or failure about yourself  0 0 0 0 0  ?Trouble concentrating 0 0 0 1 3  ?Moving slowly or fidgety/restless 0 0 0 0 0  ?Suicidal thoughts 0 0 0 0 0  ?PHQ-9 Score 0 0 0 3 15  ?Difficult doing work/chores Not difficult at all Not difficult at all Not difficult at all - -  ?Some recent data might be hidden  ?  ?phq 9 is {gen pos neg:315643} ?*** ? ?Fall Risk: ?Fall Risk  07/28/2021 07/28/2021 06/02/2021 05/16/2021 04/25/2021  ?Falls in the past year? 0 0 0 0 0  ?Number falls in past yr: 0 0 0 0 0  ?Injury with Fall? 0 0 0 0 0  ?Risk for fall due to : - No Fall Risks - No Fall Risks No Fall Risks  ?Follow up - Falls prevention discussed Falls evaluation completed Falls prevention discussed Falls prevention discussed  ? ?*** ? ? ?Functional Status Survey: ?  ?*** ? ? ?Assessment & Plan ? ?*** ?There are no diagnoses linked to this encounter. ?

## 2021-08-15 ENCOUNTER — Ambulatory Visit: Payer: Self-pay | Admitting: Family Medicine

## 2021-08-23 NOTE — Progress Notes (Signed)
Name: Debra Stephens   MRN: [id number]    DOB: Oct 22, 1970   Date:08/24/2021 ? ?     Progress Note ? ?Subjective ? ?Chief Complaint ? ?Follow Up ? ?HPI ? 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 Her daughter had twins recently She has not been responsible for her grandchildren . She continues to help her daughter raise her grandchildren. She was laid off since January 2023.  ? ?Hot flashes: she noticed symptoms over the past couple of months, she had a hysterectomy in her 76's , she has been waking up during the night with night sweats, has hot flashes during the day. Feeling tired, she also has dysthymia, advised effexor and clonidine qhs . ? 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  ? ?Obesity: her weight was in the 180 lbs for a while , however with stress she has been eating more and has been obese over the past year. She was given adipex but since she was given a capsule she could not cut in half. She has been drinking sodas, eating ice cream not exercising. She states she just joined the gym. Discussed importance of life style modification first , I don't recommend medications at this time.  ?  ?Left ear fullness: she was treated for an URI recently but continues to have left ear fullness. ? ?B12 deficiency: we will recheck level  ? ?Patient Active Problem List  ? Diagnosis Date Noted  ? Hemoglobin C (Hb-C) ([place]) 04/22/2020  ? Hypertension, benign 04/22/2020  ? Vitamin B12 deficiency 01/29/2018  ? Recurrent sinusitis 12/14/2017  ? Dysmetabolic syndrome [id number]  ? Chronic idiopathic constipation 08/13/2017  ? Obesity (BMI 30-39.9) 07/27/2017  ? 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 Vitamin D deficiency 11/22/2016  ? ANA positive 05/24/2016  ? H/O urticaria 09/08/2015  ? Angio-edema 09/08/2015  ? ? ?Past  Surgical History:  ?Procedure Laterality Date  ? BREAST EXCISIONAL BIOPSY Left   ? age 36  ? COLONOSCOPY WITH PROPOFOL N/A 11/02/2017  ? Procedure: COLONOSCOPY WITH PROPOFOL;  Surgeon: Virgel Manifold, MD;  Location: [facility] END[facility];  Service: Endoscopy;  Laterality: N/A;  ? TUBAL LIGATION    ? VAGINAL HYSTERECTOMY    ? ? ?Family History  ?Problem Relation Age of Onset  ? Stroke Mother   ? Hypertension Mother   ? Heart murmur Mother   ? Hypertension Father   ? Breast cancer Maternal Aunt   ? Ovarian cancer Maternal Aunt   ? Lupus Sister   ? Lupus Daughter   ? Liver cancer Maternal Grandfather   ? Cirrhosis Maternal Grandfather   ? Pancreatic cancer Maternal Grandfather   ? Hypertension Paternal Grandmother   ? Sickle cell anemia Sister   ? Thyroid cancer Sister   ? Seizures Sister   ? Hashimoto's thyroiditis Sister   ? Fibromyalgia Sister   ? ? ?Social History  ? ?Tobacco Use  ? Smoking status: Never  ? Smokeless tobacco: Never  ?Substance Use Topics  ? Alcohol use: No  ? ? ? ?Current Outpatient Medications:  ?  acetaminophen (TYLENOL) 325 MG tablet, Take 650 mg by mouth as needed., Disp: , Rfl:  ?  EPINEPHrine (EPIPEN 2-PAK) 0.3 mg/0.3 mL IJ SOAJ injection, Inject 0.3 mg into the muscle as needed for  anaphylaxis., Disp: 2 each, Rfl: 0 ?  fexofenadine (ALLEGRA) 180 MG tablet, Take 180 mg by mouth daily., Disp: , Rfl:  ?  losartan-hydrochlorothiazide (HYZAAR) 50-12.5 MG tablet, Take 1 tablet by mouth daily., Disp: 90 tablet, Rfl: 3 ?  Multiple Vitamin (MULTIVITAMIN WITH MINERALS) TABS tablet, Take 1 tablet by mouth daily., Disp: , Rfl:  ?  olopatadine (PATANOL) 0.1 % ophthalmic solution, Place 1 drop into both eyes 2 (two) times daily., Disp: 5 mL, Rfl: 2 ?  triamcinolone (NASACORT) 55 MCG/ACT AERO nasal inhaler, Place 2 sprays into the nose daily., Disp: , Rfl:  ?  Vitamin D, Ergocalciferol, (DRISDOL) 1.25 MG (50000 UNIT) CAPS capsule, Take 1 capsule (50,000 Units total) by mouth every 7 (seven) days., Disp: 4  capsule, Rfl: 2 ?  phentermine 37.5 MG capsule, Take 1/2 tab po with breakfast daily, hold if BP >140/80 (Patient not taking: Reported on 08/24/2021), Disp: 30 capsule, Rfl: 1 ? ?Allergies  ?Allergen Reactions  ? Erythromycin Other (See Comments)  ? Ibuprofen Hives  ? Vibramycin [Doxycycline Calcium] Hives  ? ? ?I personally reviewed active problem list, medication list, allergies, family history, social history, health maintenance with the patient/caregiver today. ? ? ?ROS ? ?Constitutional: Negative for fever or weight change.  ?Respiratory: Negative for cough and shortness of breath.   ?Cardiovascular: Negative for chest pain or palpitations.  ?Gastrointestinal: Negative for abdominal pain, no bowel changes.  ?Musculoskeletal: Negative for gait problem or joint swelling.  ?Skin: Negative for rash.  ?Neurological: Negative for dizziness or headache.  ?No other specific complaints in a complete review of systems (except as listed in HPI above).  ? ?Objective ? ?Vitals:  ? 08/24/21 0953  ?BP: 122/68  ?Pulse: 91  ?Resp: 16  ?SpO2: 95%  ?Weight: 218 lb (98.9 kg)  ?Height: 5\' 6"  (1.676 m)  ? ? ?Body mass index is 35.19 kg/m?. ? ?Physical Exam ? ?Constitutional: Patient appears well-developed and well-nourished. Obese  No distress.  ?HEENT: head atraumatic, normocephalic, pupils equal and reactive to light, neck supple, throat within normal limits ?Cardiovascular: Normal rate, regular rhythm and normal heart sounds.  No murmur heard. No BLE edema. ?Pulmonary/Chest: Effort normal and breath sounds normal. No respiratory distress. ?Abdominal: Soft.  There is no tenderness. ?Psychiatric: Patient has a normal mood and affect. behavior is normal. Judgment and thought content normal.  ? ?Recent Results (from the past 2160 hour(s))  ?Novel Coronavirus, NAA ([facility])     Status: None  ? Collection Time: 07/28/21 12:00 AM  ? Specimen: Nasopharyngeal(NP) swabs in vial transport medium  ? Nasopharynge  Previous  ?Result Value Ref  Range  ? SARS-CoV-2, NAA Not Detected Not Detected  ?  Comment: This nucleic acid amplification test was developed and its performance ?characteristics determined by [employer]. Nucleic acid ?amplification tests include RT-PCR and TMA. This test has not been ?FDA cleared or approved. This test has been authorized by FDA under ?an Emergency Use Authorization (EUA). This test is only authorized ?for the duration of time the declaration that circumstances exist ?justifying the authorization of the emergency use of in vitro ?diagnostic tests for detection of SARS-CoV-2 virus and/or diagnosis ?of COVID-19 infection under section 564(b)(1) of the Act, 21 U.S.C. ?360bbb-3(b) (1), unless the authorization is terminated or revoked ?sooner. ?When diagnostic testing is negative, the possibility of a false ?negative result should be considered in the context of a patient's ?recent exposures and the presence of clinical signs and symptoms ?consistent with COVID-19. An individual without symptoms of  COVID-19 ?and who is not shedding SARS-CoV-2 virus wo uld expect to have a ?negative (not detected) result in this assay. ?  ?Specimen status report     Status: None  ? Collection Time: 07/28/21 12:00 AM  ?Result Value Ref Range  ? specimen status report Comment   ?  Comment: Please note ?Please note ?The date and/or time of collection was not indicated on the ?requisition as required by state and federal law.  The date of ?receipt of the specimen was used as the collection date if not ?supplied. ?  ? ? ?PHQ2/9: ?Depression screen [facility] 2/9 08/24/2021 07/28/2021 07/28/2021 06/02/2021 05/16/2021  ?Decreased Interest 0 0 0 0 0  ?Down, Depressed, Hopeless 0 0 0 0 0  ?PHQ - 2 Score 0 0 0 0 0  ?Altered sleeping 3 0 0 0 1  ?Tired, decreased energy 0 0 0 0 1  ?Change in appetite 0 0 0 0 0  ?Feeling bad or failure about yourself  0 0 0 0 0  ?Trouble concentrating 0 0 0 0 1  ?Moving slowly or fidgety/restless 0 0 0 0 0  ?Suicidal thoughts  0 0 0 0 0  ?PHQ-9 Score 3 0 0 0 3  ?Difficult doing work/chores - Not difficult at all Not difficult at all Not difficult at all -  ?Some recent data might be hidden  ?  ?phq 9 is negative  ? ? ?Fall Ri

## 2021-08-24 ENCOUNTER — Encounter: Payer: Self-pay | Admitting: Family Medicine

## 2021-08-24 ENCOUNTER — Ambulatory Visit (INDEPENDENT_AMBULATORY_CARE_PROVIDER_SITE_OTHER): Payer: Managed Care, Other (non HMO) | Admitting: Family Medicine

## 2021-08-24 VITALS — BP 122/68 | HR 91 | Resp 16 | Ht 66.0 in | Wt 218.0 lb

## 2021-08-24 DIAGNOSIS — H938X2 Other specified disorders of left ear: Secondary | ICD-10-CM

## 2021-08-24 DIAGNOSIS — D582 Other hemoglobinopathies: Secondary | ICD-10-CM

## 2021-08-24 DIAGNOSIS — Z1159 Encounter for screening for other viral diseases: Secondary | ICD-10-CM

## 2021-08-24 DIAGNOSIS — E669 Obesity, unspecified: Secondary | ICD-10-CM

## 2021-08-24 DIAGNOSIS — E538 Deficiency of other specified B group vitamins: Secondary | ICD-10-CM

## 2021-08-24 DIAGNOSIS — N951 Menopausal and female climacteric states: Secondary | ICD-10-CM

## 2021-08-24 MED ORDER — FLUTICASONE PROPIONATE 50 MCG/ACT NA SUSP: 2.0000 | Freq: Every day | NASAL | 6 refills | Status: DC

## 2021-08-24 MED ORDER — CLONIDINE HCL 0.1 MG PO TABS: 0.1000 mg | ORAL_TABLET | Freq: Three times a day (TID) | ORAL | 0 refills | Status: DC

## 2021-08-24 MED ORDER — VENLAFAXINE HCL ER 37.5 MG PO CP24: 37.5000 mg | ORAL_CAPSULE | Freq: Every day | ORAL | 0 refills | Status: DC

## 2021-08-31 ENCOUNTER — Other Ambulatory Visit: Payer: Self-pay

## 2021-12-02 ENCOUNTER — Ambulatory Visit: Payer: Self-pay

## 2021-12-02 ENCOUNTER — Ambulatory Visit: Payer: Managed Care, Other (non HMO) | Admitting: Family Medicine

## 2021-12-06 ENCOUNTER — Ambulatory Visit: Payer: Managed Care, Other (non HMO) | Admitting: Family Medicine

## 2021-12-09 ENCOUNTER — Ambulatory Visit (INDEPENDENT_AMBULATORY_CARE_PROVIDER_SITE_OTHER): Payer: Commercial Managed Care - HMO | Admitting: Physician Assistant

## 2021-12-09 ENCOUNTER — Other Ambulatory Visit: Payer: Self-pay

## 2021-12-09 ENCOUNTER — Encounter: Payer: Self-pay | Admitting: Physician Assistant

## 2021-12-09 VITALS — BP 140/68 | HR 100 | Temp 97.7°F | Resp 16 | Ht 66.0 in | Wt 224.3 lb

## 2021-12-09 DIAGNOSIS — B379 Candidiasis, unspecified: Secondary | ICD-10-CM | POA: Diagnosis not present

## 2021-12-09 DIAGNOSIS — N951 Menopausal and female climacteric states: Secondary | ICD-10-CM

## 2021-12-09 DIAGNOSIS — J302 Other seasonal allergic rhinitis: Secondary | ICD-10-CM | POA: Insufficient documentation

## 2021-12-09 DIAGNOSIS — E538 Deficiency of other specified B group vitamins: Secondary | ICD-10-CM

## 2021-12-09 DIAGNOSIS — T3695XA Adverse effect of unspecified systemic antibiotic, initial encounter: Secondary | ICD-10-CM | POA: Diagnosis not present

## 2021-12-09 DIAGNOSIS — J329 Chronic sinusitis, unspecified: Secondary | ICD-10-CM

## 2021-12-09 DIAGNOSIS — Z1159 Encounter for screening for other viral diseases: Secondary | ICD-10-CM

## 2021-12-09 MED ORDER — MONTELUKAST SODIUM 10 MG PO TABS: 10.0000 mg | ORAL_TABLET | Freq: Every day | ORAL | 3 refills | Status: DC

## 2021-12-09 MED ORDER — AMOXICILLIN-POT CLAVULANATE 875-125 MG PO TABS: 1.0000 | ORAL_TABLET | Freq: Two times a day (BID) | ORAL | 0 refills | Status: DC

## 2021-12-09 MED ORDER — DM-GUAIFENESIN ER 30-600 MG PO TB12: 1.0000 | ORAL_TABLET | Freq: Two times a day (BID) | ORAL | 0 refills | Status: DC

## 2021-12-09 MED ORDER — FLUCONAZOLE 150 MG PO TABS: 150.0000 mg | ORAL_TABLET | Freq: Once | ORAL | 0 refills | Status: AC

## 2021-12-09 NOTE — Assessment & Plan Note (Signed)
Chronic, recurrent  Notes that Allegra and Flonase do not seem to be providing adequate relief Recommend she take a break from Flonase since she seems to be having nasal irritation with continued use.  Will add Montelukast today for assistance with relief Recommend she stop intermittent Zyrtec and Benadryl  Follow up as needed.

## 2021-12-09 NOTE — Assessment & Plan Note (Addendum)
Acute, recurrent, ongoing symptoms for approx 2 weeks  Reports she is having nasal congestion, post nasal drainage, sinus pressure, ear pain She has been using Mucinex, Allegra and intermittent Zyrtec without much relief. Recommend using Tylenol and ibuprofen for body aches and pain - stay well hydrated  Will provide Augmentin PO BID x 7days to assist with resolution along with Mucinex 600 mg for relief Follow up as needed

## 2021-12-09 NOTE — Progress Notes (Signed)
Acute Office Visit   Patient: Debra Stephens   DOB: 14-Mar-1971   51 y.o. Female  MRN: [id number] Visit Date: 12/09/2021  Today's healthcare provider: Oswaldo Conroy [place] Hedman, PA-C  Introduced myself to the patient as a Secondary school teacher and provided education on APPs in clinical practice.    Chief Complaint  Patient presents with   Cough    Onset for almost 2 weeks   Nasal Congestion    drainage   Subjective    HPI HPI     Cough    Additional comments: Onset for almost 2 weeks        Nasal Congestion    Additional comments: drainage      Last edited by Forde Radon, CMA on 12/09/2021  3:33 PM.      Reports this has been ongoing for about 2 weeks.  Thinks symptoms are getting somewhat better but still has lingering symptoms Reports she is having nasal congestion, post nasal drainage She is using saline sprays, Mucinex but it is not helping enough for relief She has been using Allegra  in the day and Zyrtec at night  Reports two of her grandchildren were recently diagnosed with Strep throat. - states she was already sick with her symptoms at the time she was around them.    Medications: Outpatient Medications Prior to Visit  Medication Sig   acetaminophen (TYLENOL) 325 MG tablet Take 650 mg by mouth as needed.   cloNIDine (CATAPRES) 0.1 MG tablet Take 1 tablet (0.1 mg total) by mouth 3 (three) times daily.   EPINEPHrine (EPIPEN 2-PAK) 0.3 mg/0.3 mL IJ SOAJ injection Inject 0.3 mg into the muscle as needed for anaphylaxis.   fexofenadine (ALLEGRA) 180 MG tablet Take 180 mg by mouth daily.   fluticasone (FLONASE) 50 MCG/ACT nasal spray Place 2 sprays into both nostrils daily.   losartan-hydrochlorothiazide (HYZAAR) 50-12.5 MG tablet Take 1 tablet by mouth daily.   Multiple Vitamin (MULTIVITAMIN WITH MINERALS) TABS tablet Take 1 tablet by mouth daily.   olopatadine (PATANOL) 0.1 % ophthalmic solution Place 1 drop into both eyes 2 (two) times daily.   venlafaxine  XR (EFFEXOR XR) 37.5 MG 24 hr capsule Take 1 capsule (37.5 mg total) by mouth daily with breakfast.   Vitamin D, Ergocalciferol, (DRISDOL) 1.25 MG (50000 UNIT) CAPS capsule Take 1 capsule (50,000 Units total) by mouth every 7 (seven) days.   No facility-administered medications prior to visit.    Review of Systems  Constitutional:  Positive for appetite change, fatigue and fever. Negative for chills.  HENT:  Positive for ear pain, postnasal drip, sore throat and voice change. Negative for sinus pressure and sinus pain.   Respiratory:  Positive for cough. Negative for shortness of breath and wheezing.   Gastrointestinal:  Negative for diarrhea, nausea and vomiting.  Musculoskeletal:  Negative for myalgias.  Neurological:  Negative for dizziness, light-headedness and headaches.       Objective    BP 140/68   Pulse 100   Temp 97.7 F (36.5 C) (Oral)   Resp 16   Ht 5\' 6"  (1.676 m)   Wt 224 lb 4.8 oz (101.7 kg)   SpO2 98%   BMI 36.20 kg/m    Physical Exam Vitals reviewed.  Constitutional:      Appearance: Normal appearance.  HENT:     Right Ear: Hearing normal. Tenderness present. A middle ear effusion is present. Tympanic membrane is erythematous and retracted. Tympanic membrane is  not injected, scarred, perforated or bulging.     Left Ear: Hearing normal. Tympanic membrane is erythematous. Tympanic membrane is not injected, scarred, perforated or bulging.     Nose:     Right Turbinates: Not enlarged, swollen or pale.     Left Turbinates: Enlarged and swollen.     Mouth/Throat:     Lips: Pink.     Pharynx: No pharyngeal swelling, oropharyngeal exudate, posterior oropharyngeal erythema or uvula swelling.  Eyes:     General: Lids are normal.     Extraocular Movements: Extraocular movements intact.     Conjunctiva/sclera: Conjunctivae normal.     Pupils: Pupils are equal, round, and reactive to light.  Cardiovascular:     Rate and Rhythm: Normal rate and regular rhythm.      Pulses: Normal pulses.     Heart sounds: Normal heart sounds.  Pulmonary:     Effort: Pulmonary effort is normal.     Breath sounds: Normal breath sounds. No decreased breath sounds, wheezing, rhonchi or rales.  Musculoskeletal:     Right lower leg: No edema.     Left lower leg: No edema.  Lymphadenopathy:     Head:     Right side of head: No submental or submandibular adenopathy.     Left side of head: No submental or submandibular adenopathy.     Upper Body:     Right upper body: No supraclavicular adenopathy.     Left upper body: No supraclavicular adenopathy.  Neurological:     Mental Status: She is alert.       No results found for any visits on 12/09/21.  Assessment & Plan     Problem List Items Addressed This Visit       Respiratory   Recurrent sinusitis - Primary    Acute, recurrent, ongoing symptoms for approx 2 weeks  Reports she is having nasal congestion, post nasal drainage, sinus pressure, ear pain She has been using Mucinex, Allegra and intermittent Zyrtec without much relief. Recommend using Tylenol and ibuprofen for body aches and pain - stay well hydrated  Will provide Augmentin PO BID x 7days to assist with resolution along with Mucinex 600 mg for relief Follow up as needed       Relevant Medications   amoxicillin-clavulanate (AUGMENTIN) 875-125 MG tablet   dextromethorphan-guaiFENesin (MUCINEX DM) 30-600 MG 12hr tablet   fluconazole (DIFLUCAN) 150 MG tablet     Other   Seasonal allergies    Chronic, recurrent  Notes that Allegra and Flonase do not seem to be providing adequate relief Recommend she take a break from Flonase since she seems to be having nasal irritation with continued use.  Will add Montelukast today for assistance with relief Recommend she stop intermittent Zyrtec and Benadryl  Follow up as needed.       Relevant Medications   montelukast (SINGULAIR) 10 MG tablet   Other Visit Diagnoses     Antibiotic-induced yeast  infection       Relevant Medications   fluconazole (DIFLUCAN) 150 MG tablet        No follow-ups on file.   I, Tasia Liz E Zakyria Metzinger, PA-C, have reviewed all documentation for this visit. The documentation on 12/09/21 for the exam, diagnosis, procedures, and orders are all accurate and complete.   Jacquelin Hawking, MHS, PA-C [facility]    No follow-ups on file.

## 2021-12-09 NOTE — Patient Instructions (Signed)
I recommend the following for your allergies and sinus infection  I have sent in a script for Augmentin to be taken by mouth twice per day for 7 days I have sent in Diflucan for antibiotic induced yeast infection. You can take this every 72 hours as needed  I recommend using the Montelukast to assist with the seasonal allergies. Take this once per day. You can continue taking the Flonase and Allegra for allergies as well.   Let us know if you have any concerns or further symptoms

## 2022-01-02 ENCOUNTER — Other Ambulatory Visit: Payer: Self-pay

## 2022-03-10 ENCOUNTER — Ambulatory Visit (INDEPENDENT_AMBULATORY_CARE_PROVIDER_SITE_OTHER): Payer: Commercial Managed Care - HMO | Admitting: Family Medicine

## 2022-03-10 ENCOUNTER — Encounter: Payer: Self-pay | Admitting: Family Medicine

## 2022-03-10 VITALS — BP 142/76 | HR 84 | Temp 97.8°F | Resp 16 | Ht 66.0 in | Wt 217.2 lb

## 2022-03-10 DIAGNOSIS — I10 Essential (primary) hypertension: Secondary | ICD-10-CM | POA: Diagnosis not present

## 2022-03-10 DIAGNOSIS — T3695XA Adverse effect of unspecified systemic antibiotic, initial encounter: Secondary | ICD-10-CM

## 2022-03-10 DIAGNOSIS — B379 Candidiasis, unspecified: Secondary | ICD-10-CM | POA: Diagnosis not present

## 2022-03-10 DIAGNOSIS — J209 Acute bronchitis, unspecified: Secondary | ICD-10-CM

## 2022-03-10 DIAGNOSIS — E8881 Metabolic syndrome: Secondary | ICD-10-CM

## 2022-03-10 DIAGNOSIS — Z1159 Encounter for screening for other viral diseases: Secondary | ICD-10-CM

## 2022-03-10 DIAGNOSIS — N951 Menopausal and female climacteric states: Secondary | ICD-10-CM

## 2022-03-10 DIAGNOSIS — Z23 Encounter for immunization: Secondary | ICD-10-CM

## 2022-03-10 DIAGNOSIS — J329 Chronic sinusitis, unspecified: Secondary | ICD-10-CM

## 2022-03-10 MED ORDER — ALBUTEROL SULFATE HFA 108 (90 BASE) MCG/ACT IN AERS: 2.0000 | INHALATION_SPRAY | Freq: Four times a day (QID) | RESPIRATORY_TRACT | 0 refills | Status: AC | PRN

## 2022-03-10 MED ORDER — PREDNISONE 20 MG PO TABS: 40.0000 mg | ORAL_TABLET | Freq: Every day | ORAL | 0 refills | Status: AC

## 2022-03-10 MED ORDER — AMOXICILLIN-POT CLAVULANATE 875-125 MG PO TABS: 1.0000 | ORAL_TABLET | Freq: Two times a day (BID) | ORAL | 0 refills | Status: AC

## 2022-03-10 MED ORDER — FLUCONAZOLE 150 MG PO TABS: 150.0000 mg | ORAL_TABLET | ORAL | 0 refills | Status: DC | PRN

## 2022-03-10 NOTE — Progress Notes (Signed)
Name: Daryl EasternMachelle A Depasquale   MRN: [id number]    DOB: Nov 06, 1970   Date:03/10/2022       Progress Note  Chief Complaint  Patient presents with   Follow-up   Hypertension    Home reading been high pt states mainly when just finished taking medications in the morning. Only time she checks her BP. Average 140s-150s and diastolic 70s.     Subjective:   Daryl EasternMachelle A Glaza is a 51 y.o. female, presents to clinic for f/up, BP elevated as well  Last several OV here in clinic reviewed by me today, last routine done by Dr. Carlynn PurlSowles -she never completed lab work or did medications since her last visit with Dr. 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 Her daughter had twins recently She has not been responsible for her grandchildren . She continues to help her daughter raise her grandchildren. She was laid off since January 2023.  Pt was prescribed  Effexor and clonidine and some labs were done   Hot flashes: she noticed symptoms over the past couple of months, she had a hysterectomy in her 30's , she has been waking up during the night with night sweats, has hot flashes during the day. Feeling tired, she also has dysthymia, advised effexor and clonidine qhs .   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 Orlie DakinFinnegan for leucopenia and found on hemoglobin electrophoresis.  Stable  Obesity: her weight was in the 180 lbs for a while , however with stress she has been eating more and has been obese over the past year. She was given adipex but since she was given a capsule she could not cut in half. She has been drinking sodas, eating ice cream not exercising. She states she just joined the gym. Discussed importance of life style modification first , I don't recommend medications at this time.  She was counseled on diet/lifestyle efforts She has lost some weight  since June, phentermine is on the chart? Last Rx per controlled substance database was Dec 2022 Wt Readings from Last 5 Encounters:  03/10/22 217 lb 3.2 oz (98.5 kg)  12/09/21 224 lb 4.8 oz (101.7 kg)  08/24/21 218 lb (98.9 kg)  07/28/21 214 lb 3.2 oz (97.2 kg)  06/02/21 216 lb 14.4 oz (98.4 kg)   BMI Readings from Last 5 Encounters:  03/10/22 35.06 kg/m  12/09/21 36.20 kg/m  08/24/21 35.19 kg/m  07/28/21 34.57 kg/m  06/02/21 35.01 kg/m     B12 deficiency: we will recheck level  Lab Results  Component Value Date   VITAMINB12 436 03/15/2020   Hypertension: Blood pressure is elevated today and has been running high sometimes at home but she also has readings in the 120s over 60s Currently managed on losartan-HCTZ 50-12.5, BP at home running 140-150/s SPB Pt reports good med compliance and denies any SE.   Blood pressure today is elevated BP Readings from Last 3 Encounters:  03/10/22 (!) 148/74  12/09/21 140/68  08/24/21 122/68   Pt denies CP, SOB, exertional sx, LE edema, palpitation, Ha's, visual disturbances, lightheadedness, hypotension, syncope.  She also complains of recurrent sinus pain and pressure for 3 weeks she was recently treated for sinus infection with Augmentin and it was effective at making her symptoms improve, she does tend to have worse sinus  symptoms, allergies, coughing around the change of the season so this is consistent with her history she endorses a dry cough, her ribs hurt with coughing, she does feel some shortness of breath but she is not having any fever sweats or chills or dyspnea on exertion she does have tenderness and pain to her face and sinuses and continued congestion she has been taking Allegra and Flonase  She also endorses multiple life stressors her husband was recently diagnosed with metastatic cancer and they have separated, she is also by home   Current Outpatient Medications:    acetaminophen (TYLENOL) 325 MG tablet, Take 650 mg by  mouth as needed., Disp: , Rfl:    cloNIDine (CATAPRES) 0.1 MG tablet, Take 1 tablet (0.1 mg total) by mouth 3 (three) times daily., Disp: 30 tablet, Rfl: 0   dextromethorphan-guaiFENesin (MUCINEX DM) 30-600 MG 12hr tablet, Take 1 tablet by mouth 2 (two) times daily., Disp: 10 tablet, Rfl: 0   EPINEPHrine (EPIPEN 2-PAK) 0.3 mg/0.3 mL IJ SOAJ injection, Inject 0.3 mg into the muscle as needed for anaphylaxis., Disp: 2 each, Rfl: 0   fexofenadine (ALLEGRA) 180 MG tablet, Take 180 mg by mouth daily., Disp: , Rfl:    fluticasone (FLONASE) 50 MCG/ACT nasal spray, Place 2 sprays into both nostrils daily., Disp: 16 g, Rfl: 6   losartan-hydrochlorothiazide (HYZAAR) 50-12.5 MG tablet, Take 1 tablet by mouth daily., Disp: 90 tablet, Rfl: 3   montelukast (SINGULAIR) 10 MG tablet, Take 1 tablet (10 mg total) by mouth at bedtime., Disp: 30 tablet, Rfl: 3   Multiple Vitamin (MULTIVITAMIN WITH MINERALS) TABS tablet, Take 1 tablet by mouth daily., Disp: , Rfl:    olopatadine (PATANOL) 0.1 % ophthalmic solution, Place 1 drop into both eyes 2 (two) times daily., Disp: 5 mL, Rfl: 2   venlafaxine XR (EFFEXOR XR) 37.5 MG 24 hr capsule, Take 1 capsule (37.5 mg total) by mouth daily with breakfast., Disp: 30 capsule, Rfl: 0   Vitamin D, Ergocalciferol, (DRISDOL) 1.25 MG (50000 UNIT) CAPS capsule, Take 1 capsule (50,000 Units total) by mouth every 7 (seven) days., Disp: 4 capsule, Rfl: 2   amoxicillin-clavulanate (AUGMENTIN) 875-125 MG tablet, Take 1 tablet by mouth 2 (two) times daily. 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   Vitamin D deficiency 11/22/2016   ANA positive 05/24/2016   H/O urticaria 09/08/2015   Angio-edema 09/08/2015    Past Surgical History:  Procedure Laterality Date   BREAST EXCISIONAL BIOPSY Left    age 35   COLONOSCOPY WITH PROPOFOL N/A 11/02/2017   Procedure: COLONOSCOPY WITH PROPOFOL;  Surgeon: Pasty Spillers, MD;  Location: [facility];  Service: Endoscopy;  Laterality: N/A;   TUBAL LIGATION     VAGINAL HYSTERECTOMY      Family History  Problem Relation Age of Onset   Stroke Mother    Hypertension Mother    Heart murmur Mother    Hypertension Father    Breast cancer Maternal  Aunt    Ovarian cancer Maternal Aunt    Lupus Sister    Lupus Daughter    Liver cancer Maternal Grandfather    Cirrhosis Maternal Grandfather    Pancreatic cancer Maternal Grandfather    Hypertension Paternal Grandmother    Sickle cell anemia Sister    Thyroid cancer Sister    Seizures Sister    Hashimoto's thyroiditis Sister    Fibromyalgia Sister     Social History   Tobacco Use   Smoking status: Never   Smokeless tobacco: Never  Vaping Use   Vaping Use: Never used  Substance Use Topics   Alcohol use: No   Drug use: No     Allergies  Allergen Reactions   Erythromycin Other (See Comments)   Ibuprofen Hives   Levaquin [Levofloxacin] Other (See Comments)    Nausea and metallic taste mouth   Vibramycin [Doxycycline Calcium] Hives    Health Maintenance  Topic Date Due   Hepatitis C Screening  Never done   COVID-19 Vaccine (3 - Pfizer series) 03/26/2022 (Originally 11/15/2019)   Zoster Vaccines- Shingrix (1 of 2) 06/09/2022 (Originally 11/29/2020)   INFLUENZA VACCINE  09/10/2022 (Originally 01/10/2022)   MAMMOGRAM  06/02/2022   COLONOSCOPY (Pts 45-24yrs Insurance coverage will need to be confirmed)  11/03/2027    TETANUS/TDAP  01/30/2028   HIV Screening  Completed   HPV VACCINES  Aged Out   PAP SMEAR-Modifier  Discontinued    Chart Review Today: I personally reviewed active problem list, medication list, allergies, family history, social history, health maintenance, notes from last encounter, lab results, imaging with the patient/caregiver today.   Review of Systems  Constitutional: Negative.   HENT: Negative.    Eyes: Negative.   Respiratory: Negative.    Cardiovascular: Negative.   Gastrointestinal: Negative.   Endocrine: Negative.   Genitourinary: Negative.   Musculoskeletal: Negative.   Skin: Negative.   Allergic/Immunologic: Negative.   Neurological: Negative.   Hematological: Negative.   Psychiatric/Behavioral: Negative.    All other systems reviewed and are negative.    Objective:   Vitals:   03/10/22 1334  BP: (!) 148/74  Pulse: (!) 101  Resp: 16  Temp: 97.8 F (36.6 C)  TempSrc: Oral  SpO2: 99%  Weight: 217 lb 3.2 oz (98.5 kg)  Height: 5\' 6"  (1.676 m)    Body mass index is 35.06 kg/m.  Physical Exam Vitals and nursing note reviewed.  Constitutional:      Appearance: She is well-developed.  HENT:     Head: Normocephalic and atraumatic.     Right Ear: Tympanic membrane, ear canal and external ear normal. There is no impacted cerumen.     Left Ear: Tympanic membrane, ear canal and external ear normal. There is no impacted cerumen.     Nose: Mucosal edema, congestion and rhinorrhea present.     Right Turbinates: Enlarged and swollen.     Left Turbinates: Enlarged and swollen.     Right Sinus: Maxillary sinus tenderness and frontal sinus tenderness present.     Left Sinus: Maxillary sinus tenderness and frontal sinus tenderness present.     Mouth/Throat:     Pharynx: Oropharynx is clear. No oropharyngeal exudate or posterior oropharyngeal erythema.  Eyes:     General:        Right eye: No discharge.        Left eye: No discharge.     Conjunctiva/sclera:  Conjunctivae normal.  Neck:     Trachea: No tracheal deviation.  Cardiovascular:  Rate and Rhythm: Normal rate and regular rhythm.     Pulses: Normal pulses.     Heart sounds: Normal heart sounds. No murmur heard.    No friction rub. No gallop.  Pulmonary:     Effort: Pulmonary effort is normal. No respiratory distress.     Breath sounds: Normal breath sounds. No stridor. No wheezing, rhonchi or rales.  Musculoskeletal:        General: Normal range of motion.  Skin:    General: Skin is warm and dry.     Findings: No rash.  Neurological:     Mental Status: She is alert.     Motor: No abnormal muscle tone.     Coordination: Coordination normal.  Psychiatric:        Attention and Perception: Attention and perception normal.        Mood and Affect: Mood and affect normal. Mood is not anxious.        Behavior: Behavior normal. Behavior is cooperative.         Assessment & Plan:     1. Hypertension, benign BP elevated today -she also spent most of her office visit today telling me about her husband new cancer diagnosis and how she has separated and multiple other stories about how this has been difficult for her likely her blood pressure is elevated due to the stressors some anxiety and she appeared somewhat worked up today, she also has recurrent sinus infection and tenderness in her frontal and maxillary sinuses so I will treat her for an infection, she has been monitoring her blood pressure at home and has some normal blood pressure readings  We will keep her medication dose the same losartan-HCTZ 50-12.5 she will monitor her blood pressures at home Follow-up in 1 to 2 weeks I would prefer to increase her medication to 100-12.5 if blood pressure on average remains elevated - COMPLETE METABOLIC PANEL WITH GFR  2. Need for influenza vaccination Declined today  3. Need for shingles vaccine Declined  4. Need for hepatitis C screening test Previously ordered, prior labs  were not completed and were printed today  5. Antibiotic-induced yeast infection She needs Diflucan with antibiotics this will be sent in for her  6. Recurrent sinusitis More than 3 weeks of increased congestion facial pain and pressure and she is tender on exam she has this recurrently this is her second antibiotic in a short amount of time I explained that she will need to see ENT if this continues to happen and she should continue her other medications for rhinosinusitis and allergies - amoxicillin-clavulanate (AUGMENTIN) 875-125 MG tablet; Take 1 tablet by mouth 2 (two) times daily for 7 days.  Dispense: 14 tablet; Refill: 0  7. Vasomotor symptoms due to menopause Labs previously ordered by Dr. Ancil Boozer but not yet completed she is going to complete this today  8. Dysmetabolic syndrome - COMPLETE METABOLIC PANEL WITH GFR - Lipid panel  9. Acute bronchitis, unspecified organism Lungs were clear on exam but she does feel tight and congested in her chest encouraged her to continue her allergy medications, get Mucinex without dextromethorphan and she can try prednisone and albuterol - predniSONE (DELTASONE) 20 MG tablet; Take 2 tablets (40 mg total) by mouth daily with breakfast for 5 days.  Dispense: 10 tablet; Refill: 0 - albuterol (VENTOLIN HFA) 108 (90 Base) MCG/ACT inhaler; Inhale 2 puffs into the lungs every 6 (six) hours as needed for wheezing or shortness of breath.  Dispense: 8 g;  Refill: 0   Return for 1-2 month f/up for BP recheck (virtual ok if able to give BP readings).   Danelle Berry, PA-C 03/10/22 1:51 PM

## 2022-03-13 LAB — FSH/LH
FSH: 42.2 m[IU]/mL
LH: 24.4 m[IU]/mL

## 2022-03-13 LAB — COMPLETE METABOLIC PANEL WITH GFR
AG Ratio: 1.5 (calc) (ref 1.0–2.5)
ALT: 17 U/L (ref 6–29)
AST: 17 U/L (ref 10–35)
Albumin: 4.1 g/dL (ref 3.6–5.1)
Alkaline phosphatase (APISO): 50 U/L (ref 37–153)
BUN: 13 mg/dL (ref 7–25)
CO2: 30 mmol/L (ref 20–32)
Calcium: 9.5 mg/dL (ref 8.6–10.4)
Chloride: 102 mmol/L (ref 98–110)
Creat: 0.97 mg/dL (ref 0.50–1.03)
Globulin: 2.8 g/dL (calc) (ref 1.9–3.7)
Glucose, Bld: 72 mg/dL (ref 65–139)
Potassium: 3.2 mmol/L — ABNORMAL LOW (ref 3.5–5.3)
Sodium: 140 mmol/L (ref 135–146)
Total Bilirubin: 0.9 mg/dL (ref 0.2–1.2)
Total Protein: 6.9 g/dL (ref 6.1–8.1)
eGFR: 71 mL/min/{1.73_m2} (ref >=60)

## 2022-03-13 LAB — HEPATITIS C ANTIBODY: Hepatitis C Ab: NONREACTIVE

## 2022-03-13 LAB — LIPID PANEL
Cholesterol: 228 mg/dL — ABNORMAL HIGH (ref <200)
HDL: 51 mg/dL (ref >=50)
LDL Cholesterol (Calc): 152 mg/dL (calc) — ABNORMAL HIGH
Non-HDL Cholesterol (Calc): 177 mg/dL (calc) — ABNORMAL HIGH (ref <130)
Total CHOL/HDL Ratio: 4.5 (calc) (ref <5.0)
Triglycerides: 126 mg/dL (ref <150)

## 2022-03-13 LAB — TSH: TSH: 0.72 mIU/L

## 2022-03-13 LAB — VITAMIN B12: Vitamin B-12: 519 pg/mL (ref 200–1100)

## 2022-04-07 ENCOUNTER — Ambulatory Visit: Payer: Commercial Managed Care - HMO | Admitting: Family Medicine

## 2022-04-14 ENCOUNTER — Encounter: Payer: Self-pay | Admitting: Family Medicine

## 2022-04-14 ENCOUNTER — Ambulatory Visit (INDEPENDENT_AMBULATORY_CARE_PROVIDER_SITE_OTHER): Payer: Commercial Managed Care - HMO | Admitting: Family Medicine

## 2022-04-14 VITALS — BP 132/74 | HR 82 | Temp 97.8°F | Resp 16 | Ht 66.0 in | Wt 216.7 lb

## 2022-04-14 DIAGNOSIS — J302 Other seasonal allergic rhinitis: Secondary | ICD-10-CM

## 2022-04-14 DIAGNOSIS — Z23 Encounter for immunization: Secondary | ICD-10-CM

## 2022-04-14 DIAGNOSIS — E559 Vitamin D deficiency, unspecified: Secondary | ICD-10-CM

## 2022-04-14 DIAGNOSIS — N951 Menopausal and female climacteric states: Secondary | ICD-10-CM

## 2022-04-14 DIAGNOSIS — Z91018 Allergy to other foods: Secondary | ICD-10-CM

## 2022-04-14 DIAGNOSIS — I10 Essential (primary) hypertension: Secondary | ICD-10-CM

## 2022-04-14 DIAGNOSIS — J329 Chronic sinusitis, unspecified: Secondary | ICD-10-CM

## 2022-04-14 DIAGNOSIS — Z872 Personal history of diseases of the skin and subcutaneous tissue: Secondary | ICD-10-CM

## 2022-04-14 MED ORDER — FLUTICASONE PROPIONATE 50 MCG/ACT NA SUSP: 2.0000 | Freq: Every day | NASAL | 6 refills | Status: AC

## 2022-04-14 MED ORDER — LOSARTAN POTASSIUM-HCTZ 50-12.5 MG PO TABS: 1.0000 | ORAL_TABLET | Freq: Every day | ORAL | 3 refills | Status: AC

## 2022-04-14 MED ORDER — MONTELUKAST SODIUM 10 MG PO TABS: 10.0000 mg | ORAL_TABLET | Freq: Every day | ORAL | 3 refills | Status: AC

## 2022-04-14 MED ORDER — FEXOFENADINE HCL 180 MG PO TABS: 180.0000 mg | ORAL_TABLET | Freq: Every day | ORAL | 2 refills | Status: AC

## 2022-04-14 MED ORDER — EPINEPHRINE 0.3 MG/0.3ML IJ SOAJ: 0.3000 mg | INTRAMUSCULAR | 0 refills | Status: AC | PRN

## 2022-04-14 NOTE — Assessment & Plan Note (Signed)
Recent abx and steroids, recurrent, with chronic nasal sx congestion and postnasal drip Encouraged her to consider switching up intranasal steroids, can add intranasal antihistamine Double up on 2nd generation antihistamine, avoid triggers, can do cool mister, intranasal saline for hydration, she wishes for referral to allergy specialist, was previously on allergy shots Meds refilled and referral ordered per pt request

## 2022-04-14 NOTE — Assessment & Plan Note (Signed)
Reviewed her labs, sx for now are not very frequent or severe, she declines meds previously sent in - not on clonidine or effexor

## 2022-04-14 NOTE — Progress Notes (Signed)
Patient ID: Debra Stephens, female    DOB: Aug 20, 1970, 51 y.o.   MRN: [id number]  PCP: Debra Grana, PA-C  Chief Complaint  Patient presents with   Follow-up   Hypertension    Pt states has not been checking BP at home due to taking Prednisone and OTC meds that Debra Stephens believes it would trigger her BP.    Subjective:   Debra Stephens is a 51 y.o. female, presents to clinic with CC of the following:  HPI   Prior appt Sept 29, 2023, Hypertension: Blood pressure is elevated today and has been running high sometimes at home but Debra Stephens also has readings in the 120s over 60s Currently managed on losartan-HCTZ 50-12.5, BP at home running 140-150/s SPB Pt reports good med compliance and denies any SE.   Blood pressure today is elevated    BP Readings from Last 3 Encounters:  03/10/22 (!) 148/74  12/09/21 140/68  08/24/21 122/68    Pt denies CP, SOB, exertional sx, LE edema, palpitation, Ha's, visual disturbances, lightheadedness, hypotension, syncope.  1. Hypertension, benign BP elevated today -Debra Stephens also spent most of her office visit today telling me about her husband new cancer diagnosis and how Debra Stephens has separated and multiple other stories about how this has been difficult for her likely her blood pressure is elevated due to the stressors some anxiety and Debra Stephens appeared somewhat worked up today, Debra Stephens also has recurrent sinus infection and tenderness in her frontal and maxillary sinuses so I will treat her for an infection, Debra Stephens has been monitoring her blood pressure at home and has some normal blood pressure readings   We will keep her medication dose the same losartan-HCTZ 50-12.5 Debra Stephens will monitor her blood pressures at home Follow-up in 1 to 2 weeks I would prefer to increase her medication to 100-12.5 if blood pressure on average remains elevated - COMPLETE METABOLIC PANEL WITH GFR  Kept BP the same and Debra Stephens monitored at home, readings at home still 120-129/60-70, Debra Stephens feels good, is  taking losartan-HCTZ 50-12.5, no SE or concerns BP improved here compared to last couple OV: BP Readings from Last 3 Encounters:  04/14/22 132/74  03/10/22 (!) 142/76  12/09/21 140/68   Also complains of acute on chronic nasal sx, stuffiness, post nasal drainage and seasonal allergies which are still worse and Debra Stephens wants referral, previously has seen ENT/done allergy testing and allergy shots Debra Stephens is currently on singulair, flonase and allegra    Patient Active Problem List   Diagnosis Date Noted   Seasonal allergies 12/09/2021   Class 2 severe obesity due to excess calories with serious comorbidity and body mass index (BMI) of 35.0 to 35.9 in adult ([place]) 08/24/2021   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   Hypertension, benign 04/22/2020   Vitamin B12 deficiency 01/29/2018   Recurrent sinusitis 03/54/6568   Dysmetabolic syndrome 12/75/1700   Chronic idiopathic constipation 08/13/2017   Obesity (BMI 30-39.9) 07/27/2017   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   Vitamin D deficiency 11/22/2016   ANA positive 05/24/2016   H/O urticaria 09/08/2015   Angio-edema 09/08/2015      Current Outpatient Medications:    acetaminophen (TYLENOL) 325 MG tablet, Take 650 mg by mouth as needed., Disp: , Rfl:    albuterol (VENTOLIN HFA) 108 (90 Base) MCG/ACT inhaler, Inhale 2 puffs into the lungs every 6 (six) hours as needed for wheezing or shortness of breath., Disp: 8 g, Rfl: 0   cloNIDine (CATAPRES) 0.1 MG tablet, Take  1 tablet (0.1 mg total) by mouth 3 (three) times daily., Disp: 30 tablet, Rfl: 0   EPINEPHrine (EPIPEN 2-PAK) 0.3 mg/0.3 mL IJ SOAJ injection, Inject 0.3 mg into the muscle as needed for anaphylaxis., Disp: 2 each, Rfl: 0   fexofenadine (ALLEGRA) 180 MG tablet, Take 180 mg by mouth daily., Disp: , Rfl:    fluconazole (DIFLUCAN) 150 MG tablet, Take 1 tablet (150 mg total) by mouth every 3 (three) days as needed (for vaginal itching/yeast infection sx)., Disp: 2 tablet, Rfl: 0    fluticasone (FLONASE) 50 MCG/ACT nasal spray, Place 2 sprays into both nostrils daily., Disp: 16 g, Rfl: 6   losartan-hydrochlorothiazide (HYZAAR) 50-12.5 MG tablet, Take 1 tablet by mouth daily., Disp: 90 tablet, Rfl: 3   montelukast (SINGULAIR) 10 MG tablet, Take 1 tablet (10 mg total) by mouth at bedtime., Disp: 30 tablet, Rfl: 3   Multiple Vitamin (MULTIVITAMIN WITH MINERALS) TABS tablet, Take 1 tablet by mouth daily., Disp: , Rfl:    olopatadine (PATANOL) 0.1 % ophthalmic solution, Place 1 drop into both eyes 2 (two) times daily., Disp: 5 mL, Rfl: 2   venlafaxine XR (EFFEXOR XR) 37.5 MG 24 hr capsule, Take 1 capsule (37.5 mg total) by mouth daily with breakfast., Disp: 30 capsule, Rfl: 0   Vitamin D, Ergocalciferol, (DRISDOL) 1.25 MG (50000 UNIT) CAPS capsule, Take 1 capsule (50,000 Units total) by mouth every 7 (seven) days., Disp: 4 capsule, Rfl: 2   Allergies  Allergen Reactions   Erythromycin Other (See Comments)   Ibuprofen Hives   Levaquin [Levofloxacin] Other (See Comments)    Nausea and metallic taste mouth   Vibramycin [Doxycycline Calcium] Hives     Social History   Tobacco Use   Smoking status: Never   Smokeless tobacco: Never  Vaping Use   Vaping Use: Never used  Substance Use Topics   Alcohol use: No   Drug use: No      Chart Review Today: I personally reviewed active problem list, medication list, allergies, family history, social history, health maintenance, notes from last encounter, lab results, imaging with the patient/caregiver today.   Review of Systems  Constitutional: Negative.   HENT: Negative.    Eyes: Negative.   Respiratory: Negative.    Cardiovascular: Negative.   Gastrointestinal: Negative.   Endocrine: Negative.   Genitourinary: Negative.   Musculoskeletal: Negative.   Skin: Negative.   Allergic/Immunologic: Negative.   Neurological: Negative.   Hematological: Negative.   Psychiatric/Behavioral: Negative.    All other systems  reviewed and are negative.      Objective:   Vitals:   04/14/22 1120  BP: 132/74  Pulse: 82  Resp: 16  Temp: 97.8 F (36.6 C)  TempSrc: Oral  SpO2: 99%  Weight: 216 lb 11.2 oz (98.3 kg)  Height: _0  (1.676 m)    Body mass index is 34.98 kg/m.  Physical Exam Vitals and nursing note reviewed.  Constitutional:      General: Debra Stephens is not in acute distress.    Appearance: Normal appearance. Debra Stephens is well-developed. Debra Stephens is not ill-appearing, toxic-appearing or diaphoretic.  HENT:     Head: Normocephalic and atraumatic.     Right Ear: External ear normal.     Left Ear: External ear normal.     Nose: Nose normal.  Eyes:     General: No scleral icterus.       Right eye: No discharge.        Left eye: No discharge.  Conjunctiva/sclera: Conjunctivae normal.  Neck:     Trachea: No tracheal deviation.  Cardiovascular:     Rate and Rhythm: Normal rate and regular rhythm.     Pulses: Normal pulses.     Heart sounds: Normal heart sounds. No murmur heard.    No friction rub. No gallop.  Pulmonary:     Effort: Pulmonary effort is normal. No respiratory distress.     Breath sounds: Normal breath sounds. No stridor.  Musculoskeletal:     Right lower leg: No edema.     Left lower leg: No edema.  Skin:    General: Skin is warm and dry.     Findings: No rash.  Neurological:     Mental Status: Debra Stephens is alert. Mental status is at baseline.     Motor: No abnormal muscle tone.     Coordination: Coordination normal.  Psychiatric:        Mood and Affect: Mood normal.        Behavior: Behavior normal.      Results for orders placed or performed in visit on 03/10/22  COMPLETE METABOLIC PANEL WITH GFR  Result Value Ref Range   Glucose, Bld 72 65 - 139 mg/dL   BUN 13 7 - 25 mg/dL   Creat 0.97 0.50 - 1.03 mg/dL   eGFR 71 > OR = 60 mL/min/1.42m   BUN/Creatinine Ratio SEE NOTE: 6 - 22 (calc)   Sodium 140 135 - 146 mmol/L   Potassium 3.2 (L) 3.5 - 5.3 mmol/L   Chloride 102 98 - 110  mmol/L   CO2 30 20 - 32 mmol/L   Calcium 9.5 8.6 - 10.4 mg/dL   Total Protein 6.9 6.1 - 8.1 g/dL   Albumin 4.1 3.6 - 5.1 g/dL   Globulin 2.8 1.9 - 3.7 g/dL (calc)   AG Ratio 1.5 1.0 - 2.5 (calc)   Total Bilirubin 0.9 0.2 - 1.2 mg/dL   Alkaline phosphatase (APISO) 50 37 - 153 U/L   AST 17 10 - 35 U/L   ALT 17 6 - 29 U/L  Lipid panel  Result Value Ref Range   Cholesterol 228 (H) <200 mg/dL   HDL 51 > OR = 50 mg/dL   Triglycerides 126 <150 mg/dL   LDL Cholesterol (Calc) 152 (H) mg/dL (calc)   Total CHOL/HDL Ratio 4.5 <5.0 (calc)   Non-HDL Cholesterol (Calc) 177 (H) <130 mg/dL (calc)  TSH  Result Value Ref Range   TSH 0.72 mIU/L  Vitamin B12  Result Value Ref Range   Vitamin B-12 519 200 - 1,100 pg/mL  Hepatitis C antibody  Result Value Ref Range   Hepatitis C Ab NON-REACTIVE NON-REACTIVE  FSH/LH  Result Value Ref Range   FSH 42.2 mIU/mL   LH 24.4 mIU/mL       Assessment & Plan:   Problem List Items Addressed This Visit       Cardiovascular and Mediastinum   Hypertension, benign - Primary    Bp closer to goal today BP Readings from Last 3 Encounters:  04/14/22 132/74  03/10/22 (!) 142/76  12/09/21 140/68  Debra Stephens stayed on losartan-HCTZ 50-12.5 and monitored BP at home with most readings 120-129/60-70's with no sx or med SE Labs recently done Will continue same med with encouraged heart healthy diet and increased exercise/walking Debra Stephens does not have to continue to monitor - but recommend checking if having any sx or high or low BP      Relevant Medications   losartan-hydrochlorothiazide (HYZAAR) 50-12.5 MG  tablet      Menopausal syndrome (hot flashes)    Reviewed her labs, sx for now are not very frequent or severe, Debra Stephens declines meds previously sent in - not on clonidine or effexor      D/c clonidine and effexor on chart med list - never filled never taken        Respiratory   Recurrent sinusitis    Recent abx and steroids, recurrent, with chronic nasal sx  congestion and postnasal drip Encouraged her to consider switching up intranasal steroids, can add intranasal antihistamine Double up on 2nd generation antihistamine, avoid triggers, can do cool mister, intranasal saline for hydration, Debra Stephens wishes for referral to allergy specialist, was previously on allergy shots Meds refilled and referral ordered per pt request      Relevant Medications   fexofenadine (ALLEGRA) 180 MG tablet   fluticasone (FLONASE) 50 MCG/ACT nasal spray   montelukast (SINGULAIR) 10 MG tablet   Other Relevant Orders   Ambulatory referral to Allergy     Other   Vitamin D deficiency    Last Vit D reviewed and in normal range Last vitamin D Lab Results  Component Value Date   VD25OH 46.0 04/06/2021        H/O urticaria   Relevant Medications   fexofenadine (ALLEGRA) 180 MG tablet   montelukast (SINGULAIR) 10 MG tablet Add pepcid as needed   Other Relevant Orders   Ambulatory referral to Allergy   Seasonal allergies    See above - (sinusitis) plan and meds the same      Relevant Medications   fexofenadine (ALLEGRA) 180 MG tablet   fluticasone (FLONASE) 50 MCG/ACT nasal spray   montelukast (SINGULAIR) 10 MG tablet   Other Relevant Orders   Ambulatory referral to Allergy   Other Visit Diagnoses     Need for influenza vaccination       refused   Need for shingles vaccine       refused   Menopausal state       reviewed her labs, s/p hysterectomy, LH and FSH in postmenopausal range, mild sx with some sweats, declined the meds previously discussed and rx'd   Allergy to food       Relevant Medications   EPINEPHrine (EPIPEN 2-PAK) 0.3 mg/0.3 mL IJ SOAJ injection - new refill, prior epipen pack would be expired        Return in about 6 months (around 10/13/2022) for Routine follow-up.     Debra Grana, PA-C 04/14/22 11:40 AM

## 2022-04-14 NOTE — Assessment & Plan Note (Signed)
See above

## 2022-04-14 NOTE — Assessment & Plan Note (Signed)
Bp closer to goal today BP Readings from Last 3 Encounters:  04/14/22 132/74  03/10/22 (!) 142/76  12/09/21 140/68  She stayed on losartan-HCTZ 50-12.5 and monitored BP at home with most readings 120-129/60-70's with no sx or med SE Labs recently done Will continue same med with encouraged heart healthy diet and increased exercise/walking She does not have to continue to monitor - but recommend checking if having any sx or high or low BP

## 2022-04-14 NOTE — Assessment & Plan Note (Signed)
Last Vit D reviewed and in normal range

## 2022-04-28 ENCOUNTER — Other Ambulatory Visit: Payer: Self-pay

## 2022-08-12 ENCOUNTER — Ambulatory Visit
Admission: EM | Admit: 2022-08-12 | Discharge: 2022-08-12 | Disposition: A | Discharge status: Home / Self Care | Payer: Self-pay

## 2022-08-12 ENCOUNTER — Encounter: Payer: Self-pay | Admitting: Emergency Medicine

## 2022-08-12 ENCOUNTER — Telehealth: Payer: Self-pay | Admitting: Nurse Practitioner

## 2022-08-12 DIAGNOSIS — J0100 Acute maxillary sinusitis, unspecified: Secondary | ICD-10-CM

## 2022-08-12 DIAGNOSIS — B3731 Acute candidiasis of vulva and vagina: Secondary | ICD-10-CM

## 2022-08-12 MED ORDER — AMOXICILLIN-POT CLAVULANATE 875-125 MG PO TABS: 1.0000 | ORAL_TABLET | Freq: Two times a day (BID) | ORAL | 0 refills | Status: AC

## 2022-08-12 MED ORDER — FLUCONAZOLE 150 MG PO TABS: 150.0000 mg | ORAL_TABLET | Freq: Once | ORAL | 0 refills | Status: AC

## 2022-08-12 MED ORDER — PREDNISONE 50 MG PO TABS: 50.0000 mg | ORAL_TABLET | Freq: Every day | ORAL | 0 refills | Status: AC

## 2022-08-12 NOTE — ED Provider Notes (Signed)
[facility]    CSN: [id number] Arrival date & time: 08/12/22  [id number]      History   Chief Complaint No chief complaint on file.   HPI Debra Stephens is a 52 y.o. female.   HPI  Presents to urgent care with symptoms x 4 days including cough productive of colored sputum, sinus congestion/pain with copious drainage, left-sided ear pain and bilateral fullness.  She endorses production of thick yellow drainage. Reports frontal/maxillary sinus tenderness.  Hx recurrent sinusitis.  Past Medical History:  Diagnosis Date   Abdominal tenderness, right upper quadrant    Abnormal Pap smear of cervix    Allergic rhinitis, cause unspecified    Anemia    Atypical chest pain 06/24/2013   Chronic headaches    Cough    Family history of breast cancer    Family history of pancreatic cancer    9/21 cancer genetic testing letter sent   Lumbago    Other and unspecified hyperlipidemia    Swelling    Thoracic or lumbosacral neuritis or radiculitis, unspecified    Unspecified symptom associated with female genital organs    Unspecified vitamin D deficiency     Patient Active Problem List   Diagnosis Date Noted   Menopausal syndrome (hot flashes) 04/14/2022   Seasonal allergies 12/09/2021   Class 2 severe obesity due to excess calories with serious comorbidity and body mass index (BMI) of 35.0 to 35.9 in adult ([place]) 08/24/2021   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   Hypertension, benign 04/22/2020   Vitamin B12 deficiency 01/29/2018   Recurrent sinusitis [id number]   Dysmetabolic syndrome [id number]   Chronic idiopathic constipation 08/13/2017   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      Home Medications    Prior to Admission medications   Medication Sig Start Date End Date Taking? Authorizing Provider  acetaminophen (TYLENOL) 325 MG tablet Take 650 mg by mouth as needed.    [provider]  albuterol (VENTOLIN HFA) 108 (90 Base) MCG/ACT inhaler Inhale 2 puffs into the lungs every 6 (six) hours as needed for wheezing or shortness of breath. 03/10/22   Delsa Grana, PA-C  EPINEPHrine (EPIPEN 2-PAK) 0.3 mg/0.3 mL IJ SOAJ injection Inject 0.3 mg into the muscle as needed for anaphylaxis. 04/14/22   Delsa Grana, PA-C  fexofenadine (ALLEGRA) 180 MG tablet Take 1 tablet (180 mg total) by mouth daily. 04/14/22   Delsa Grana, PA-C  fluticasone (FLONASE) 50 MCG/ACT nasal spray Place 2 sprays into both nostrils daily. 04/14/22   Delsa Grana, PA-C  losartan-hydrochlorothiazide (HYZAAR) 50-12.5 MG tablet Take 1 tablet by mouth daily. 04/14/22   Delsa Grana, PA-C  montelukast (SINGULAIR) 10 MG tablet Take 1 tablet (10 mg total) by mouth at bedtime. 04/14/22   Delsa Grana, PA-C  Multiple Vitamin (MULTIVITAMIN WITH MINERALS) TABS tablet Take 1 tablet by mouth daily.    [provider]  olopatadine (PATANOL) 0.1 % ophthalmic solution Place  1 drop into both eyes 2 (two) times daily. 09/24/20   Steele Sizer, MD  Vitamin D, Ergocalciferol, (DRISDOL) 1.25 MG (50000 UNIT) CAPS capsule Take 1 capsule (50,000 Units total) by mouth every 7 (seven) days. 03/10/20   Gae Dry, MD    Family History Family History  Problem Relation Age of Onset   Stroke Mother    Hypertension Mother    Heart murmur Mother    Hypertension Father    Breast cancer Maternal Aunt    Ovarian cancer Maternal Aunt    Lupus Sister    Lupus Daughter    Liver cancer Maternal Grandfather     Cirrhosis Maternal Grandfather    Pancreatic cancer Maternal Grandfather    Hypertension Paternal Grandmother    Sickle cell anemia Sister    Thyroid cancer Sister    Seizures Sister    Hashimoto's thyroiditis Sister    Fibromyalgia Sister     Social History Social History   Tobacco Use   Smoking status: Never   Smokeless tobacco: Never  Vaping Use   Vaping Use: Never used  Substance Use Topics   Alcohol use: No   Drug use: No     Allergies   Erythromycin, Ibuprofen, Levaquin [levofloxacin], and Vibramycin [doxycycline calcium]   Review of Systems Review of Systems   Physical Exam Triage Vital Signs ED Triage Vitals [08/12/22 0906]  Enc Vitals Group     BP 128/81     Pulse Rate (!) 103     Resp 16     Temp 98.7 F (37.1 C)     Temp Source Oral     SpO2 95 %     Weight      Height      Head Circumference      Peak Flow      Pain Score      Pain Loc      Pain Edu?      Excl. in [place]?    No data found.  Updated Vital Signs BP 128/81 (BP Location: Left Arm)   Pulse (!) 103   Temp 98.7 F (37.1 C) (Oral)   Resp 16   SpO2 95%   Visual Acuity Right Eye Distance:   Left Eye Distance:   Bilateral Distance:    Right Eye Near:   Left Eye Near:    Bilateral Near:     Physical Exam Vitals reviewed.  Constitutional:      Appearance: Normal appearance. She is ill-appearing.  HENT:     Right Ear: A middle ear effusion is present. Tympanic membrane is not erythematous.     Left Ear: A middle ear effusion is present. Tympanic membrane is bulging. Tympanic membrane is not erythematous.     Mouth/Throat:     Mouth: Mucous membranes are moist.     Pharynx: No oropharyngeal exudate or posterior oropharyngeal erythema.  Cardiovascular:     Rate and Rhythm: Normal rate and regular rhythm.     Pulses: Normal pulses.     Heart sounds: Normal heart sounds.  Pulmonary:     Effort: Pulmonary effort is normal.     Breath sounds: Normal breath sounds.   Skin:    General: Skin is warm and dry.  Neurological:     General: No focal deficit present.     Mental Status: She is alert and oriented to person, place, and time.  Psychiatric:        Mood and Affect: Mood normal.  Behavior: Behavior normal.      UC Treatments / Results  Labs (all labs ordered are listed, but only abnormal results are displayed) Labs Reviewed - No data to display  EKG   Radiology No results found.  Procedures Procedures (including critical care time)  Medications Ordered in UC Medications - No data to display  Initial Impression / Assessment and Plan / UC Course  I have reviewed the triage vital signs and the nursing notes.  Pertinent labs & imaging results that were available during my care of the patient were reviewed by me and considered in my medical decision making (see chart for details).   Patient is afebrile here without recent antipyretics. Satting well on room air. Overall is ill appearing, well hydrated, without respiratory distress. Pulmonary exam is unremarkable.  Lungs CTAB without wheezing, rhonchi, rales.  TMs are nonerythematous.  Middle ear effusion bilaterally.  Left TM bulge.  Maxillary sinus tenderness is present with palpation.  Symptoms are consistent with an acute viral process resulting in sinusitis and eustachian tube dysfunction.  After shared decision-making with patient, given she has tolerated prednisone before, agreed to treat with anti-inflammatory to close steroid to reduce sinus inflammation and speed resolution of her symptoms.  We discussed antibiotic stewardship and, while I am prescribing Augmentin, this is prescribed as a backup treatment which she will only use should she continue to have symptoms toward the middle of next week (additional 5 days).   Final Clinical Impressions(s) / UC Diagnoses   Final diagnoses:  None   Discharge Instructions   None    ED Prescriptions   None    PDMP not reviewed  this encounter.   Rose Phi, [place] 08/12/22 [phone]

## 2022-08-12 NOTE — ED Triage Notes (Signed)
Symptoms started Tuesday Reports nasal drainage with thick yellow drainage, post nasal drip, tongue soreness, productive cough, chills, frontal and maxillary sinus tenderness, otalgia and ear fullness.  Taking coricidin without improvement

## 2022-08-12 NOTE — Progress Notes (Signed)
No show- patient went and had a face ti face visit and forgot to cancel this visit.

## 2022-08-12 NOTE — Discharge Instructions (Signed)
Follow up with evaluation at ENT.

## 2022-08-13 ENCOUNTER — Ambulatory Visit: Payer: Self-pay

## 2022-10-13 ENCOUNTER — Ambulatory Visit: Payer: Commercial Managed Care - HMO | Admitting: Family Medicine

## 2023-03-12 ENCOUNTER — Other Ambulatory Visit: Payer: Self-pay | Admitting: Family Medicine

## 2023-03-12 DIAGNOSIS — Z1231 Encounter for screening mammogram for malignant neoplasm of breast: Secondary | ICD-10-CM

## 2023-03-14 ENCOUNTER — Emergency Department: Payer: Managed Care, Other (non HMO)

## 2023-03-14 ENCOUNTER — Encounter: Payer: Self-pay | Admitting: Emergency Medicine

## 2023-03-14 ENCOUNTER — Other Ambulatory Visit: Payer: Self-pay

## 2023-03-14 ENCOUNTER — Emergency Department
Admission: EM | Admit: 2023-03-14 | Discharge: 2023-03-14 | Disposition: A | Discharge status: Home / Self Care | Payer: Managed Care, Other (non HMO) | Attending: Emergency Medicine | Admitting: Emergency Medicine

## 2023-03-14 DIAGNOSIS — R519 Headache, unspecified: Secondary | ICD-10-CM

## 2023-03-14 DIAGNOSIS — J0101 Acute recurrent maxillary sinusitis: Secondary | ICD-10-CM | POA: Insufficient documentation

## 2023-03-14 LAB — CBC
HCT: 40.7 % (ref 36.0–46.0)
Hemoglobin: 12.7 g/dL (ref 12.0–15.0)
MCH: 23.1 pg — ABNORMAL LOW (ref 26.0–34.0)
MCHC: 31.2 g/dL (ref 30.0–36.0)
MCV: 74 fL — ABNORMAL LOW (ref 80.0–100.0)
Platelets: 199 10*3/uL (ref 150–400)
RBC: 5.5 MIL/uL — ABNORMAL HIGH (ref 3.87–5.11)
RDW: 15.9 % — ABNORMAL HIGH (ref 11.5–15.5)
WBC: 4.7 10*3/uL (ref 4.0–10.5)
nRBC: 0 % (ref 0.0–0.2)

## 2023-03-14 LAB — BASIC METABOLIC PANEL
Anion gap: 10 (ref 5–15)
BUN: 13 mg/dL (ref 6–20)
CO2: 27 mmol/L (ref 22–32)
Calcium: 9.2 mg/dL (ref 8.9–10.3)
Chloride: 101 mmol/L (ref 98–111)
Creatinine, Ser: 0.99 mg/dL (ref 0.44–1.00)
GFR, Estimated: >60 mL/min (ref >60)
Glucose, Bld: 93 mg/dL (ref 70–99)
Potassium: 3.4 mmol/L — ABNORMAL LOW (ref 3.5–5.1)
Sodium: 138 mmol/L (ref 135–145)

## 2023-03-14 LAB — HEPATIC FUNCTION PANEL
ALT: 18 U/L (ref 0–44)
AST: 24 U/L (ref 15–41)
Albumin: 4.2 g/dL (ref 3.5–5.0)
Alkaline Phosphatase: 60 U/L (ref 38–126)
Bilirubin, Direct: 0.1 mg/dL (ref 0.0–0.2)
Indirect Bilirubin: 1.3 mg/dL — ABNORMAL HIGH (ref 0.3–0.9)
Total Bilirubin: 1.4 mg/dL — ABNORMAL HIGH (ref 0.3–1.2)
Total Protein: 7.6 g/dL (ref 6.5–8.1)

## 2023-03-14 LAB — TROPONIN I (HIGH SENSITIVITY)
Troponin I (High Sensitivity): <2 ng/L (ref <18)
Troponin I (High Sensitivity): <2 ng/L (ref <18)

## 2023-03-14 MED ORDER — SODIUM CHLORIDE 0.9 % IV BOLUS
500.0000 mL | Freq: Once | INTRAVENOUS | Status: AC
  Administered 2023-03-14: 500 mL via INTRAVENOUS

## 2023-03-14 MED ORDER — IOHEXOL 350 MG/ML SOLN
75.0000 mL | Freq: Once | INTRAVENOUS | Status: AC | PRN
  Administered 2023-03-14: 75 mL via INTRAVENOUS

## 2023-03-14 MED ORDER — DIPHENHYDRAMINE HCL 50 MG/ML IJ SOLN
25.0000 mg | Freq: Once | INTRAMUSCULAR | Status: AC
  Administered 2023-03-14: 25 mg via INTRAVENOUS
  Filled 2023-03-14: qty 1

## 2023-03-14 MED ORDER — DEXAMETHASONE SODIUM PHOSPHATE 10 MG/ML IJ SOLN
10.0000 mg | Freq: Once | INTRAMUSCULAR | Status: AC
  Administered 2023-03-14: 10 mg via INTRAVENOUS
  Filled 2023-03-14: qty 1

## 2023-03-14 MED ORDER — ONDANSETRON 4 MG PO TBDP: 4.0000 mg | ORAL_TABLET | Freq: Three times a day (TID) | ORAL | 0 refills | Status: AC | PRN

## 2023-03-14 MED ORDER — METOCLOPRAMIDE HCL 5 MG/ML IJ SOLN
10.0000 mg | Freq: Once | INTRAMUSCULAR | Status: AC
  Administered 2023-03-14: 10 mg via INTRAVENOUS
  Filled 2023-03-14: qty 2

## 2023-03-14 MED ORDER — PREDNISONE 20 MG PO TABS: 40.0000 mg | ORAL_TABLET | Freq: Every day | ORAL | 0 refills | Status: AC

## 2023-03-14 MED ORDER — HYDROCODONE-ACETAMINOPHEN 5-325 MG PO TABS: 1.0000 | ORAL_TABLET | Freq: Four times a day (QID) | ORAL | 0 refills | Status: AC | PRN

## 2023-03-14 NOTE — ED Triage Notes (Addendum)
Pt in with frontal headache since this morning, was at work and developed dizziness and chest pain as well. She is reporting R chest tightness that radiates to R shoulder/neck. Pt states she was seen Monday and placed on Amoxicillin for sinus infection. BP 173/87 in triage, no unilateral weakness noted

## 2023-03-14 NOTE — ED Provider Notes (Signed)
[facility] Provider Note    Event Date/Time   First MD Initiated Contact with Patient 03/14/23 1755     (approximate)   History   Chest Pain and Headache   HPI  Debra Stephens is a 52 y.o. female  with PMHx headaches, HLD, cervical and thoracic DDD, here with chest pain and headache. Pt reports that her primary complaint is several days of aching, throbbing, generalized headache, fullness sensation, and sinus pressure. She was seen by her PCP and started on Augmentin. Since then, her headache has persisted. She has also had pain in her cervical neck that shoots toward her head and down to her R shoulder and upper chest. No alleviating factors. She has felt tired, occasionally dizzy/off balance. No tinnitus.      Physical Exam   Triage Vital Signs: ED Triage Vitals  Encounter Vitals Group     BP 03/14/23 1633 (!) 173/87     Systolic BP Percentile --      Diastolic BP Percentile --      Pulse Rate 03/14/23 1633 79     Resp 03/14/23 1633 20     Temp 03/14/23 1633 98.2 F (36.8 C)     Temp src --      SpO2 03/14/23 1633 98 %     Weight 03/14/23 1614 224 lb (101.6 kg)     Height 03/14/23 1614 5\' 6"  (1.676 m)     Head Circumference --      Peak Flow --      Pain Score 03/14/23 1633 7     Pain Loc --      Pain Education --      Exclude from Growth Chart --     Most recent vital signs: Vitals:   03/14/23 2116 03/14/23 2154  BP:    Pulse:    Resp:    Temp: 98.2 F (36.8 C) 97.9 F (36.6 C)  SpO2:       General: Awake, no distress. CV:  Good peripheral perfusion. RRR.  Resp:  Normal work of breathing. Lungs clear bilaterally.  Abd:  No distention. No tenderness. Other:  CNII-XII intact. EOMI and PERRL. Face is symmetric. Speech is normal. Strength 5/5 bl UE and LE. Normal sensation to light touch bl UE and LE. Normal gait. Normal FTN.   ED Results / Procedures / Treatments   Labs (all labs ordered are listed, but only abnormal  results are displayed) Labs Reviewed  BASIC METABOLIC PANEL - Abnormal; Notable for the following components:      Result Value   Potassium 3.4 (*)    All other components within normal limits  CBC - Abnormal; Notable for the following components:   RBC 5.50 (*)    MCV 74.0 (*)    MCH 23.1 (*)    RDW 15.9 (*)    All other components within normal limits  HEPATIC FUNCTION PANEL - Abnormal; Notable for the following components:   Total Bilirubin 1.4 (*)    Indirect Bilirubin 1.3 (*)    All other components within normal limits  TROPONIN I (HIGH SENSITIVITY)  TROPONIN I (HIGH SENSITIVITY)     EKG Normal sinus rhythm, ventricular rate 72.  PR 152, QRS 76, QTc 429.  No ST ovation depression indications of acute ischemic infarct.   RADIOLOGY Chest x-ray: No active disease   I also independently reviewed and agree with radiologist interpretations.   PROCEDURES:  Critical Care performed: No  .1-3 Lead EKG  Interpretation  Performed by: Shaune Pollack, MD Authorized by: Shaune Pollack, MD     Interpretation: normal     ECG rate:  70-90   ECG rate assessment: normal     Rhythm: sinus rhythm     Ectopy: none     Conduction: normal   Comments:     Indication: Headache, chest pain     MEDICATIONS ORDERED IN ED: Medications  metoCLOPramide (REGLAN) injection 10 mg (10 mg Intravenous Given 03/14/23 1900)  diphenhydrAMINE (BENADRYL) injection 25 mg (25 mg Intravenous Given 03/14/23 1859)  sodium chloride 0.9 % bolus 500 mL (0 mLs Intravenous Stopped 03/14/23 2043)  iohexol (OMNIPAQUE) 350 MG/ML injection 75 mL (75 mLs Intravenous Contrast Given 03/14/23 1919)  dexamethasone (DECADRON) injection 10 mg (10 mg Intravenous Given 03/14/23 2114)     IMPRESSION / MDM / ASSESSMENT AND PLAN / ED COURSE  I reviewed the triage vital signs and the nursing notes.                              Differential diagnosis includes, but is not limited to, sinus headache, HTN related HA,  cervicalgia, other primary headache syndrome, migraines, unlikely ICH, aneurysm, CVA  Patient's presentation is most consistent with acute presentation with potential threat to life or bodily function.  The patient is on the cardiac monitor to evaluate for evidence of arrhythmia and/or significant heart rate changes   52 yo F here with headache, chest discomfort. Suspect much of her sx are 2/2 significant sinusitis, with subsequent headache. She may also have cervical tension/radiculopathy contributing to her upper shoulder/upper chest discomfort and headache as well. H/o recurrent sinus issues. Given her hTN, age, and sx, however, CT Angio Head/Neck oibtained, reivewed, and is negative for major abnormality. No aneurysms, dissections. Pt has incidental note of ? Empty sella - no h/o chronic headaches, no sx of IIH but I did discuss this with her. Otherwise, she feels better in ED with sx control. Her EKG is nonischemic, trop negative, and I do not suspect ACS. CXR is clear.   Will place her on steroids to help with drainage for her sinuses, continue abx, and given additional analgesia for her headache. F/u as outpatient.   FINAL CLINICAL IMPRESSION(S) / ED DIAGNOSES   Final diagnoses:  Acute recurrent maxillary sinusitis  Bad headache     Rx / DC Orders   ED Discharge Orders          Ordered    HYDROcodone-acetaminophen (NORCO/VICODIN) 5-325 MG tablet  Every 6 hours PRN        03/14/23 2137    ondansetron (ZOFRAN-ODT) 4 MG disintegrating tablet  Every 8 hours PRN        03/14/23 2137    predniSONE (DELTASONE) 20 MG tablet  Daily        03/14/23 2137             Note:  This document was prepared using Dragon voice recognition software and may include unintentional dictation errors.   Shaune Pollack, MD 03/15/23 [phone]

## 2023-03-14 NOTE — Discharge Instructions (Addendum)
For your sinuses:  CONTINUE the antibiotic START the prednisone as prescribed CONTINUE the nasal spray  For your headache:  Take the prescribed medications for severe pain Take Zofran for nausea. This can also help with headache. Drink plenty of fluids.  Follow-up with primary in 1 week  Discuss your CT findings with your Primary - I do not suspect these are related to current headache, but mention them and arrange appropriate follow-up with PCP

## 2023-06-12 ENCOUNTER — Other Ambulatory Visit: Payer: Self-pay

## 2023-06-12 MED ORDER — CEFDINIR 300 MG PO CAPS
300.0000 mg | ORAL_CAPSULE | Freq: Two times a day (BID) | ORAL | 0 refills | Status: DC
  Filled 2023-06-12: qty 20, 10d supply, fill #0

## 2023-06-12 MED ORDER — FLUTICASONE PROPIONATE 50 MCG/ACT NA SUSP
2.0000 | Freq: Every day | NASAL | 11 refills | Status: DC
  Filled 2023-06-12: qty 16, 30d supply, fill #0

## 2023-06-12 MED ORDER — FLUCONAZOLE 150 MG PO TABS
150.0000 mg | ORAL_TABLET | Freq: Once | ORAL | 1 refills | Status: AC
  Filled 2023-06-12: qty 1, 1d supply, fill #0

## 2023-06-12 MED ORDER — HYDROCOD POLI-CHLORPHE POLI ER 10-8 MG/5ML PO SUER
5.0000 mL | Freq: Every evening | ORAL | 0 refills | Status: AC | PRN
  Filled 2023-06-12: qty 115, 23d supply, fill #0

## 2023-06-21 ENCOUNTER — Ambulatory Visit
Admission: RE | Admit: 2023-06-21 | Discharge: 2023-06-21 | Disposition: A | Discharge status: Home / Self Care | Payer: Managed Care, Other (non HMO) | Source: Ambulatory Visit | Attending: Family Medicine | Admitting: Family Medicine

## 2023-06-21 DIAGNOSIS — Z1231 Encounter for screening mammogram for malignant neoplasm of breast: Secondary | ICD-10-CM | POA: Insufficient documentation

## 2023-07-13 ENCOUNTER — Other Ambulatory Visit: Payer: Self-pay | Admitting: Internal Medicine

## 2023-07-13 DIAGNOSIS — I10 Essential (primary) hypertension: Secondary | ICD-10-CM

## 2023-07-13 DIAGNOSIS — I2089 Other forms of angina pectoris: Secondary | ICD-10-CM

## 2023-07-30 ENCOUNTER — Other Ambulatory Visit: Payer: Managed Care, Other (non HMO)

## 2023-09-04 ENCOUNTER — Other Ambulatory Visit: Payer: Self-pay

## 2023-09-04 MED ORDER — LOSARTAN POTASSIUM 50 MG PO TABS
50.0000 mg | ORAL_TABLET | Freq: Every day | ORAL | 1 refills | Status: DC
  Filled 2023-09-04: qty 90, 90d supply, fill #0
  Filled 2023-12-21: qty 90, 90d supply, fill #1

## 2023-09-04 MED ORDER — OLOPATADINE HCL 0.2 % OP SOLN
1.0000 [drp] | Freq: Every day | OPHTHALMIC | 0 refills | Status: AC
  Filled 2023-09-04: qty 2.5, 25d supply, fill #0

## 2023-09-04 MED ORDER — BECONASE AQ 42 MCG/SPRAY NA SUSP
2.0000 | Freq: Two times a day (BID) | NASAL | 2 refills | Status: DC
  Filled 2023-09-04: qty 25, 30d supply, fill #0

## 2023-09-04 MED ORDER — POLYMYXIN B-TRIMETHOPRIM 10000-0.1 UNIT/ML-% OP SOLN
1.0000 [drp] | OPHTHALMIC | 0 refills | Status: DC
  Filled 2023-09-04: qty 10, 25d supply, fill #0

## 2023-09-06 ENCOUNTER — Other Ambulatory Visit: Payer: Self-pay

## 2023-09-06 MED ORDER — NEOMYCIN-POLYMYXIN-DEXAMETH 3.5-10000-0.1 OP SUSP
1.0000 [drp] | Freq: Two times a day (BID) | OPHTHALMIC | 0 refills | Status: AC
  Filled 2023-09-06: qty 7.5, 75d supply, fill #0
  Filled 2023-09-06: qty 5, 10d supply, fill #0

## 2023-09-18 ENCOUNTER — Other Ambulatory Visit: Payer: Self-pay

## 2023-09-18 MED ORDER — ERGOCALCIFEROL 1.25 MG (50000 UT) PO CAPS
50000.0000 [IU] | ORAL_CAPSULE | ORAL | 0 refills | Status: AC
  Filled 2023-09-18: qty 12, 84d supply, fill #0

## 2023-09-25 ENCOUNTER — Other Ambulatory Visit: Payer: Self-pay

## 2023-09-25 MED ORDER — PREDNISONE 20 MG PO TABS
20.0000 mg | ORAL_TABLET | Freq: Every day | ORAL | 0 refills | Status: AC
  Filled 2023-09-25: qty 5, 5d supply, fill #0

## 2023-09-25 MED ORDER — FLUTICASONE PROPIONATE 50 MCG/ACT NA SUSP
2.0000 | Freq: Every day | NASAL | 11 refills | Status: DC
  Filled 2023-09-25: qty 16, 30d supply, fill #0

## 2023-09-25 MED ORDER — CETIRIZINE HCL 10 MG PO TABS
10.0000 mg | ORAL_TABLET | Freq: Every day | ORAL | 11 refills | Status: AC
  Filled 2023-09-25: qty 30, 30d supply, fill #0

## 2023-10-08 ENCOUNTER — Other Ambulatory Visit: Payer: Self-pay

## 2023-10-26 ENCOUNTER — Other Ambulatory Visit: Payer: Self-pay

## 2023-10-26 MED ORDER — PREDNISONE 10 MG PO TABS
ORAL_TABLET | ORAL | 0 refills | Status: DC
  Filled 2023-10-26: qty 20, 8d supply, fill #0

## 2023-10-26 MED ORDER — HYDROCODONE-ACETAMINOPHEN 5-325 MG PO TABS
1.0000 | ORAL_TABLET | Freq: Four times a day (QID) | ORAL | 0 refills | Status: AC | PRN
  Filled 2023-10-26: qty 20, 5d supply, fill #0

## 2023-12-21 ENCOUNTER — Other Ambulatory Visit: Payer: Self-pay

## 2024-02-21 ENCOUNTER — Other Ambulatory Visit: Payer: Self-pay

## 2024-02-21 MED ORDER — FLUCONAZOLE 150 MG PO TABS
150.0000 mg | ORAL_TABLET | ORAL | 1 refills | Status: AC
  Filled 2024-02-21: qty 2, 2d supply, fill #0

## 2024-02-21 MED ORDER — LOSARTAN POTASSIUM-HCTZ 50-12.5 MG PO TABS
1.0000 | ORAL_TABLET | Freq: Every day | ORAL | 5 refills | Status: AC
  Filled 2024-02-21: qty 30, 30d supply, fill #0
  Filled 2024-03-21: qty 30, 30d supply, fill #1
  Filled 2024-05-02: qty 30, 30d supply, fill #2
  Filled 2024-06-22 (×2): qty 30, 30d supply, fill #3

## 2024-02-21 MED ORDER — AMOXICILLIN-POT CLAVULANATE 875-125 MG PO TABS
1.0000 | ORAL_TABLET | Freq: Two times a day (BID) | ORAL | 0 refills | Status: AC
  Filled 2024-02-21: qty 20, 10d supply, fill #0

## 2024-02-21 MED ORDER — TRIAMCINOLONE ACETONIDE 55 MCG/ACT NA AERO
2.0000 | INHALATION_SPRAY | Freq: Every day | NASAL | 1 refills | Status: AC
  Filled 2024-02-21: qty 16.9, 30d supply, fill #0

## 2024-02-22 ENCOUNTER — Other Ambulatory Visit: Payer: Self-pay

## 2024-02-23 ENCOUNTER — Other Ambulatory Visit: Payer: Self-pay

## 2024-02-25 ENCOUNTER — Other Ambulatory Visit: Payer: Self-pay

## 2024-02-25 MED ORDER — PREDNISONE 10 MG PO TABS
ORAL_TABLET | ORAL | 0 refills | Status: DC
  Filled 2024-02-25: qty 20, 8d supply, fill #0

## 2024-03-06 ENCOUNTER — Other Ambulatory Visit: Payer: Self-pay

## 2024-03-06 MED ORDER — MONTELUKAST SODIUM 10 MG PO TABS
10.0000 mg | ORAL_TABLET | Freq: Every day | ORAL | 5 refills | Status: AC
  Filled 2024-03-06 – 2024-03-20 (×2): qty 30, 30d supply, fill #0

## 2024-03-06 MED ORDER — LEVOCETIRIZINE DIHYDROCHLORIDE 5 MG PO TABS
5.0000 mg | ORAL_TABLET | Freq: Every evening | ORAL | 5 refills | Status: AC
  Filled 2024-03-06: qty 30, 30d supply, fill #0

## 2024-03-06 MED ORDER — OLOPATADINE HCL 0.2 % OP SOLN
1.0000 [drp] | Freq: Every day | OPHTHALMIC | 5 refills | Status: AC
  Filled 2024-03-06: qty 2.5, 30d supply, fill #0

## 2024-03-06 MED ORDER — FLUTICASONE PROPIONATE 50 MCG/ACT NA SUSP
1.0000 | Freq: Every day | NASAL | 5 refills | Status: AC
  Filled 2024-03-06: qty 16, 60d supply, fill #0

## 2024-03-17 ENCOUNTER — Other Ambulatory Visit: Payer: Self-pay

## 2024-03-20 ENCOUNTER — Other Ambulatory Visit: Payer: Self-pay

## 2024-03-20 ENCOUNTER — Other Ambulatory Visit: Payer: Self-pay | Admitting: Family Medicine

## 2024-03-20 DIAGNOSIS — Z1231 Encounter for screening mammogram for malignant neoplasm of breast: Secondary | ICD-10-CM

## 2024-03-20 MED ORDER — VITAMIN D (ERGOCALCIFEROL) 1.25 MG (50000 UNIT) PO CAPS
50000.0000 [IU] | ORAL_CAPSULE | ORAL | 1 refills | Status: AC
  Filled 2024-03-20: qty 12, 84d supply, fill #0

## 2024-03-20 MED ORDER — POTASSIUM CHLORIDE ER 10 MEQ PO TBCR
EXTENDED_RELEASE_TABLET | Freq: Every day | ORAL | 1 refills | Status: AC
  Filled 2024-03-20: qty 30, 30d supply, fill #0

## 2024-03-20 MED ORDER — EPINEPHRINE 0.3 MG/0.3ML IJ SOAJ
0.3000 mg | INTRAMUSCULAR | 2 refills | Status: AC | PRN
  Filled 2024-03-20 – 2024-03-28 (×2): qty 2, 30d supply, fill #0

## 2024-03-21 ENCOUNTER — Other Ambulatory Visit: Payer: Self-pay

## 2024-03-21 NOTE — Progress Notes (Signed)
 [facility]                    PT Daily Progress Note  Patient's Name:Debra Stephens                        MRN #[id number]      Date:03/23/2024 Visit Number: 12   Progress Related to Long and Short Term Goals:                                                                                            []  ROM: []  Strength Pain -     while- [] at rest    [] During activity:  []  Functional goal:  where 0 = completely disabled, 100 equals full function.      []  Other: []  Balance:  Patient / Family Education:    []  Progressed HEP to include:  []  Reviewed previous HEP:   ___________________________________________________________________________________   Skilled Service Provided:  To: [x]  right   []  left     []  bilateral    []   core/body stability and balance  []  neck   []   shoulder   []  upper arm/  []  elbow   []  lower arm  []  wrist   []  hand   []   fingers  []  back     []  hip    []  upper leg    [x]  knee  []  lower leg   []   ankle  foot             Ther Ex Therapeutic Procedure CPT 97110 : 40 minutes         Manual Tx        E-Stim         Infrared     US  x min             Ice / Heat                Vaso-pneumaticcompress             [x]  ROM [x] joint mob [] inflam Setting: [] Circ  [] pain [] swelling  [] Flex [x] Flex [] pain [] Healing [] pain [] inflam [] edema  [] Bal [x]  ROM [] swelling [] pain [] swelling [] swelling Temp:         F  Strength       [x]   [] ASTYM [] circulation [] circulation [] inflam [] circulation [] soft tissue mob   Level      [] -1        [] -2      [] -3  Progressive     E  HEP    Exercise          [x]   [] tension/spasms/soft tissue Mob [] tissue mob       Posture        []   [] Correct malaign [] tissue mob        Settings:       Subjective:  Pt reports R knee is a little sore this week, but she has not been stretching as much.  She did start back to walking.  Treatment notes:  Therapeutic ex as per f/s. CP to conclude  10' Assessment of Treatment:  Stretching and mobility emphasis re-emphasized today.   Patient's Response to Treatment:  []  pain  []  pain    []   flexibility      [x]  endurance      [x]   strength     []   AROM      [x]   PROM       []   Spasms    []  Posture / alignment    []   Joint Mobility  []  Other:   Functional Improvement Noted:  Increased ability to  []  walk   []  stand for longer periods   []  dress with less help    []  lift     []   reach     []   bend    []  Other:  Remaining Impairment Requiring Continued Treatment:   []   flexibility   []   ROM   [x]   pain [x]  strength   [x]   weakness    []   balance   []   swelling   []   inflammation   []  Spasm  []  posture/alignment    []   Other:  Plan: [x]   Continue Plan of Care        []   Add:         []   Discharge after next visit    []   Discharge to HEP   []  Other:  Hip/Knee Treatment Log   Date   9/5 9/22 9/29 10/10  Bike(seat____)      Forbes (seat     ) L3 10' L3 10' L3 10' L3 10'  Elliptical        LP  Squats/[place]o 44# 3x10 44 3x10 44 3x10 49# 3x10  LP  Single Leg      LP  calf raises      Wall Slide/mini/BOSU      Stool Scoots      Step (up,down,lateral)      Multi-Hip TKE 45# 3x10 45 3x10 45 3x10 45# 3x10  Multi-Hip Abd /35# 3x10 35 3x10 35 3x10 /35# 3x10  Multi-Hip Ext/Add 45# 3x10 45 3x10 45 3x10 45# 3x10  Knee Extension(single/Bilat) 15# 3x10 15 3x10 15 3x10 15# 3x10  Leg Curls(single/Bilat) 25# 3x10 25 3x10 25 3x10 25# 3x10  LAQ      SLR (flex/abd/add/ext)      Quad Sets      Lunges      NMR: 3 point      NMR: T-band proprio      NMR: Wobble Board/BOSU      NMR: Agility      Heel Slide 3x10 3x10                Flexionator/Extensionator      Stretch-      PROM x10' 10'  x10'  Ultrasound/ Infrared      HP/CP with/ without  IFC   CP 10'   Vasopneumatic Level 1/2/3          PT Billing Documentation Date of Onset: 10/30/23 Visit Number: 12       Frequently Used Timed Codes Therapeutic Procedure  CPT 97110 : 40 minutes               Total Treatment Time: 40 minutes       Therapist's : Eva LELON Hatchet, PT

## 2024-03-25 ENCOUNTER — Other Ambulatory Visit: Payer: Self-pay

## 2024-03-25 MED ORDER — TERBINAFINE HCL 250 MG PO TABS
250.0000 mg | ORAL_TABLET | Freq: Every day | ORAL | 2 refills | Status: AC
  Filled 2024-03-25: qty 30, 30d supply, fill #0

## 2024-03-27 ENCOUNTER — Other Ambulatory Visit: Payer: Self-pay

## 2024-03-28 ENCOUNTER — Other Ambulatory Visit: Payer: Self-pay

## 2024-05-02 ENCOUNTER — Other Ambulatory Visit: Payer: Self-pay

## 2024-06-22 ENCOUNTER — Other Ambulatory Visit: Payer: Self-pay

## 2024-06-30 ENCOUNTER — Other Ambulatory Visit: Payer: Self-pay

## 2024-06-30 MED ORDER — GENTAMICIN SULFATE 0.3 % OP SOLN
1.0000 [drp] | Freq: Three times a day (TID) | OPHTHALMIC | 1 refills | Status: AC
  Filled 2024-06-30: qty 5, 10d supply, fill #0

## 2024-07-02 ENCOUNTER — Other Ambulatory Visit: Payer: Self-pay

## 2024-07-02 MED ORDER — CEPHALEXIN 500 MG PO CAPS
500.0000 mg | ORAL_CAPSULE | Freq: Three times a day (TID) | ORAL | 0 refills | Status: AC
  Filled 2024-07-02: qty 21, 7d supply, fill #0

## 2024-07-04 ENCOUNTER — Other Ambulatory Visit: Payer: Self-pay

## 2024-07-04 MED ORDER — POLYMYXIN B-TRIMETHOPRIM 10000-0.1 UNIT/ML-% OP SOLN
1.0000 [drp] | Freq: Four times a day (QID) | OPHTHALMIC | 0 refills | Status: AC
  Filled 2024-07-04: qty 10, 50d supply, fill #0

## 2024-07-04 MED ORDER — FLUCONAZOLE 100 MG PO TABS
100.0000 mg | ORAL_TABLET | Freq: Every day | ORAL | 0 refills | Status: AC
  Filled 2024-07-04: qty 3, 3d supply, fill #0
